# Patient Record
Sex: Male | Born: 1945 | Race: Black or African American | Hispanic: No | Marital: Married | Smoking: Former smoker
Health system: Southern US, Community
[De-identification: ages and names within clinical notes are randomized; demographics above are authoritative.]

## PROBLEM LIST (undated history)

## (undated) DIAGNOSIS — I5022 Chronic systolic (congestive) heart failure: Secondary | ICD-10-CM

## (undated) DIAGNOSIS — M109 Gout, unspecified: Secondary | ICD-10-CM

## (undated) DIAGNOSIS — I482 Chronic atrial fibrillation, unspecified: Secondary | ICD-10-CM

## (undated) DIAGNOSIS — N183 Chronic kidney disease, stage 3 unspecified: Secondary | ICD-10-CM

## (undated) DIAGNOSIS — I10 Essential (primary) hypertension: Secondary | ICD-10-CM

## (undated) DIAGNOSIS — E119 Type 2 diabetes mellitus without complications: Secondary | ICD-10-CM

## (undated) DIAGNOSIS — E785 Hyperlipidemia, unspecified: Secondary | ICD-10-CM

## (undated) DIAGNOSIS — J9621 Acute and chronic respiratory failure with hypoxia: Secondary | ICD-10-CM

## (undated) HISTORY — DX: Essential (primary) hypertension: I10

## (undated) HISTORY — DX: Gout, unspecified: M10.9

## (undated) HISTORY — DX: Hyperlipidemia, unspecified: E78.5

## (undated) HISTORY — DX: Type 2 diabetes mellitus without complications: E11.9

---

## 2015-11-27 DIAGNOSIS — I5020 Unspecified systolic (congestive) heart failure: Secondary | ICD-10-CM | POA: Insufficient documentation

## 2015-11-27 DIAGNOSIS — I10 Essential (primary) hypertension: Secondary | ICD-10-CM | POA: Insufficient documentation

## 2015-11-27 DIAGNOSIS — I2510 Atherosclerotic heart disease of native coronary artery without angina pectoris: Secondary | ICD-10-CM | POA: Diagnosis present

## 2015-11-27 DIAGNOSIS — N183 Chronic kidney disease, stage 3 unspecified: Secondary | ICD-10-CM | POA: Insufficient documentation

## 2015-11-27 DIAGNOSIS — Z95810 Presence of automatic (implantable) cardiac defibrillator: Secondary | ICD-10-CM | POA: Insufficient documentation

## 2015-11-27 DIAGNOSIS — E1122 Type 2 diabetes mellitus with diabetic chronic kidney disease: Secondary | ICD-10-CM | POA: Insufficient documentation

## 2017-12-03 DIAGNOSIS — R609 Edema, unspecified: Secondary | ICD-10-CM | POA: Insufficient documentation

## 2017-12-03 DIAGNOSIS — Z950 Presence of cardiac pacemaker: Secondary | ICD-10-CM | POA: Insufficient documentation

## 2018-07-13 DIAGNOSIS — I4589 Other specified conduction disorders: Secondary | ICD-10-CM | POA: Insufficient documentation

## 2018-07-13 DIAGNOSIS — I5033 Acute on chronic diastolic (congestive) heart failure: Secondary | ICD-10-CM

## 2018-07-13 DIAGNOSIS — I472 Ventricular tachycardia, unspecified: Secondary | ICD-10-CM | POA: Insufficient documentation

## 2018-07-13 DIAGNOSIS — I420 Dilated cardiomyopathy: Secondary | ICD-10-CM | POA: Insufficient documentation

## 2018-07-13 DIAGNOSIS — I4892 Unspecified atrial flutter: Secondary | ICD-10-CM | POA: Diagnosis present

## 2018-07-13 DIAGNOSIS — I255 Ischemic cardiomyopathy: Secondary | ICD-10-CM | POA: Insufficient documentation

## 2018-07-13 DIAGNOSIS — I4891 Unspecified atrial fibrillation: Secondary | ICD-10-CM | POA: Diagnosis present

## 2018-07-13 DIAGNOSIS — T82198A Other mechanical complication of other cardiac electronic device, initial encounter: Secondary | ICD-10-CM | POA: Insufficient documentation

## 2018-09-28 DIAGNOSIS — K047 Periapical abscess without sinus: Secondary | ICD-10-CM | POA: Insufficient documentation

## 2018-09-28 DIAGNOSIS — N183 Chronic kidney disease, stage 3 unspecified: Secondary | ICD-10-CM | POA: Insufficient documentation

## 2020-02-02 LAB — PACEMAKER DEVICE OBSERVATION

## 2020-02-04 DIAGNOSIS — E782 Mixed hyperlipidemia: Secondary | ICD-10-CM | POA: Insufficient documentation

## 2020-02-04 DIAGNOSIS — N493 Fournier gangrene: Secondary | ICD-10-CM | POA: Insufficient documentation

## 2020-02-04 DIAGNOSIS — M726 Necrotizing fasciitis: Secondary | ICD-10-CM | POA: Insufficient documentation

## 2020-02-29 ENCOUNTER — Other Ambulatory Visit (HOSPITAL_COMMUNITY): Payer: Medicare Other

## 2020-02-29 ENCOUNTER — Inpatient Hospital Stay
Admission: RE | Admit: 2020-02-29 | Discharge: 2020-04-20 | Discharge status: Against Medical Advice | Payer: Medicare Other | Source: Other Acute Inpatient Hospital

## 2020-02-29 DIAGNOSIS — J9600 Acute respiratory failure, unspecified whether with hypoxia or hypercapnia: Secondary | ICD-10-CM

## 2020-02-29 DIAGNOSIS — I5022 Chronic systolic (congestive) heart failure: Secondary | ICD-10-CM | POA: Diagnosis present

## 2020-02-29 DIAGNOSIS — N183 Chronic kidney disease, stage 3 unspecified: Secondary | ICD-10-CM | POA: Diagnosis present

## 2020-02-29 DIAGNOSIS — Z452 Encounter for adjustment and management of vascular access device: Secondary | ICD-10-CM

## 2020-02-29 DIAGNOSIS — J90 Pleural effusion, not elsewhere classified: Secondary | ICD-10-CM

## 2020-02-29 DIAGNOSIS — Z431 Encounter for attention to gastrostomy: Secondary | ICD-10-CM

## 2020-02-29 DIAGNOSIS — I482 Chronic atrial fibrillation, unspecified: Secondary | ICD-10-CM | POA: Diagnosis present

## 2020-02-29 DIAGNOSIS — Z9889 Other specified postprocedural states: Secondary | ICD-10-CM

## 2020-02-29 DIAGNOSIS — J189 Pneumonia, unspecified organism: Secondary | ICD-10-CM

## 2020-02-29 DIAGNOSIS — E872 Acidosis, unspecified: Secondary | ICD-10-CM | POA: Insufficient documentation

## 2020-02-29 DIAGNOSIS — E1110 Type 2 diabetes mellitus with ketoacidosis without coma: Secondary | ICD-10-CM | POA: Insufficient documentation

## 2020-02-29 DIAGNOSIS — J9690 Respiratory failure, unspecified, unspecified whether with hypoxia or hypercapnia: Secondary | ICD-10-CM

## 2020-02-29 DIAGNOSIS — J9621 Acute and chronic respiratory failure with hypoxia: Secondary | ICD-10-CM | POA: Diagnosis present

## 2020-02-29 DIAGNOSIS — J939 Pneumothorax, unspecified: Secondary | ICD-10-CM

## 2020-02-29 DIAGNOSIS — T85598A Other mechanical complication of other gastrointestinal prosthetic devices, implants and grafts, initial encounter: Secondary | ICD-10-CM

## 2020-02-29 HISTORY — DX: Chronic atrial fibrillation, unspecified: I48.20

## 2020-02-29 HISTORY — DX: Chronic systolic (congestive) heart failure: I50.22

## 2020-02-29 HISTORY — DX: Chronic kidney disease, stage 3 unspecified: N18.30

## 2020-02-29 HISTORY — DX: Acute and chronic respiratory failure with hypoxia: J96.21

## 2020-02-29 MED ORDER — IOPAMIDOL (ISOVUE-300) INJECTION 61%: 30.0000 mL | Freq: Once | INTRAVENOUS | Status: DC | PRN

## 2020-03-01 LAB — CBC WITH DIFFERENTIAL/PLATELET
Abs Immature Granulocytes: 0.04 10*3/uL (ref 0.00–0.07)
Basophils Absolute: 0 10*3/uL (ref 0.0–0.1)
Basophils Relative: 1 %
Eosinophils Absolute: 0.3 10*3/uL (ref 0.0–0.5)
Eosinophils Relative: 5 %
HCT: 28.1 % — ABNORMAL LOW (ref 39.0–52.0)
Hemoglobin: 8.8 g/dL — ABNORMAL LOW (ref 13.0–17.0)
Immature Granulocytes: 1 %
Lymphocytes Relative: 21 %
Lymphs Abs: 1.2 10*3/uL (ref 0.7–4.0)
MCH: 30.6 pg (ref 26.0–34.0)
MCHC: 31.3 g/dL (ref 30.0–36.0)
MCV: 97.6 fL (ref 80.0–100.0)
Monocytes Absolute: 0.9 10*3/uL (ref 0.1–1.0)
Monocytes Relative: 16 %
Neutro Abs: 3.1 10*3/uL (ref 1.7–7.7)
Neutrophils Relative %: 56 %
Platelets: 403 10*3/uL — ABNORMAL HIGH (ref 150–400)
RBC: 2.88 MIL/uL — ABNORMAL LOW (ref 4.22–5.81)
RDW: 17.5 % — ABNORMAL HIGH (ref 11.5–15.5)
WBC: 5.5 10*3/uL (ref 4.0–10.5)
nRBC: 0 % (ref 0.0–0.2)

## 2020-03-01 LAB — COMPREHENSIVE METABOLIC PANEL
ALT: 28 U/L (ref 0–44)
AST: 30 U/L (ref 15–41)
Albumin: 2.2 g/dL — ABNORMAL LOW (ref 3.5–5.0)
Alkaline Phosphatase: 128 U/L — ABNORMAL HIGH (ref 38–126)
Anion gap: 13 (ref 5–15)
BUN: 28 mg/dL — ABNORMAL HIGH (ref 8–23)
CO2: 21 mmol/L — ABNORMAL LOW (ref 22–32)
Calcium: 9 mg/dL (ref 8.9–10.3)
Chloride: 103 mmol/L (ref 98–111)
Creatinine, Ser: 1.71 mg/dL — ABNORMAL HIGH (ref 0.61–1.24)
GFR, Estimated: 42 mL/min — ABNORMAL LOW (ref >60)
Glucose, Bld: 128 mg/dL — ABNORMAL HIGH (ref 70–99)
Potassium: 3.3 mmol/L — ABNORMAL LOW (ref 3.5–5.1)
Sodium: 137 mmol/L (ref 135–145)
Total Bilirubin: 0.7 mg/dL (ref 0.3–1.2)
Total Protein: 5.8 g/dL — ABNORMAL LOW (ref 6.5–8.1)

## 2020-03-01 LAB — HEMOGLOBIN A1C
Hgb A1c MFr Bld: 9.3 % — ABNORMAL HIGH (ref 4.8–5.6)
Mean Plasma Glucose: 220.21 mg/dL

## 2020-03-04 LAB — CBC
HCT: 27.8 % — ABNORMAL LOW (ref 39.0–52.0)
Hemoglobin: 8.9 g/dL — ABNORMAL LOW (ref 13.0–17.0)
MCH: 31.7 pg (ref 26.0–34.0)
MCHC: 32 g/dL (ref 30.0–36.0)
MCV: 98.9 fL (ref 80.0–100.0)
Platelets: 373 10*3/uL (ref 150–400)
RBC: 2.81 MIL/uL — ABNORMAL LOW (ref 4.22–5.81)
RDW: 19.4 % — ABNORMAL HIGH (ref 11.5–15.5)
WBC: 9.1 10*3/uL (ref 4.0–10.5)
nRBC: 0.9 % — ABNORMAL HIGH (ref 0.0–0.2)

## 2020-03-04 LAB — BASIC METABOLIC PANEL
Anion gap: 9 (ref 5–15)
BUN: 17 mg/dL (ref 8–23)
CO2: 23 mmol/L (ref 22–32)
Calcium: 8.6 mg/dL — ABNORMAL LOW (ref 8.9–10.3)
Chloride: 106 mmol/L (ref 98–111)
Creatinine, Ser: 1.77 mg/dL — ABNORMAL HIGH (ref 0.61–1.24)
GFR, Estimated: 40 mL/min — ABNORMAL LOW (ref >60)
Glucose, Bld: 150 mg/dL — ABNORMAL HIGH (ref 70–99)
Potassium: 4.1 mmol/L (ref 3.5–5.1)
Sodium: 138 mmol/L (ref 135–145)

## 2020-03-07 LAB — CBC
HCT: 28.2 % — ABNORMAL LOW (ref 39.0–52.0)
Hemoglobin: 8.9 g/dL — ABNORMAL LOW (ref 13.0–17.0)
MCH: 31.3 pg (ref 26.0–34.0)
MCHC: 31.6 g/dL (ref 30.0–36.0)
MCV: 99.3 fL (ref 80.0–100.0)
Platelets: 363 10*3/uL (ref 150–400)
RBC: 2.84 MIL/uL — ABNORMAL LOW (ref 4.22–5.81)
RDW: 20.7 % — ABNORMAL HIGH (ref 11.5–15.5)
WBC: 10.8 10*3/uL — ABNORMAL HIGH (ref 4.0–10.5)
nRBC: 0.6 % — ABNORMAL HIGH (ref 0.0–0.2)

## 2020-03-07 LAB — BASIC METABOLIC PANEL
Anion gap: 8 (ref 5–15)
BUN: 17 mg/dL (ref 8–23)
CO2: 24 mmol/L (ref 22–32)
Calcium: 8.3 mg/dL — ABNORMAL LOW (ref 8.9–10.3)
Chloride: 106 mmol/L (ref 98–111)
Creatinine, Ser: 1.68 mg/dL — ABNORMAL HIGH (ref 0.61–1.24)
GFR, Estimated: 43 mL/min — ABNORMAL LOW (ref >60)
Glucose, Bld: 90 mg/dL (ref 70–99)
Potassium: 3.4 mmol/L — ABNORMAL LOW (ref 3.5–5.1)
Sodium: 138 mmol/L (ref 135–145)

## 2020-03-08 LAB — POTASSIUM: Potassium: 3.9 mmol/L (ref 3.5–5.1)

## 2020-03-13 DIAGNOSIS — S31109D Unspecified open wound of abdominal wall, unspecified quadrant without penetration into peritoneal cavity, subsequent encounter: Secondary | ICD-10-CM

## 2020-03-13 LAB — BASIC METABOLIC PANEL
Anion gap: 9 (ref 5–15)
BUN: 11 mg/dL (ref 8–23)
CO2: 24 mmol/L (ref 22–32)
Calcium: 8.8 mg/dL — ABNORMAL LOW (ref 8.9–10.3)
Chloride: 107 mmol/L (ref 98–111)
Creatinine, Ser: 1.76 mg/dL — ABNORMAL HIGH (ref 0.61–1.24)
GFR, Estimated: 41 mL/min — ABNORMAL LOW (ref >60)
Glucose, Bld: 80 mg/dL (ref 70–99)
Potassium: 3.4 mmol/L — ABNORMAL LOW (ref 3.5–5.1)
Sodium: 140 mmol/L (ref 135–145)

## 2020-03-13 LAB — CBC
HCT: 32.7 % — ABNORMAL LOW (ref 39.0–52.0)
Hemoglobin: 10.2 g/dL — ABNORMAL LOW (ref 13.0–17.0)
MCH: 30.7 pg (ref 26.0–34.0)
MCHC: 31.2 g/dL (ref 30.0–36.0)
MCV: 98.5 fL (ref 80.0–100.0)
Platelets: 414 10*3/uL — ABNORMAL HIGH (ref 150–400)
RBC: 3.32 MIL/uL — ABNORMAL LOW (ref 4.22–5.81)
RDW: 19.1 % — ABNORMAL HIGH (ref 11.5–15.5)
WBC: 9.5 10*3/uL (ref 4.0–10.5)
nRBC: 0 % (ref 0.0–0.2)

## 2020-03-13 NOTE — H&P (View-Only) (Signed)
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 He had been transported by EMS to the ER with complaints of penile bleeding, testicular pain, and syncope. EMS noted the patient was hypotensive and confused and had Vtach on the way to the ER. Patient was septic; Lactic Acid 7.8, glucose > 800, anion gap of 30. Patient was started on Zosyn and Vancomycin. CT scan showed fournier gangrene and patient had multiple debridements. Patient required transfusion of packed RBC.  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  No past medical history on file.  No family history on file.  Social History:  has no history on file for tobacco use, alcohol use, and drug use.  Allergies: Not on File  Medications:   Results for orders placed or performed during the hospital encounter of 02/29/20 (from the past 48 hour(s))  CBC     Status: Abnormal   Collection Time: 03/13/20  3:32 AM  Result Value Ref Range   WBC 9.5 4.0 - 10.5 K/uL   RBC 3.32 (L) 4.22 - 5.81 MIL/uL   Hemoglobin 10.2 (L) 13.0 - 17.0 g/dL   HCT 32.7 (L) 39.0 - 52.0 %   MCV 98.5 80.0 - 100.0 fL   MCH 30.7 26.0 - 34.0 pg   MCHC 31.2 30.0 - 36.0 g/dL   RDW 19.1 (H) 11.5 - 15.5 %   Platelets 414 (H) 150 - 400 K/uL   nRBC 0.0 0.0 - 0.2 %    Comment: Performed at [facility], [address] [place], [place] [address]  Basic metabolic panel     Status: Abnormal   Collection Time: 03/13/20  3:32 AM  Result Value Ref Range   Sodium 140 135 - 145 mmol/L   Potassium 3.4 (L) 3.5 - 5.1 mmol/L   Chloride 107 98 - 111 mmol/L   CO2 24 22  - 32 mmol/L   Glucose, Bld 80 70 - 99 mg/dL    Comment: Glucose reference range applies only to samples taken after fasting for at least 8 hours.   BUN 11 8 - 23 mg/dL   Creatinine, Ser 1.76 (H) 0.61 - 1.24 mg/dL   Calcium 8.8 (L) 8.9 - 10.3 mg/dL   GFR, Estimated 41 (L) >60 mL/min    Comment: (NOTE) Calculated using the CKD-EPI Creatinine Equation (2021)    Anion gap 9 5 - 15    Comment: Performed at [facility] [address], [place], [place] [address]    Physical Exam Constitutional:      General: He is not in acute distress. Genitourinary:    Comments: Photos from today's dressing change provided by wound care: Wound extends across groin area including both testes, penis shaft,  perineum and right gluteal fold. Healthy pink tissue covering majority of the wound.  Neurological:     Mental Status: He is alert.     Assessment/Plan: Discussed case with Plastics team. Recommendation to take patient to OR for partial closure and application of dermal matrix or Skin Graft.   Will discuss with Care team and patient. Will need Care team to obtain surgical clearance for patient. Patient will  need to be NPO starting midnight before surgery.   Threasa Heads, PA-C 03/13/2020, 6:45 PM

## 2020-03-13 NOTE — Consult Note (Addendum)
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 He had been transported by EMS to the ER with complaints of penile bleeding, testicular pain, and syncope. EMS noted the patient was hypotensive and confused and had Vtach on the way to the ER. Patient was septic; Lactic Acid 7.8, glucose > 800, anion gap of 30. Patient was started on Zosyn and Vancomycin. CT scan showed fournier gangrene and patient had multiple debridements. Patient required transfusion of packed RBC.  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  No past medical history on file.  No family history on file.  Social History:  has no history on file for tobacco use, alcohol use, and drug use.  Allergies: Not on File  Medications:   Results for orders placed or performed during the hospital encounter of 02/29/20 (from the past 48 hour(s))  CBC     Status: Abnormal   Collection Time: 03/13/20  3:32 AM  Result Value Ref Range   WBC 9.5 4.0 - 10.5 K/uL   RBC 3.32 (L) 4.22 - 5.81 MIL/uL   Hemoglobin 10.2 (L) 13.0 - 17.0 g/dL   HCT 32.7 (L) 39.0 - 52.0 %   MCV 98.5 80.0 - 100.0 fL   MCH 30.7 26.0 - 34.0 pg   MCHC 31.2 30.0 - 36.0 g/dL   RDW 19.1 (H) 11.5 - 15.5 %   Platelets 414 (H) 150 - 400 K/uL   nRBC 0.0 0.0 - 0.2 %    Comment: Performed at [facility], [address], [place], [place] [address]  Basic metabolic panel     Status: Abnormal   Collection Time: 03/13/20  3:32 AM  Result Value Ref Range   Sodium 140 135 - 145 mmol/L   Potassium 3.4 (L) 3.5 - 5.1 mmol/L   Chloride 107 98 - 111 mmol/L   CO2 24 22  - 32 mmol/L   Glucose, Bld 80 70 - 99 mg/dL    Comment: Glucose reference range applies only to samples taken after fasting for at least 8 hours.   BUN 11 8 - 23 mg/dL   Creatinine, Ser 1.76 (H) 0.61 - 1.24 mg/dL   Calcium 8.8 (L) 8.9 - 10.3 mg/dL   GFR, Estimated 41 (L) >60 mL/min    Comment: (NOTE) Calculated using the CKD-EPI Creatinine Equation (2021)    Anion gap 9 5 - 15    Comment: Performed at [facility] [address], [place], [place] [address]    Physical Exam Constitutional:      General: He is not in acute distress. Genitourinary:    Comments: Photos from today's dressing change provided by wound care: Wound extends across groin area including both testes, penis shaft,  perineum and right gluteal fold. Healthy pink tissue covering majority of the wound.  Neurological:     Mental Status: He is alert.     Assessment/Plan: Discussed case with Plastics team. Recommendation to take patient to OR for partial closure and application of dermal matrix or Skin Graft.   Will discuss with Care team and patient. Will need Care team to obtain surgical clearance for patient. Patient will  need to be NPO starting midnight before surgery.   Threasa Heads, PA-C 03/13/2020, 6:45 PM

## 2020-03-15 NOTE — Progress Notes (Signed)
  Subjective: Patient is a 74 year old male who was admitted to the [facility] on 02/29/2020 with septic shock secondary to Fournier's gangrene.  Patient reports today that he is a diabetic, reports that his A1c was around 6.5 and therefore he stopped taking his insulin.  Most recent A1c 9.3.  Patient has significant past medical history of CKD stage III, DM with A1c of 9.3, hypertension, hyperlipidemia, CAD with stent placement and ischemic dilated cardiomyopathy.  Objective: Vital signs in last 24 hours: BP: ()/()  Arterial Line BP: ()/()     Intake/Output from previous day: No intake/output data recorded. Intake/Output this shift: No intake/output data recorded.  General appearance: alert, cooperative, no distress and Elderly male resting in bed Head: Normocephalic, without obvious abnormality, atraumatic Resp: Unlabored Incision/Wound: Did not evaluate patient's wound today  Lab Results:  CBC Latest Ref Rng & Units 03/13/2020 03/07/2020 03/04/2020  WBC 4.0 - 10.5 K/uL 9.5 10.8(H) 9.1  Hemoglobin 13.0 - 17.0 g/dL 10.2(L) 8.9(L) 8.9(L)  Hematocrit 39.0 - 52.0 % 32.7(L) 28.2(L) 27.8(L)  Platelets 150 - 400 K/uL 414(H) 363 373    BMET Recent Labs    03/13/20 0332  NA 140  K 3.4*  CL 107  CO2 24  GLUCOSE 80  BUN 11  CREATININE 1.76*  CALCIUM 8.8*   PT/INR No results for input(s): LABPROT, INR in the last 72 hours. ABG No results for input(s): PHART, HCO3 in the last 72 hours.  Invalid input(s): PCO2, PO2  Studies/Results: No results found.  Anti-infectives: Anti-infectives (From admission, onward)   None      Assessment/Plan: Fournier grangrene/scrotal wound:  Discussed surgical options with patient, including wound debridement, placement of wound VAC, partial primary closure, application of wound matrix, split-thickness skin graft.  Discussed the surgical plan with patient and his wife.  Discussed the risks associated with each procedure  including but not limited to bleeding, pain, infection, risks of anesthesia.  All of the patient's questions were answered to his satisfaction.  Discussed with the primary team Dr. Laren Everts and nursing staff that we would require surgical clearance prior to any surgical intervention.  Discussed with them that we would also determine intraoperatively the best plan of action, however I did discuss with them the possibilities including debridement, primary partial closure, application of wound matrix, split-thickness skin graft.    Patient states that he would like to think it over, he would like to talk with his wife more.  I recommend he call us with any questions or concerns.  I discussed with him that we will check in next week to follow-up.  I discussed with him that in the meantime we would plan a date for his surgical intervention and adjust as needed.  DVT prophylaxis per primary team, FEN Per primary team. Will update patient and primary team with surgical date once it has been set.     LOS: 0 days    Charlies Constable, PA-C 03/15/2020

## 2020-03-18 LAB — CBC
HCT: 32.8 % — ABNORMAL LOW (ref 39.0–52.0)
Hemoglobin: 10.2 g/dL — ABNORMAL LOW (ref 13.0–17.0)
MCH: 30.6 pg (ref 26.0–34.0)
MCHC: 31.1 g/dL (ref 30.0–36.0)
MCV: 98.5 fL (ref 80.0–100.0)
Platelets: 392 10*3/uL (ref 150–400)
RBC: 3.33 MIL/uL — ABNORMAL LOW (ref 4.22–5.81)
RDW: 18.2 % — ABNORMAL HIGH (ref 11.5–15.5)
WBC: 8.7 10*3/uL (ref 4.0–10.5)
nRBC: 0 % (ref 0.0–0.2)

## 2020-03-18 LAB — BASIC METABOLIC PANEL
Anion gap: 8 (ref 5–15)
BUN: 14 mg/dL (ref 8–23)
CO2: 26 mmol/L (ref 22–32)
Calcium: 8.9 mg/dL (ref 8.9–10.3)
Chloride: 105 mmol/L (ref 98–111)
Creatinine, Ser: 1.66 mg/dL — ABNORMAL HIGH (ref 0.61–1.24)
GFR, Estimated: 44 mL/min — ABNORMAL LOW (ref >60)
Glucose, Bld: 60 mg/dL — ABNORMAL LOW (ref 70–99)
Potassium: 3.9 mmol/L (ref 3.5–5.1)
Sodium: 139 mmol/L (ref 135–145)

## 2020-03-18 LAB — URIC ACID: Uric Acid, Serum: 6.5 mg/dL (ref 3.7–8.6)

## 2020-03-20 NOTE — Progress Notes (Signed)
  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  PMH significant for CKD stage III, DM with recent A1c of 9.3, HTN, HLD, CAD with stent placement and ischemic dilated cardiomyopathy..  Objective: Vital signs in last 24 hours: BP: ()/()  Arterial Line BP: ()/()     Intake/Output from previous day: No intake/output data recorded. Intake/Output this shift: No intake/output data recorded.  General appearance: alert, cooperative and no distress Head: Normocephalic, without obvious abnormality, atraumatic Eyes: EOMs intact Resp: nonlabored Incision/Wound: Did not evaluate patient's wound today  Lab Results:  CBC    Component Value Date/Time   WBC 8.7 03/18/2020 0527   RBC 3.33 (L) 03/18/2020 0527   HGB 10.2 (L) 03/18/2020 0527   HCT 32.8 (L) 03/18/2020 0527   PLT 392 03/18/2020 0527   MCV 98.5 03/18/2020 0527   MCH 30.6 03/18/2020 0527   MCHC 31.1 03/18/2020 0527   RDW 18.2 (H) 03/18/2020 0527   LYMPHSABS 1.2 03/01/2020 0442   MONOABS 0.9 03/01/2020 0442   EOSABS 0.3 03/01/2020 0442   BASOSABS 0.0 03/01/2020 0442   BMET Recent Labs    03/18/20 0527  NA 139  K 3.9  CL 105  CO2 26  GLUCOSE 60*  BUN 14  CREATININE 1.66*  CALCIUM 8.9   PT/INR No results for input(s): LABPROT, INR in the last 72 hours. ABG No results for input(s): PHART, HCO3 in the last 72 hours.  Invalid input(s): PCO2, PO2  Studies/Results: No results found.  Anti-infectives: Anti-infectives (From admission, onward)   None      Assessment/Plan: s/p  Patient states that he and his wife are in agreement for moving forward with surgery to address his groin wound. He would like to be placed on the schedule.  We will begin working to schedule him and update his care team accordingly. Patient will need to be NPO at midnight before surgery. Care team will need to obtain appropriate surgical  clearance for patient.   LOS: 0 days    Threasa Heads, PA-C 03/20/2020

## 2020-03-25 ENCOUNTER — Encounter (HOSPITAL_COMMUNITY): Payer: Self-pay | Admitting: Anesthesiology

## 2020-03-25 LAB — PROTIME-INR
INR: 1 (ref 0.8–1.2)
Prothrombin Time: 13.1 seconds (ref 11.4–15.2)

## 2020-03-25 LAB — BASIC METABOLIC PANEL
Anion gap: 9 (ref 5–15)
BUN: 20 mg/dL (ref 8–23)
CO2: 23 mmol/L (ref 22–32)
Calcium: 8.9 mg/dL (ref 8.9–10.3)
Chloride: 110 mmol/L (ref 98–111)
Creatinine, Ser: 1.58 mg/dL — ABNORMAL HIGH (ref 0.61–1.24)
GFR, Estimated: 46 mL/min — ABNORMAL LOW (ref >60)
Glucose, Bld: 73 mg/dL (ref 70–99)
Potassium: 3.2 mmol/L — ABNORMAL LOW (ref 3.5–5.1)
Sodium: 142 mmol/L (ref 135–145)

## 2020-03-25 LAB — CBC
HCT: 34.3 % — ABNORMAL LOW (ref 39.0–52.0)
Hemoglobin: 11.2 g/dL — ABNORMAL LOW (ref 13.0–17.0)
MCH: 30.8 pg (ref 26.0–34.0)
MCHC: 32.7 g/dL (ref 30.0–36.0)
MCV: 94.2 fL (ref 80.0–100.0)
Platelets: 364 10*3/uL (ref 150–400)
RBC: 3.64 MIL/uL — ABNORMAL LOW (ref 4.22–5.81)
RDW: 16.6 % — ABNORMAL HIGH (ref 11.5–15.5)
WBC: 8.4 10*3/uL (ref 4.0–10.5)
nRBC: 0 % (ref 0.0–0.2)

## 2020-03-25 NOTE — Anesthesia Preprocedure Evaluation (Deleted)
Anesthesia Evaluation    Reviewed: Allergy & Precautions, Patient's Chart, lab work & pertinent test results  History of Anesthesia Complications Negative for: history of anesthetic complications  Airway        Dental   Pulmonary neg pulmonary ROS,           Cardiovascular + CAD, + Cardiac Stents and +CHF  + dysrhythmias Atrial Fibrillation   TTE 02/12/20: study quality technically difficult, moderately dilated LV, anteroapical wall appears thin and akinetic, EF 25-36%, diastolic dysfunction.     Neuro/Psych negative neurological ROS  negative psych ROS   GI/Hepatic negative GI ROS, Neg liver ROS,   Endo/Other  diabetes, Type 2  Renal/GU Renal InsufficiencyRenal disease  negative genitourinary   Musculoskeletal negative musculoskeletal ROS (+)   Abdominal   Peds  Hematology negative hematology ROS (+)   Anesthesia Other Findings fourniers gangrene  Reproductive/Obstetrics negative OB ROS                            Anesthesia Physical Anesthesia Plan  ASA: III  Anesthesia Plan: General   Post-op Pain Management:    Induction: Intravenous  PONV Risk Score and Plan: 3 and Treatment may vary due to age or medical condition and Ondansetron  Airway Management Planned: Oral ETT  Additional Equipment: Arterial line  Intra-op Plan:   Post-operative Plan: Extubation in OR  Informed Consent:   Plan Discussed with:   Anesthesia Plan Comments:        Anesthesia Quick Evaluation

## 2020-03-26 ENCOUNTER — Encounter: Admission: RE | Discharge status: Against Medical Advice | Payer: Self-pay | Source: Other Acute Inpatient Hospital

## 2020-03-26 LAB — POTASSIUM: Potassium: 3.5 mmol/L (ref 3.5–5.1)

## 2020-03-26 LAB — SARS CORONAVIRUS 2 (TAT 6-24 HRS): SARS Coronavirus 2: NEGATIVE

## 2020-03-26 SURGERY — WOUND EXPLORATION
Anesthesia: General | Site: Groin | Laterality: Bilateral

## 2020-03-26 NOTE — Progress Notes (Signed)
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  He has a significant groin wound which they have been treating with local wound care.  Patient was scheduled for surgery today, however patient was not n.p.o. and family had some questions about surgery and therefore it was postponed.  Today on evaluation, patient is resting in bed, no complaints, wife on phone.  Nursing staff at bedside.  Objective: Vital signs in last 24 hours: BP: ()/()  Arterial Line BP: ()/()     Intake/Output from previous day: No intake/output data recorded. Intake/Output this shift: No intake/output data recorded.  General appearance: alert, cooperative, no distress and Sitting up in bed, resting, comfortable Head: Normocephalic, without obvious abnormality, atraumatic Respiratory: Unlabored. Incision/Wound: Did not evaluate groin wound today  Lab Results:  CBC Latest Ref Rng & Units 03/25/2020 03/18/2020 03/13/2020  WBC 4.0 - 10.5 K/uL 8.4 8.7 9.5  Hemoglobin 13.0 - 17.0 g/dL 11.2(L) 10.2(L) 10.2(L)  Hematocrit 39.0 - 52.0 % 34.3(L) 32.8(L) 32.7(L)  Platelets 150 - 400 K/uL 364 392 414(H)    BMET Recent Labs    03/25/20 0354 03/26/20 0458  NA 142  --   K 3.2* 3.5  CL 110  --   CO2 23  --   GLUCOSE 73  --   BUN 20  --   CREATININE 1.58*  --   CALCIUM 8.9  --    PT/INR Recent Labs    03/25/20 0354  LABPROT 13.1  INR 1.0   ABG No results for input(s): PHART, HCO3 in the last 72 hours.  Invalid input(s): PCO2, PO2  Studies/Results: No results found.  Anti-infectives: Anti-infectives (From admission, onward)   None      Assessment/Plan:  Patient is scheduled for surgery tomorrow with Dr. Claudia Desanctis: Exploration bilateral groin wounds with partial closure, possible application of PRIMATRIX, possible split-thickness skin graft  N.p.o. after midnight, discussed this  [facility].  Patient is aware to be n.p.o. after midnight.  Primary team to obtain clearance prior to surgical intervention.  Discussed holding Lovenox dose this afternoon and tomorrow [facility]  We discussed the risks associated with the scheduled surgery including but not limited to bleeding, infection, poor healing, cardiovascular and pulmonary complications, prolonged wound healing.  We discussed his personal risk factors for poor wound healing including but not limited to his elevated A1c.  All the patient and family members questions were answered to their content.  They stated they are prepared to move forward with surgery.  I recommend they call us with any questions or concerns.    LOS: 0 days    Charlies Constable, PA-C 03/26/2020

## 2020-03-27 ENCOUNTER — Encounter (HOSPITAL_COMMUNITY): Payer: Self-pay

## 2020-03-27 ENCOUNTER — Encounter (HOSPITAL_COMMUNITY): Payer: Medicare Other | Admitting: Certified Registered"

## 2020-03-27 ENCOUNTER — Encounter: Admission: RE | Discharge status: Against Medical Advice | Payer: Self-pay | Source: Other Acute Inpatient Hospital

## 2020-03-27 DIAGNOSIS — S31501A Unspecified open wound of unspecified external genital organs, male, initial encounter: Secondary | ICD-10-CM

## 2020-03-27 HISTORY — PX: WOUND EXPLORATION: SHX6188

## 2020-03-27 HISTORY — PX: SKIN SPLIT GRAFT: SHX444

## 2020-03-27 LAB — GLUCOSE, CAPILLARY
Glucose-Capillary: 80 mg/dL (ref 70–99)
Glucose-Capillary: 61 mg/dL — ABNORMAL LOW (ref 70–99)
Glucose-Capillary: 105 mg/dL — ABNORMAL HIGH (ref 70–99)
Glucose-Capillary: 78 mg/dL (ref 70–99)

## 2020-03-27 SURGERY — WOUND EXPLORATION
Anesthesia: General | Site: Groin | Laterality: Bilateral

## 2020-03-27 MED ORDER — BUPIVACAINE HCL (PF) 0.25 % IJ SOLN
INTRAMUSCULAR | Status: AC
  Filled 2020-03-27: qty 30

## 2020-03-27 MED ORDER — MINERAL OIL LIGHT 100 % EX OIL
TOPICAL_OIL | CUTANEOUS | Status: DC | PRN
  Administered 2020-03-27: 1 via TOPICAL

## 2020-03-27 MED ORDER — 0.9 % SODIUM CHLORIDE (POUR BTL) OPTIME
TOPICAL | Status: DC | PRN
  Administered 2020-03-27: 1000 mL

## 2020-03-27 MED ORDER — FENTANYL CITRATE (PF) 250 MCG/5ML IJ SOLN
INTRAMUSCULAR | Status: AC
  Filled 2020-03-27: qty 5

## 2020-03-27 MED ORDER — LACTATED RINGERS IV SOLN: INTRAVENOUS | Status: DC

## 2020-03-27 MED ORDER — CEFAZOLIN SODIUM-DEXTROSE 2-4 GM/100ML-% IV SOLN
2.0000 g | INTRAVENOUS | Status: AC
  Administered 2020-03-27: 2 g via INTRAVENOUS
  Filled 2020-03-27: qty 100

## 2020-03-27 MED ORDER — ORAL CARE MOUTH RINSE: 15.0000 mL | Freq: Once | OROMUCOSAL | Status: AC

## 2020-03-27 MED ORDER — PROPOFOL 10 MG/ML IV BOLUS
INTRAVENOUS | Status: AC
  Filled 2020-03-27: qty 20

## 2020-03-27 MED ORDER — FENTANYL CITRATE (PF) 100 MCG/2ML IJ SOLN: 25.0000 ug | INTRAMUSCULAR | Status: DC | PRN

## 2020-03-27 MED ORDER — PROMETHAZINE HCL 25 MG/ML IJ SOLN: 6.2500 mg | INTRAMUSCULAR | Status: DC | PRN

## 2020-03-27 MED ORDER — LIDOCAINE 2% (20 MG/ML) 5 ML SYRINGE
INTRAMUSCULAR | Status: DC | PRN
  Administered 2020-03-27: 40 mg via INTRAVENOUS

## 2020-03-27 MED ORDER — PROPOFOL 10 MG/ML IV BOLUS
INTRAVENOUS | Status: DC | PRN
  Administered 2020-03-27: 120 mg via INTRAVENOUS

## 2020-03-27 MED ORDER — FENTANYL CITRATE (PF) 100 MCG/2ML IJ SOLN
INTRAMUSCULAR | Status: DC | PRN
  Administered 2020-03-27 (×5): 25 ug via INTRAVENOUS

## 2020-03-27 MED ORDER — OXYCODONE HCL 5 MG PO TABS: 5.0000 mg | ORAL_TABLET | Freq: Once | ORAL | Status: DC | PRN

## 2020-03-27 MED ORDER — EPHEDRINE SULFATE 50 MG/ML IJ SOLN
INTRAMUSCULAR | Status: DC | PRN
  Administered 2020-03-27: 5 mg via INTRAVENOUS

## 2020-03-27 MED ORDER — EPINEPHRINE PF 1 MG/ML IJ SOLN
INTRAMUSCULAR | Status: AC
  Filled 2020-03-27: qty 1

## 2020-03-27 MED ORDER — FENTANYL CITRATE (PF) 100 MCG/2ML IJ SOLN
INTRAMUSCULAR | Status: AC
  Filled 2020-03-27: qty 2

## 2020-03-27 MED ORDER — CHLORHEXIDINE GLUCONATE 0.12 % MT SOLN
15.0000 mL | Freq: Once | OROMUCOSAL | Status: AC
  Administered 2020-03-27: 15 mL via OROMUCOSAL
  Filled 2020-03-27: qty 15

## 2020-03-27 MED ORDER — ONDANSETRON HCL 4 MG/2ML IJ SOLN: 4.0000 mg | Freq: Once | INTRAMUSCULAR | Status: DC | PRN

## 2020-03-27 MED ORDER — PHENYLEPHRINE HCL-NACL 10-0.9 MG/250ML-% IV SOLN
INTRAVENOUS | Status: DC | PRN
  Administered 2020-03-27: 30 ug/min via INTRAVENOUS

## 2020-03-27 MED ORDER — OXYCODONE HCL 5 MG/5ML PO SOLN: 5.0000 mg | Freq: Once | ORAL | Status: DC | PRN

## 2020-03-27 MED ORDER — MINERAL OIL LIGHT OIL
TOPICAL_OIL | Freq: Once | Status: DC
  Filled 2020-03-27: qty 10

## 2020-03-27 MED ORDER — PHENYLEPHRINE HCL (PRESSORS) 10 MG/ML IV SOLN
INTRAVENOUS | Status: DC | PRN
  Administered 2020-03-27 (×7): 80 ug via INTRAVENOUS

## 2020-03-27 MED ORDER — ONDANSETRON HCL 4 MG/2ML IJ SOLN
INTRAMUSCULAR | Status: DC | PRN
  Administered 2020-03-27: 4 mg via INTRAVENOUS

## 2020-03-27 MED ORDER — SODIUM CHLORIDE 0.9 % IV SOLN
INTRAVENOUS | Status: DC | PRN
  Administered 2020-03-27: 1031 mL

## 2020-03-27 MED ORDER — DEXTROSE 50 % IV SOLN
INTRAVENOUS | Status: DC | PRN
  Administered 2020-03-27: 12.5 g via INTRAVENOUS

## 2020-03-27 MED ORDER — FENTANYL CITRATE (PF) 100 MCG/2ML IJ SOLN
25.0000 ug | INTRAMUSCULAR | Status: DC | PRN
  Administered 2020-03-27 (×2): 25 ug via INTRAVENOUS

## 2020-03-27 SURGICAL SUPPLY — 61 items
BAG DECANTER FOR FLEXI CONT (MISCELLANEOUS) ×2 IMPLANT
BLADE CLIPPER SURG (BLADE) IMPLANT
BLADE DERMATOME SS (BLADE) ×2 IMPLANT
BNDG ELASTIC 4X5.8 VLCR STR LF (GAUZE/BANDAGES/DRESSINGS) IMPLANT
BNDG ELASTIC 6X5.8 VLCR STR LF (GAUZE/BANDAGES/DRESSINGS) IMPLANT
BNDG GAUZE ELAST 4 BULKY (GAUZE/BANDAGES/DRESSINGS) IMPLANT
BRUSH SCRUB EZ PLAIN DRY (MISCELLANEOUS) ×18 IMPLANT
CANISTER SUCT 3000ML PPV (MISCELLANEOUS) IMPLANT
CANISTER WOUND CARE 500ML ATS (WOUND CARE) IMPLANT
COVER SURGICAL LIGHT HANDLE (MISCELLANEOUS) ×2 IMPLANT
DERMACARRIERS GRAFT 1 TO 1.5 (DISPOSABLE) ×4
DRAPE EXTREMITY T 121X128X90 (DISPOSABLE) IMPLANT
DRAPE HALF SHEET 40X57 (DRAPES) ×2 IMPLANT
DRAPE INCISE IOBAN 66X45 STRL (DRAPES) ×2 IMPLANT
DRAPE ORTHO SPLIT 77X108 STRL (DRAPES) ×4
DRAPE SURG ORHT 6 SPLT 77X108 (DRAPES) ×2 IMPLANT
DRSG CALCIUM ALGINATE 4X4 (GAUZE/BANDAGES/DRESSINGS) ×14 IMPLANT
DRSG MEPITEL 4X7.2 (GAUZE/BANDAGES/DRESSINGS) IMPLANT
DRSG OPSITE 6X11 MED (GAUZE/BANDAGES/DRESSINGS) IMPLANT
DRSG PAD ABDOMINAL 8X10 ST (GAUZE/BANDAGES/DRESSINGS) ×8 IMPLANT
DRSG VAC ATS LRG SENSATRAC (GAUZE/BANDAGES/DRESSINGS) IMPLANT
DRSG VAC ATS MED SENSATRAC (GAUZE/BANDAGES/DRESSINGS) IMPLANT
DRSG VAC ATS SM SENSATRAC (GAUZE/BANDAGES/DRESSINGS) IMPLANT
ELECT REM PT RETURN 9FT ADLT (ELECTROSURGICAL) ×2
ELECTRODE REM PT RTRN 9FT ADLT (ELECTROSURGICAL) ×1 IMPLANT
FILTER STRAW FLUID ASPIR (MISCELLANEOUS) ×2 IMPLANT
GAUZE SPONGE 4X4 12PLY STRL (GAUZE/BANDAGES/DRESSINGS) ×8 IMPLANT
GAUZE XEROFORM 5X9 LF (GAUZE/BANDAGES/DRESSINGS) ×14 IMPLANT
GLOVE BIOGEL M STRL SZ7.5 (GLOVE) ×4 IMPLANT
GLOVE INDICATOR 8.0 STRL GRN (GLOVE) ×4 IMPLANT
GOWN STRL REUS W/ TWL LRG LVL3 (GOWN DISPOSABLE) ×2 IMPLANT
GOWN STRL REUS W/TWL LRG LVL3 (GOWN DISPOSABLE) ×4
GRAFT DERMACARRIERS 1 TO 1.5 (DISPOSABLE) ×2 IMPLANT
HANDPIECE INTERPULSE COAX TIP (DISPOSABLE)
IV NS 1000ML (IV SOLUTION) ×2
IV NS 1000ML BAXH (IV SOLUTION) ×1 IMPLANT
KIT BASIN OR (CUSTOM PROCEDURE TRAY) ×2 IMPLANT
KIT TURNOVER KIT B (KITS) ×2 IMPLANT
MANIFOLD NEPTUNE II (INSTRUMENTS) ×2 IMPLANT
NEEDLE 18GX1X1/2 (RX/OR ONLY) (NEEDLE) ×2 IMPLANT
NEEDLE FILTER BLUNT 18X 1/2SAF (NEEDLE) ×1
NEEDLE FILTER BLUNT 18X1 1/2 (NEEDLE) ×1 IMPLANT
NEEDLE SPNL 18GX3.5 QUINCKE PK (NEEDLE) ×2 IMPLANT
NS IRRIG 1000ML POUR BTL (IV SOLUTION) ×2 IMPLANT
PACK GENERAL/GYN (CUSTOM PROCEDURE TRAY) ×2 IMPLANT
PAD ARMBOARD 7.5X6 YLW CONV (MISCELLANEOUS) ×4 IMPLANT
SET HNDPC FAN SPRY TIP SCT (DISPOSABLE) IMPLANT
STAPLER VISISTAT 35W (STAPLE) IMPLANT
SUT CHROMIC 4 0 PS 2 18 (SUTURE) IMPLANT
SUT ETHILON 3 0 PS 1 (SUTURE) ×18 IMPLANT
SUT PDS AB 3-0 SH 27 (SUTURE) IMPLANT
SUT VIC AB 3-0 PS1 18 (SUTURE) ×4
SUT VIC AB 3-0 PS1 18XBRD (SUTURE) ×2 IMPLANT
SUT VIC AB 3-0 PS2 18 (SUTURE) ×38 IMPLANT
SYR 30ML LL (SYRINGE) ×2 IMPLANT
SYR 50ML LL SCALE MARK (SYRINGE) ×6 IMPLANT
SYR CONTROL 10ML LL (SYRINGE) ×2 IMPLANT
SYR TB 1ML LUER SLIP (SYRINGE) ×2 IMPLANT
TOWEL GREEN STERILE (TOWEL DISPOSABLE) ×2 IMPLANT
TOWEL GREEN STERILE FF (TOWEL DISPOSABLE) ×2 IMPLANT
UNDERPAD 30X36 HEAVY ABSORB (UNDERPADS AND DIAPERS) ×2 IMPLANT

## 2020-03-27 NOTE — Anesthesia Preprocedure Evaluation (Addendum)
Anesthesia Evaluation  Patient identified by MRN, date of birth, ID band Patient awake    Reviewed: Allergy & Precautions, NPO status , Patient's Chart, lab work & pertinent test results  Airway Mallampati: II  TM Distance: >3 FB     Dental  (+) Teeth Intact, Dental Advisory Given,    Pulmonary    breath sounds clear to auscultation       Cardiovascular  Rhythm:Irregular Rate:Normal     Neuro/Psych    GI/Hepatic   Endo/Other    Renal/GU      Musculoskeletal   Abdominal   Peds  Hematology   Anesthesia Other Findings   Reproductive/Obstetrics                             Anesthesia Physical Anesthesia Plan  ASA: III  Anesthesia Plan: General   Post-op Pain Management:    Induction: Intravenous  PONV Risk Score and Plan: Ondansetron  Airway Management Planned: Oral ETT  Additional Equipment:   Intra-op Plan:   Post-operative Plan: Extubation in OR  Informed Consent: I have reviewed the patients History and Physical, chart, labs and discussed the procedure including the risks, benefits and alternatives for the proposed anesthesia with the patient or authorized representative who has indicated his/her understanding and acceptance.     Dental advisory given  Plan Discussed with: CRNA and Anesthesiologist  Anesthesia Plan Comments:         Anesthesia Quick Evaluation

## 2020-03-27 NOTE — Anesthesia Postprocedure Evaluation (Signed)
Anesthesia Post Note  Patient: Charles Mcdonald  Procedure(s) Performed: Exploration bilateral groin wounds with partial closure (Bilateral Groin) SKIN GRAFT SPLIT THICKNESS (Bilateral Groin)     Patient location during evaluation: PACU Anesthesia Type: General Level of consciousness: awake and alert Pain management: pain level controlled Vital Signs Assessment: post-procedure vital signs reviewed and stable Respiratory status: spontaneous breathing, nonlabored ventilation, respiratory function stable and patient connected to nasal cannula oxygen Cardiovascular status: blood pressure returned to baseline and stable Postop Assessment: no apparent nausea or vomiting Anesthetic complications: no   No complications documented.  Last Vitals:  Vitals:   03/27/20 1725  BP: (!) 131/96  Pulse: 100  Resp: 12  Temp: 36.4 C  SpO2: 100%    Last Pain: There were no vitals filed for this visit.               Barnet Glasgow

## 2020-03-27 NOTE — Transfer of Care (Signed)
Immediate Anesthesia Transfer of Care Note  Patient: Charles Mcdonald  Procedure(s) Performed: Exploration bilateral groin wounds with partial closure (Bilateral Groin) SKIN GRAFT SPLIT THICKNESS (Bilateral Groin)  Patient Location: PACU  Anesthesia Type:General  Level of Consciousness: awake, alert  and oriented  Airway & Oxygen Therapy: Patient Spontanous Breathing  Post-op Assessment: Report given to RN and Post -op Vital signs reviewed and stable  Post vital signs: Reviewed and stable  Last Vitals:  Vitals Value Taken Time  BP 131/96 03/27/20 1724  Temp 97.2   Pulse 102 03/27/20 1729  Resp 14 03/27/20 1729  SpO2 92 % 03/27/20 1729  Vitals shown include unvalidated device data.  Glu 76 - PACU RN will give patient juice  Last Pain: There were no vitals filed for this visit.       Complications: No complications documented.

## 2020-03-27 NOTE — Op Note (Signed)
Operative Note   DATE OF OPERATION: 03/27/2020  SURGICAL DEPARTMENT: Plastic Surgery  PREOPERATIVE DIAGNOSES: Bilateral groin, genital and perineal wounds  POSTOPERATIVE DIAGNOSES:  same  PROCEDURE: 1.  Surgical preparation for grafting of bilateral groin, genital and perineal wounds totaling 30 x 27 cm 2.  Split-thickness skin graft to bilateral groin, genital and perineal wounds totaling 30 x 27 cm  SURGEON: Talmadge Coventry, MD  ASSISTANT: Verdie Shire, PA The advanced practice practitioner (APP) assisted throughout the case.  The APP was essential in retraction and counter traction when needed to make the case progress smoothly.  This retraction and assistance made it possible to see the tissue plans for the procedure.  The assistance was needed for blood control, tissue re-approximation and assisted with closure of the incision site.  ANESTHESIA:  General.   COMPLICATIONS: None.   INDICATIONS FOR PROCEDURE:  The patient, Charles Mcdonald is a 75 y.o. male born on 1945-07-31, is here for treatment of wounds from Fournier's gangrene debridement MRN: [id number]  CONSENT:  Informed consent was obtained directly from the patient. Risks, benefits and alternatives were fully discussed. Specific risks including but not limited to bleeding, infection, hematoma, seroma, scarring, pain, contracture, asymmetry, wound healing problems, and need for further surgery were all discussed. The patient did have an ample opportunity to have questions answered to satisfaction.   DESCRIPTION OF PROCEDURE:  The patient was taken to the operating room. SCDs were placed and antibiotics were given.  General anesthesia was administered.  The patient's operative site was prepped and draped in a sterile fashion. A time out was performed and all information was confirmed to be correct.  Started by evaluating the wounds.  There were bilateral groin wounds with healthy granulation tissue.  Both testicles were  totally degloved as was the majority of the shaft of the penis.  The wounds extended back along the groin crease into the perineum.  The total about 30 x 27 cm in size and consisted of pretty much entirely healthy granulation tissue at the base.  I debrided the area with the bevel of the 10 blade to remove all colonized granulation tissue and irrigated copiously with saline irrigation.  Any nonviable tissue was debrided sharply with pickups and scissors.  At this point I felt the wound was ready for skin grafting.  Tumescent solution was infiltrated in both thighs and given time to work.  Split-thickness skin graft was harvested with a dermatome at 14 thousandths of an inch.  It was meshed 1.5-1.  It was secured to the wounds with 3-0 Vicryl sutures.  Xeroform, scrub brush sponges, and 3-0 nylon's were used to fashion a bolster dressing.  The only open area is now posteriorly on the right side and is about 6 cm in largest dimension.  Both eyes were wrapped and the groin was covered with gauze and a mesh underwear.  The patient tolerated the procedure well.  There were no complications. The patient was allowed to wake from anesthesia, extubated and taken to the recovery room in satisfactory condition.

## 2020-03-27 NOTE — Anesthesia Procedure Notes (Signed)
Procedure Name: LMA Insertion Date/Time: 03/27/2020 2:35 PM Performed by: Babs Bertin, CRNA Pre-anesthesia Checklist: Patient identified, Emergency Drugs available, Suction available and Patient being monitored Patient Re-evaluated:Patient Re-evaluated prior to induction Oxygen Delivery Method: Circle System Utilized Preoxygenation: Pre-oxygenation with 100% oxygen Induction Type: IV induction Ventilation: Mask ventilation without difficulty LMA: LMA inserted LMA Size: 4.0 Number of attempts: 1 Airway Equipment and Method: Bite block Placement Confirmation: positive ETCO2 Tube secured with: Tape Dental Injury: Teeth and Oropharynx as per pre-operative assessment

## 2020-03-27 NOTE — Progress Notes (Addendum)
Patient is a 75 year old male status post split-thickness skin graft to bilateral groin wounds, genital and perineal wounds with Dr. Claudia Desanctis today 03/27/2020.  Patient has bolsters in place over groin and perineum.  He also has a wet-to-dry dressing on the posterior aspect of his perineum.  - Recommend optimizing nutritional status via protein shakes.  May benefit from multivitamin. - Recommend leaving bolsters in place for 1 week, plastic surgery team will remove in 1 week to evaluate graft take. - Recommend nursing staff to assist with wet-to-dry dressing changes to posterior perineum - Daily. Cover with 4x4 gauze, ABD and secure with medipore tape.  - Recommend remaining bedbound for 3 days to decrease risk of graft shearing - can begin ambulating/transferring to bedside chair/commode as able on 03/31/2020. - Recommend restarting Lovenox tomorrow morning 03/28/2020, dosing per primary team, 03/28/2020.  Recommend calling with any questions or concerns, we will continue to follow patient.

## 2020-03-27 NOTE — Brief Op Note (Signed)
03/27/2020  5:07 PM  PATIENT:  Charles Mcdonald  75 y.o. male  PRE-OPERATIVE DIAGNOSIS:  fourniers gangrene  POST-OPERATIVE DIAGNOSIS:  fourniers gangrene  PROCEDURE:  Procedure(s): Exploration bilateral groin wounds with partial closure (Bilateral) SKIN GRAFT SPLIT THICKNESS (Bilateral)  SURGEON:  Surgeon(s) and Role:    * Garland Hincapie, Steffanie Dunn, MD - Primary  PHYSICIAN ASSISTANT: Software engineer, PA  ASSISTANTS: none   ANESTHESIA:   general  EBL:  25   BLOOD ADMINISTERED:none  DRAINS: none   LOCAL MEDICATIONS USED:  MARCAINE     SPECIMEN:  No Specimen  DISPOSITION OF SPECIMEN:  N/A  COUNTS:  YES  TOURNIQUET:  * No tourniquets in log *  DICTATION: .Dragon Dictation  PLAN OF CARE: Admit to inpatient   PATIENT DISPOSITION:  PACU - hemodynamically stable.   Delay start of Pharmacological VTE agent (>24hrs) due to surgical blood loss or risk of bleeding: not applicable

## 2020-03-27 NOTE — Interval H&P Note (Signed)
History and Physical Interval Note:  03/27/2020 1:45 PM  Charles Mcdonald  has presented today for surgery, with the diagnosis of fourniers gangrene.  The various methods of treatment have been discussed with the patient and family. After consideration of risks, benefits and other options for treatment, the patient has consented to  Procedure(s) with comments: Exploration bilateral groin wounds with partial closure (Bilateral) - 2 hours total APPLICATION OF PRIMATRIX (Bilateral) SKIN GRAFT SPLIT THICKNESS (Bilateral) as a surgical intervention.  The patient's history has been reviewed, patient examined, no change in status, stable for surgery.  I have reviewed the patient's chart and labs.  Questions were answered to the patient's satisfaction.     Cindra Presume

## 2020-03-28 ENCOUNTER — Encounter (HOSPITAL_COMMUNITY): Payer: Self-pay | Admitting: Plastic Surgery

## 2020-03-28 NOTE — Progress Notes (Signed)
1 Day Post-Op  Subjective: Patient is a 75 year old male status post split-thickness skin graft to bilateral groin wounds, genital and perineal wounds with Dr. Claudia Desanctis on 03/27/2020.  He is postop day 1 today.  He reports that he is feeling good today, has no complaints.  No fevers, chills, nausea, vomiting.  He reports pain is well controlled.  He is very happy this morning  Objective: Vital signs in last 24 hours: Temp:  [97.6 F (36.4 C)] 97.6 F (36.4 C) (01/04 1725) Pulse Rate:  [88-100] 88 (01/04 1755) Resp:  [12-14] 14 (01/04 1755) BP: (124-138)/(87-96) 124/87 (01/04 1755) SpO2:  [100 %] 100 % (01/04 1755)    Intake/Output from previous day: 01/04 0701 - 01/05 0700 In: 1800 [I.V.:1700; IV Piggyback:100] Out: 725 [Urine:700; Blood:25] Intake/Output this shift: No intake/output data recorded.  General appearance: alert, cooperative, no distress and Sitting up, resting in bed Head: Normocephalic, without obvious abnormality, atraumatic Incision/Wound: Dressing in place over groin, did not remove.  No drainage noted.  Lab Results:  CBC Latest Ref Rng & Units 03/25/2020 03/18/2020 03/13/2020  WBC 4.0 - 10.5 K/uL 8.4 8.7 9.5  Hemoglobin 13.0 - 17.0 g/dL 11.2(L) 10.2(L) 10.2(L)  Hematocrit 39.0 - 52.0 % 34.3(L) 32.8(L) 32.7(L)  Platelets 150 - 400 K/uL 364 392 414(H)    BMET Recent Labs    03/26/20 0458  K 3.5   PT/INR No results for input(s): LABPROT, INR in the last 72 hours. ABG No results for input(s): PHART, HCO3 in the last 72 hours.  Invalid input(s): PCO2, PO2  Studies/Results: No results found.  Anti-infectives: Anti-infectives (From admission, onward)   Start     Dose/Rate Route Frequency Ordered Stop   03/28/20 0600  ceFAZolin (ANCEF) IVPB 2g/100 mL premix        2 g 200 mL/hr over 30 Minutes Intravenous On call to O.R. 03/27/20 1305 03/27/20 1510      Assessment/Plan: s/p Procedure(s): Exploration bilateral groin wounds with partial closure SKIN  GRAFT SPLIT THICKNESS  Recommend optimize nutritional status for protein shakes, Glucerna due to patient's diabetes. Recommend leaving bolsters in place for 1 week, we will remove these next week. Recommend nursing staff to assist with wet-to-dry dressing changes to posterior perineum daily.   Recommend remaining bedbound for 3 days to decrease risk of graft shearing, can begin ambulating transferring to bedside chair or commode as able on 03/31/2020, would recommend holding off on any physical therapy until after bolsters are removed and skin grafts are evaluated by our team.  Recommend restarting Lovenox at discretion of primary team, no restrictions from plastic surgery stance.  I did discuss all of these recommendations with the nursing staff today, all of their questions were answered.  I recommend they call us with any questions or concerns.   LOS: 0 days    Charlies Constable, PA-C 03/28/2020

## 2020-03-29 LAB — CBC
HCT: 34 % — ABNORMAL LOW (ref 39.0–52.0)
Hemoglobin: 11.1 g/dL — ABNORMAL LOW (ref 13.0–17.0)
MCH: 30.9 pg (ref 26.0–34.0)
MCHC: 32.6 g/dL (ref 30.0–36.0)
MCV: 94.7 fL (ref 80.0–100.0)
Platelets: 317 10*3/uL (ref 150–400)
RBC: 3.59 MIL/uL — ABNORMAL LOW (ref 4.22–5.81)
RDW: 16.4 % — ABNORMAL HIGH (ref 11.5–15.5)
WBC: 5.6 10*3/uL (ref 4.0–10.5)
nRBC: 0 % (ref 0.0–0.2)

## 2020-03-29 LAB — BASIC METABOLIC PANEL
Anion gap: 9 (ref 5–15)
BUN: 16 mg/dL (ref 8–23)
CO2: 24 mmol/L (ref 22–32)
Calcium: 8.8 mg/dL — ABNORMAL LOW (ref 8.9–10.3)
Chloride: 107 mmol/L (ref 98–111)
Creatinine, Ser: 1.51 mg/dL — ABNORMAL HIGH (ref 0.61–1.24)
GFR, Estimated: 49 mL/min — ABNORMAL LOW (ref >60)
Glucose, Bld: 93 mg/dL (ref 70–99)
Potassium: 3.2 mmol/L — ABNORMAL LOW (ref 3.5–5.1)
Sodium: 140 mmol/L (ref 135–145)

## 2020-03-29 LAB — MAGNESIUM: Magnesium: 1.5 mg/dL — ABNORMAL LOW (ref 1.7–2.4)

## 2020-04-01 LAB — CBC
HCT: 33.3 % — ABNORMAL LOW (ref 39.0–52.0)
Hemoglobin: 11 g/dL — ABNORMAL LOW (ref 13.0–17.0)
MCH: 31.1 pg (ref 26.0–34.0)
MCHC: 33 g/dL (ref 30.0–36.0)
MCV: 94.1 fL (ref 80.0–100.0)
Platelets: 313 10*3/uL (ref 150–400)
RBC: 3.54 MIL/uL — ABNORMAL LOW (ref 4.22–5.81)
RDW: 16 % — ABNORMAL HIGH (ref 11.5–15.5)
WBC: 9.6 10*3/uL (ref 4.0–10.5)
nRBC: 0 % (ref 0.0–0.2)

## 2020-04-01 LAB — BASIC METABOLIC PANEL
Anion gap: 9 (ref 5–15)
BUN: 10 mg/dL (ref 8–23)
CO2: 23 mmol/L (ref 22–32)
Calcium: 8.8 mg/dL — ABNORMAL LOW (ref 8.9–10.3)
Chloride: 107 mmol/L (ref 98–111)
Creatinine, Ser: 1.4 mg/dL — ABNORMAL HIGH (ref 0.61–1.24)
GFR, Estimated: 53 mL/min — ABNORMAL LOW (ref >60)
Glucose, Bld: 89 mg/dL (ref 70–99)
Potassium: 3.2 mmol/L — ABNORMAL LOW (ref 3.5–5.1)
Sodium: 139 mmol/L (ref 135–145)

## 2020-04-01 LAB — MAGNESIUM: Magnesium: 1.7 mg/dL (ref 1.7–2.4)

## 2020-04-02 NOTE — Progress Notes (Signed)
6 Days Post-Op  Subjective: Patient is a 75 year old male status post split-thickness skin graft to bilateral groin wounds, genital and perineal wounds with Dr. Claudia Desanctis on 03/27/2020.  He is 6 days postop.  He reports that he is doing well, he is hopeful everything has healed well after surgery.  He has no complaints today.  He does report that he does not recall if nursing staff has changed his wet-to-dry dressing on his posterior perineum since surgery on 03/27/2020.  He denies any fevers, chills, nausea, vomiting.  He reports that he has been eating a lot of protein, reports he is still waiting on protein shakes.  Objective:    Intake/Output from previous day: No intake/output data recorded. Intake/Output this shift: No intake/output data recorded.  General appearance: alert, cooperative, no distress and restign in bed Head: Normocephalic, without obvious abnormality, atraumatic Resp: unlabored Extremities: extremities normal, atraumatic, no cyanosis or edema Pulses: 2+ and symmetric Incision/Wound: ABD pads in place over groin and scrotal/perineal wounds, no drainage noted.  No foul odors noted.  No peripheral erythema noted.  Bilateral thigh split-thickness skin graft recipient sites with Melgisorb and Ioban dressing in place.  Lab Results:  CBC Latest Ref Rng & Units 04/01/2020 03/29/2020 03/25/2020  WBC 4.0 - 10.5 K/uL 9.6 5.6 8.4  Hemoglobin 13.0 - 17.0 g/dL 11.0(L) 11.1(L) 11.2(L)  Hematocrit 39.0 - 52.0 % 33.3(L) 34.0(L) 34.3(L)  Platelets 150 - 400 K/uL 313 317 364    BMET Recent Labs    04/01/20 0348  NA 139  K 3.2*  CL 107  CO2 23  GLUCOSE 89  BUN 10  CREATININE 1.40*  CALCIUM 8.8*   PT/INR No results for input(s): LABPROT, INR in the last 72 hours. ABG No results for input(s): PHART, HCO3 in the last 72 hours.  Invalid input(s): PCO2, PO2  Studies/Results: No results found.  Anti-infectives: Anti-infectives (From admission, onward)   Start     Dose/Rate Route  Frequency Ordered Stop   03/28/20 0600  ceFAZolin (ANCEF) IVPB 2g/100 mL premix        2 g 200 mL/hr over 30 Minutes Intravenous On call to O.R. 03/27/20 1305 03/27/20 1510      Assessment/Plan: s/p Procedure(s): Exploration bilateral groin wounds with partial closure SKIN GRAFT SPLIT THICKNESS  Patient is doing well today, he has no complaints.  He is hopeful that he will be able to begin moving again soon once we remove the bolster dressings tomorrow.  He has not had any infectious symptoms.  He reports that the wet-to-dry dressing changes of his perineum have not been changed since his surgery, I did discuss this with the nursing staff and provider team today to begin dressing changes daily to this area.  Patient reports he has not started on protein shakes yet, reports that he was notified that they are working on this for him.    LOS: 0 days    Charlies Constable, PA-C 04/02/2020

## 2020-04-03 LAB — POTASSIUM: Potassium: 3.7 mmol/L (ref 3.5–5.1)

## 2020-04-03 NOTE — Progress Notes (Signed)
7 Days Post-Op  Subjective: Patient is a 75 year old male status post split-thickness skin graft to bilateral groin, genital and perineal wounds with Dr. Claudia Desanctis on 03/27/2020.  He reports that he is doing well today.  He has no complaints.  He reports that he is ready to go home.  Dr. Claudia Desanctis and I evaluated the patient today and removed his bolster.  He denies any fevers, chills, nausea, vomiting.  Objective: Vital signs in last 24 hours:      Intake/Output from previous day: No intake/output data recorded. Intake/Output this shift: No intake/output data recorded.  General appearance: alert, cooperative, no distress and Resting in bed. Head: Normocephalic, without obvious abnormality, atraumatic Resp: unlabored Extremities: extremities normal, atraumatic, no cyanosis or edema Incision/Wound: Split-thickness skin graft on groin, genital, perineal wounds adherent.  No necrosis noted.  No skin graft sloughing noted.  No erythema noted.  No cellulitic changes noted.  Bilateral thigh donor sites noted with Melgisorb dressing in place, patient is beginning to epithelialize.  There is no surrounding erythema on either thigh wound.  Lab Results:  CBC Latest Ref Rng & Units 04/01/2020 03/29/2020 03/25/2020  WBC 4.0 - 10.5 K/uL 9.6 5.6 8.4  Hemoglobin 13.0 - 17.0 g/dL 11.0(L) 11.1(L) 11.2(L)  Hematocrit 39.0 - 52.0 % 33.3(L) 34.0(L) 34.3(L)  Platelets 150 - 400 K/uL 313 317 364    BMET Recent Labs    04/01/20 0348 04/03/20 0424  NA 139  --   K 3.2* 3.7  CL 107  --   CO2 23  --   GLUCOSE 89  --   BUN 10  --   CREATININE 1.40*  --   CALCIUM 8.8*  --    PT/INR No results for input(s): LABPROT, INR in the last 72 hours. ABG No results for input(s): PHART, HCO3 in the last 72 hours.  Invalid input(s): PCO2, PO2  Studies/Results: No results found.  Anti-infectives: Anti-infectives (From admission, onward)   Start     Dose/Rate Route Frequency Ordered Stop   03/28/20 0600  ceFAZolin  (ANCEF) IVPB 2g/100 mL premix        2 g 200 mL/hr over 30 Minutes Intravenous On call to O.R. 03/27/20 1305 03/27/20 1510      Assessment/Plan: s/p Procedure(s): Exploration bilateral groin wounds with partial closure SKIN GRAFT SPLIT THICKNESS  Patient is stable for discharge from plastic surgery stance, [facility] for dressing changes at home.  Patient may also benefit from PT/OT at home for recovery after deconditioning due to prolonged hospitalization, defer to primary team.  In regards to wound care to split-thickness skin graft recipient and donor sites: Recommend every other day dressing changes with Xeroform to bilateral groin wounds, scrotal wound and genital split-thickness skin graft recipient sites.  Recommend every other day dressing changes with Xeroform to split-thickness skin graft thigh donor sites.  Will require dressing changes by home health RN three times weekly.  Dr. Claudia Desanctis and I change the patient's dressings today, graft appears adherent and stable.  No sign of infection.  Patient still has perineal wounds posteriorly, should continue with wet-to-dry dressings as recommended per primary team prior to surgical intervention.   Recommend patient work on nutrition at home to help optimize his healing.  May benefit from Glucerna protein shakes and multivitamin.  Recommend following up with plastic surgery team in 2 weeks. DVT prophylaxis per primary team.    LOS: 0 days  Carola Rhine Sophiya Morello, PA-C 04/03/2020

## 2020-04-04 LAB — CBC
HCT: 36.3 % — ABNORMAL LOW (ref 39.0–52.0)
Hemoglobin: 11.6 g/dL — ABNORMAL LOW (ref 13.0–17.0)
MCH: 30.1 pg (ref 26.0–34.0)
MCHC: 32 g/dL (ref 30.0–36.0)
MCV: 94 fL (ref 80.0–100.0)
Platelets: 301 10*3/uL (ref 150–400)
RBC: 3.86 MIL/uL — ABNORMAL LOW (ref 4.22–5.81)
RDW: 15.9 % — ABNORMAL HIGH (ref 11.5–15.5)
WBC: 7.3 10*3/uL (ref 4.0–10.5)
nRBC: 0 % (ref 0.0–0.2)

## 2020-04-04 LAB — URINALYSIS, ROUTINE W REFLEX MICROSCOPIC
Bilirubin Urine: NEGATIVE
Glucose, UA: NEGATIVE mg/dL
Ketones, ur: NEGATIVE mg/dL
Nitrite: NEGATIVE
Protein, ur: >=300 mg/dL — AB
RBC / HPF: >50 RBC/hpf — ABNORMAL HIGH (ref 0–5)
Specific Gravity, Urine: 1.013 (ref 1.005–1.030)
WBC, UA: >50 WBC/hpf — ABNORMAL HIGH (ref 0–5)
pH: 5 (ref 5.0–8.0)

## 2020-04-04 LAB — SARS CORONAVIRUS 2 (TAT 6-24 HRS): SARS Coronavirus 2: POSITIVE — AB

## 2020-04-05 LAB — BLOOD CULTURE ID PANEL (REFLEXED) - BCID2

## 2020-04-06 LAB — CULTURE, BLOOD (ROUTINE X 2): Special Requests: ADEQUATE

## 2020-04-06 LAB — BASIC METABOLIC PANEL
Anion gap: 11 (ref 5–15)
BUN: 12 mg/dL (ref 8–23)
CO2: 23 mmol/L (ref 22–32)
Calcium: 8.5 mg/dL — ABNORMAL LOW (ref 8.9–10.3)
Chloride: 106 mmol/L (ref 98–111)
Creatinine, Ser: 1.57 mg/dL — ABNORMAL HIGH (ref 0.61–1.24)
GFR, Estimated: 47 mL/min — ABNORMAL LOW (ref >60)
Glucose, Bld: 85 mg/dL (ref 70–99)
Potassium: 3.2 mmol/L — ABNORMAL LOW (ref 3.5–5.1)
Sodium: 140 mmol/L (ref 135–145)

## 2020-04-06 LAB — URINE CULTURE: Culture: >=100000 — AB

## 2020-04-07 LAB — BASIC METABOLIC PANEL
Anion gap: 10 (ref 5–15)
BUN: 21 mg/dL (ref 8–23)
CO2: 23 mmol/L (ref 22–32)
Calcium: 8.9 mg/dL (ref 8.9–10.3)
Chloride: 107 mmol/L (ref 98–111)
Creatinine, Ser: 1.61 mg/dL — ABNORMAL HIGH (ref 0.61–1.24)
GFR, Estimated: 45 mL/min — ABNORMAL LOW (ref >60)
Glucose, Bld: 124 mg/dL — ABNORMAL HIGH (ref 70–99)
Potassium: 3.9 mmol/L (ref 3.5–5.1)
Sodium: 140 mmol/L (ref 135–145)

## 2020-04-07 LAB — CBC
HCT: 36.8 % — ABNORMAL LOW (ref 39.0–52.0)
Hemoglobin: 11.6 g/dL — ABNORMAL LOW (ref 13.0–17.0)
MCH: 29.1 pg (ref 26.0–34.0)
MCHC: 31.5 g/dL (ref 30.0–36.0)
MCV: 92.5 fL (ref 80.0–100.0)
Platelets: 319 10*3/uL (ref 150–400)
RBC: 3.98 MIL/uL — ABNORMAL LOW (ref 4.22–5.81)
RDW: 15.7 % — ABNORMAL HIGH (ref 11.5–15.5)
WBC: 3.9 10*3/uL — ABNORMAL LOW (ref 4.0–10.5)
nRBC: 0 % (ref 0.0–0.2)

## 2020-04-08 LAB — CBC
HCT: 33.9 % — ABNORMAL LOW (ref 39.0–52.0)
Hemoglobin: 11 g/dL — ABNORMAL LOW (ref 13.0–17.0)
MCH: 29.8 pg (ref 26.0–34.0)
MCHC: 32.4 g/dL (ref 30.0–36.0)
MCV: 91.9 fL (ref 80.0–100.0)
Platelets: 348 10*3/uL (ref 150–400)
RBC: 3.69 MIL/uL — ABNORMAL LOW (ref 4.22–5.81)
RDW: 15.9 % — ABNORMAL HIGH (ref 11.5–15.5)
WBC: 5.3 10*3/uL (ref 4.0–10.5)
nRBC: 0 % (ref 0.0–0.2)

## 2020-04-09 LAB — VANCOMYCIN, TROUGH: Vancomycin Tr: 17 ug/mL (ref 15–20)

## 2020-04-09 LAB — CULTURE, BLOOD (ROUTINE X 2)
Culture: NO GROWTH
Special Requests: ADEQUATE

## 2020-04-11 ENCOUNTER — Other Ambulatory Visit (HOSPITAL_COMMUNITY): Payer: Medicare Other

## 2020-04-11 LAB — BASIC METABOLIC PANEL
Anion gap: 17 — ABNORMAL HIGH (ref 5–15)
BUN: 26 mg/dL — ABNORMAL HIGH (ref 8–23)
CO2: 19 mmol/L — ABNORMAL LOW (ref 22–32)
Calcium: 9 mg/dL (ref 8.9–10.3)
Chloride: 103 mmol/L (ref 98–111)
Creatinine, Ser: 1.56 mg/dL — ABNORMAL HIGH (ref 0.61–1.24)
GFR, Estimated: 47 mL/min — ABNORMAL LOW (ref >60)
Glucose, Bld: 105 mg/dL — ABNORMAL HIGH (ref 70–99)
Potassium: 3.8 mmol/L (ref 3.5–5.1)
Sodium: 139 mmol/L (ref 135–145)

## 2020-04-11 LAB — BLOOD GAS, ARTERIAL
Acid-base deficit: 6.4 mmol/L — ABNORMAL HIGH (ref 0.0–2.0)
Acid-base deficit: 3.1 mmol/L — ABNORMAL HIGH (ref 0.0–2.0)
Bicarbonate: 18.2 mmol/L — ABNORMAL LOW (ref 20.0–28.0)
Bicarbonate: 21.5 mmol/L (ref 20.0–28.0)
FIO2: 100
FIO2: 50
O2 Saturation: 85.1 %
O2 Saturation: 90.5 %
Patient temperature: 37
Patient temperature: 37
pCO2 arterial: 34 mmHg (ref 32.0–48.0)
pCO2 arterial: 39.3 mmHg (ref 32.0–48.0)
pH, Arterial: 7.349 — ABNORMAL LOW (ref 7.350–7.450)
pH, Arterial: 7.357 (ref 7.350–7.450)
pO2, Arterial: 57.4 mmHg — ABNORMAL LOW (ref 83.0–108.0)
pO2, Arterial: 66.2 mmHg — ABNORMAL LOW (ref 83.0–108.0)

## 2020-04-11 LAB — CBC
HCT: 42.6 % (ref 39.0–52.0)
Hemoglobin: 13.7 g/dL (ref 13.0–17.0)
MCH: 29.6 pg (ref 26.0–34.0)
MCHC: 32.2 g/dL (ref 30.0–36.0)
MCV: 92 fL (ref 80.0–100.0)
Platelets: 367 10*3/uL (ref 150–400)
RBC: 4.63 MIL/uL (ref 4.22–5.81)
RDW: 16.9 % — ABNORMAL HIGH (ref 11.5–15.5)
WBC: 13.2 10*3/uL — ABNORMAL HIGH (ref 4.0–10.5)
nRBC: 0.2 % (ref 0.0–0.2)

## 2020-04-11 LAB — TROPONIN I (HIGH SENSITIVITY)
Troponin I (High Sensitivity): 514 ng/L (ref <18)
Troponin I (High Sensitivity): 1366 ng/L (ref <18)
Troponin I (High Sensitivity): 1529 ng/L (ref <18)

## 2020-04-11 LAB — D-DIMER, QUANTITATIVE: D-Dimer, Quant: 7.39 ug/mL-FEU — ABNORMAL HIGH (ref 0.00–0.50)

## 2020-04-11 LAB — BRAIN NATRIURETIC PEPTIDE: B Natriuretic Peptide: 3412.9 pg/mL — ABNORMAL HIGH (ref 0.0–100.0)

## 2020-04-11 LAB — MAGNESIUM: Magnesium: 1.8 mg/dL (ref 1.7–2.4)

## 2020-04-11 NOTE — Consult Note (Signed)
Referring Physician: S. Owens Shark, MD  Charles Mcdonald is an 75 y.o. male.                       Chief Complaint: Chronic systolic left heart failure/S/P acute on chronic respiratory failure  HPI: 75 years old black male with PMH of hypertension, hyperlipidemia, CKD 2, CAD with stent placement, atrial fibrillation, ischemic and dilated cardiomyopathy, s/p ICD placement, type 2 DM, Fournier gangrene with penile, scrotal and pelvic area debridement and skin graft in 01/2020 had acute respiratory distress earlier today. He was intubated and sedated. He also had recent COVID infection. EKG-Sinus tachycardia with IVCD and possible inferior and lateral wall MI CXR: bilateral dense infiltrates or edema  Past medical history: As per HPI.  Past Surgical History:  Procedure Laterality Date  . SKIN SPLIT GRAFT Bilateral 03/27/2020   Procedure: SKIN GRAFT SPLIT THICKNESS;  Surgeon: Cindra Presume, MD;  Location: [facility];  Service: Plastics;  Laterality: Bilateral;  . WOUND EXPLORATION Bilateral 03/27/2020   Procedure: Exploration bilateral groin wounds with partial closure;  Surgeon: Cindra Presume, MD;  Location: [facility];  Service: Plastics;  Laterality: Bilateral;    History reviewed. No pertinent family history. Social History:  has no history on file for tobacco use, alcohol use, and drug use.  Allergies: Not on File  No medications prior to admission.  See list on chart to include atorvastatin, Lovenox, Lantus, Midodrine and protonix. Now on Propofol drip.  Results for orders placed or performed during the hospital encounter of 02/29/20 (from the past 48 hour(s))  Blood gas, arterial     Status: Abnormal   Collection Time: 04/11/20  9:43 AM  Result Value Ref Range   FIO2 100.00    pH, Arterial 7.349 (L) 7.350 - 7.450   pCO2 arterial 34.0 32.0 - 48.0 mmHg   pO2, Arterial 57.4 (L) 83.0 - 108.0 mmHg   Bicarbonate 18.2 (L) 20.0 - 28.0 mmol/L   Acid-base deficit 6.4 (H) 0.0 - 2.0 mmol/L   O2  Saturation 85.1 %   Patient temperature 37.0    Collection site RIGHT RADIAL    Drawn by Delane Ginger PAYNE    Sample type ARTERIAL DRAW    Allens test (pass/fail) PASS PASS    Comment: Performed at [facility], [place] [address] [place], [place] [address]  Blood gas, arterial     Status: Abnormal   Collection Time: 04/11/20  2:05 PM  Result Value Ref Range   FIO2 50.00    pH, Arterial 7.357 7.350 - 7.450   pCO2 arterial 39.3 32.0 - 48.0 mmHg   pO2, Arterial 66.2 (L) 83.0 - 108.0 mmHg   Bicarbonate 21.5 20.0 - 28.0 mmol/L   Acid-base deficit 3.1 (H) 0.0 - 2.0 mmol/L   O2 Saturation 90.5 %   Patient temperature 37.0    Collection site RIGHT RADIAL    Drawn by COLLECTED BY RT     Comment:  M. MANUEL   Sample type ARTERIAL DRAW    Allens test (pass/fail) PASS PASS    Comment: Performed at [facility], [place] [address] [place], [place] [id number]  Basic metabolic panel     Status: Abnormal   Collection Time: 04/11/20  2:26 PM  Result Value Ref Range   Sodium 139 135 - 145 mmol/L   Potassium 3.8 3.5 - 5.1 mmol/L   Chloride 103 98 - 111 mmol/L   CO2 19 (L) 22 - 32  mmol/L   Glucose, Bld 105 (H) 70 - 99 mg/dL    Comment: Glucose reference range applies only to samples taken after fasting for at least 8 hours.   BUN 26 (H) 8 - 23 mg/dL   Creatinine, Ser 1.56 (H) 0.61 - 1.24 mg/dL   Calcium 9.0 8.9 - 10.3 mg/dL   GFR, Estimated 47 (L) >60 mL/min    Comment: (NOTE) Calculated using the CKD-EPI Creatinine Equation (2021)    Anion gap 17 (H) 5 - 15    Comment: Performed at [facility] [address] [place], [place] [address]  Magnesium     Status: None   Collection Time: 04/11/20  2:26 PM  Result Value Ref Range   Magnesium 1.8 1.7 - 2.4 mg/dL    Comment: Performed at [facility], [place] [address], [place], [place] [address]  Troponin I (High Sensitivity)     Status: Abnormal   Collection Time: 04/11/20  2:26 PM  Result Value Ref Range   Troponin I (High  Sensitivity) 514 (HH) <18 ng/L    Comment: CRITICAL RESULT CALLED TO, READ BACK BY AND VERIFIED WITH: S.CAMPBELL RN 1723 04/11/20 MCCORMICK K (NOTE) Elevated high sensitivity troponin I (hsTnI) values and significant  changes across serial measurements may suggest ACS but many other  chronic and acute conditions are known to elevate hsTnI results.  Refer to the Links section for chest pain algorithms and additional  guidance. Performed at [facility], [place] [address] [place], [place] [address]   CBC     Status: Abnormal   Collection Time: 04/11/20  4:14 PM  Result Value Ref Range   WBC 13.2 (H) 4.0 - 10.5 K/uL   RBC 4.63 4.22 - 5.81 MIL/uL   Hemoglobin 13.7 13.0 - 17.0 g/dL   HCT 42.6 39.0 - 52.0 %   MCV 92.0 80.0 - 100.0 fL   MCH 29.6 26.0 - 34.0 pg   MCHC 32.2 30.0 - 36.0 g/dL   RDW 16.9 (H) 11.5 - 15.5 %   Platelets 367 150 - 400 K/uL   nRBC 0.2 0.0 - 0.2 %    Comment: Performed at [facility] [address], [place], [place] [address]  D-dimer, quantitative (not at [facility])     Status: Abnormal   Collection Time: 04/11/20  7:03 PM  Result Value Ref Range   D-Dimer, Quant 7.39 (H) 0.00 - 0.50 ug/mL-FEU    Comment: (NOTE) At the manufacturer cut-off value of 0.5 g/mL FEU, this assay has a negative predictive value of 95-100%.This assay is intended for use in conjunction with a clinical pretest probability (PTP) assessment model to exclude pulmonary embolism (PE) and deep venous thrombosis (DVT) in outpatients suspected of PE or DVT. Results should be correlated with clinical presentation. Performed at [facility], [place] [address], [place], [place] [address]   Brain natriuretic peptide     Status: Abnormal   Collection Time: 04/11/20  7:03 PM  Result Value Ref Range   B Natriuretic Peptide 3,412.9 (H) 0.0 - 100.0 pg/mL    Comment: Performed at [facility] [name] [place], [place] [address]  Troponin I (High Sensitivity)     Status:  Abnormal   Collection Time: 04/11/20  7:03 PM  Result Value Ref Range   Troponin I (High Sensitivity) 1,366 (HH) <18 ng/L    Comment: CRITICAL VALUE NOTED.  VALUE IS CONSISTENT WITH PREVIOUSLY REPORTED AND CALLED VALUE. (NOTE)  Elevated high sensitivity troponin I (hsTnI) values and significant  changes across serial measurements may suggest ACS but many other  chronic and acute conditions are known to elevate hsTnI results.  Refer to the Links section for chest pain algorithms and additional  guidance. Performed at [facility], [place] [address], [place], [place] [address]    DG Chest Port 1 View  Result Date: 04/11/2020 CLINICAL DATA:  Respiratory failure. EXAM: PORTABLE CHEST 1 VIEW COMPARISON:  No prior. FINDINGS: Endotracheal tube noted with tip 3 cm above the carina. Pneumomediastinum cannot be excluded. Tiny right apical pneumothorax cannot be excluded. Cardiac pacer with lead tip over the right atrium right ventricle. Cardiomegaly. Tortuous thoracic aorta. Diffuse bilateral dense pulmonary infiltrates/edema. Small left pleural effusion cannot be excluded. Biapical pleural thickening noted most consistent with scarring. Degenerative changes thoracic spine. IMPRESSION: 1. Endotracheal tube noted with tip 3 cm above the carina. 2. Pneumomediastinum cannot be excluded. Tiny right apical pneumothorax cannot be excluded. Cardiac pacer with lead tips over the right atrium and right ventricle. 3. Diffuse bilateral dense pulmonary infiltrates/edema. 4. Cardiac pacer with lead tips in the right atrium right ventricle. Cardiomegaly. Critical Value/emergent results were called by telephone at the time of interpretation on 04/11/2020 at 10:36 am to provider [facility] , who verbally acknowledged these results. Electronically Signed   By: Marcello Moores  Register   On: 04/11/2020 10:38   DG Abd Portable 1V  Result Date: 04/11/2020 CLINICAL DATA:  NG tube placement EXAM: PORTABLE ABDOMEN - 1 VIEW  COMPARISON:  February 29, 2020 FINDINGS: The enteric tube projects over the gastric body. There is a gastrostomy tube projecting over the stomach. The cecum appears to be distended. There are hazy airspace opacities in the lung bases bilaterally. IMPRESSION: 1. Enteric tube projects over the gastric body. Electronically Signed   By: Constance Holster M.D.   On: 04/11/2020 22:13    Review Of Systems As per HPI.   Blood pressure 124/87, pulse 98, temperature 97.6 F (36.4 C), resp. rate 19, SpO2 100 %. There is no height or weight on file to calculate BMI. General appearance: sedated and intubated. Head: Normocephalic, atraumatic. Eyes: Brown eyes, pink conjunctiva, corneas clear.  Neck: No adenopathy, no carotid bruit, no JVD, supple, symmetrical. Resp: Rhonchi to auscultation bilaterally. Cardio: Regular rate and rhythm, S1, S2 normal, II/VI systolic murmur, no click, rub or gallop GI: Soft, non-tender; bowel sounds normal; no organomegaly. Extremities: No edema, cyanosis or clubbing. Skin: Warm and dry.  Neurologic: Alert and oriented X 3, normal strength. Normal coordination and gait.  Assessment/Plan Acute on chronic respiratory failure with hypoxia Acute inferior and lateral wall MI COVID-19 pneumonia Acute on chronic systolic left heart failure Type 2 DM S/P Fournier gangrene with surgery CAD S/P coronary stent S/P ICD CKD, 2   Continue supportive care with atorvastatin, metoprolol and Lovenox. He is high risk for cardiac interventions.  Time spent: Review of old records, Lab, x-rays, EKG, other cardiac tests, examination, discussion with patient/Nurse/Physician over 70 minutes.  Birdie Riddle, MD  04/11/2020, 11:38 PM

## 2020-04-11 NOTE — Consult Note (Incomplete)
Referring Physician: S. Owens Shark, MD  Charles Mcdonald is an 75 y.o. male.                       Chief Complaint: Chronic systolic left heart failure/S/P acute on chronic respiratory failure  HPI: 75 years old black male with PMH of hypertension, hyperlipidemia, CKD 2, CAD with stent placement, atrial fibrillation, ischemic and dilated cardiomyopathy, s/p ICD placement, type 2 DM, Fournier gangrene with penile, scrotal and pelvic area debridement and skin graft in 01/2020 had acute respiratory distress earlier today. He was intubated and sedated. He also had recent COVID infection. EKG-Sinus tachycardia with IVCD and possible inferior and lateral wall MI CXR: bilateral dense infiltrates or edema  Past medical history: As per HPI.  Past Surgical History:  Procedure Laterality Date  . SKIN SPLIT GRAFT Bilateral 03/27/2020   Procedure: SKIN GRAFT SPLIT THICKNESS;  Surgeon: Cindra Presume, MD;  Location: [place];  Service: Plastics;  Laterality: Bilateral;  . WOUND EXPLORATION Bilateral 03/27/2020   Procedure: Exploration bilateral groin wounds with partial closure;  Surgeon: Cindra Presume, MD;  Location: [place];  Service: Plastics;  Laterality: Bilateral;    History reviewed. No pertinent family history. Social History:  has no history on file for tobacco use, alcohol use, and drug use.  Allergies: Not on File  No medications prior to admission.  See list on chart to include atorvastatin, Lovenox, Lantus, Midodrine and protonix. Now on Propofol drip.  Results for orders placed or performed during the hospital encounter of 02/29/20 (from the past 48 hour(s))  Blood gas, arterial     Status: Abnormal   Collection Time: 04/11/20  9:43 AM  Result Value Ref Range   FIO2 100.00    pH, Arterial 7.349 (L) 7.350 - 7.450   pCO2 arterial 34.0 32.0 - 48.0 mmHg   pO2, Arterial 57.4 (L) 83.0 - 108.0 mmHg   Bicarbonate 18.2 (L) 20.0 - 28.0 mmol/L   Acid-base deficit 6.4 (H) 0.0 - 2.0 mmol/L   O2  Saturation 85.1 %   Patient temperature 37.0    Collection site RIGHT RADIAL    Drawn by Delane Ginger PAYNE    Sample type ARTERIAL DRAW    Allens test (pass/fail) PASS PASS    Comment: Performed at [facility], [place] [address] [place], [place] [address]  Blood gas, arterial     Status: Abnormal   Collection Time: 04/11/20  2:05 PM  Result Value Ref Range   FIO2 50.00    pH, Arterial 7.357 7.350 - 7.450   pCO2 arterial 39.3 32.0 - 48.0 mmHg   pO2, Arterial 66.2 (L) 83.0 - 108.0 mmHg   Bicarbonate 21.5 20.0 - 28.0 mmol/L   Acid-base deficit 3.1 (H) 0.0 - 2.0 mmol/L   O2 Saturation 90.5 %   Patient temperature 37.0    Collection site RIGHT RADIAL    Drawn by COLLECTED BY RT     Comment:  M. MANUEL   Sample type ARTERIAL DRAW    Allens test (pass/fail) PASS PASS    Comment: Performed at [facility], [place] [address] [place], [place] [id number]  Basic metabolic panel     Status: Abnormal   Collection Time: 04/11/20  2:26 PM  Result Value Ref Range   Sodium 139 135 - 145 mmol/L   Potassium 3.8 3.5 - 5.1 mmol/L   Chloride 103 98 - 111 mmol/L   CO2 19 (L) 22 - 32  mmol/L   Glucose, Bld 105 (H) 70 - 99 mg/dL    Comment: Glucose reference range applies only to samples taken after fasting for at least 8 hours.   BUN 26 (H) 8 - 23 mg/dL   Creatinine, Ser 1.56 (H) 0.61 - 1.24 mg/dL   Calcium 9.0 8.9 - 10.3 mg/dL   GFR, Estimated 47 (L) >60 mL/min    Comment: (NOTE) Calculated using the CKD-EPI Creatinine Equation (2021)    Anion gap 17 (H) 5 - 15    Comment: Performed at [facility] [address] [place], [place] [address]  Magnesium     Status: None   Collection Time: 04/11/20  2:26 PM  Result Value Ref Range   Magnesium 1.8 1.7 - 2.4 mg/dL    Comment: Performed at [facility], [place] [address], [place], [place] [address]  Troponin I (High Sensitivity)     Status: Abnormal   Collection Time: 04/11/20  2:26 PM  Result Value Ref Range   Troponin I (High  Sensitivity) 514 (HH) <18 ng/L    Comment: CRITICAL RESULT CALLED TO, READ BACK BY AND VERIFIED WITH: S.CAMPBELL RN 1723 04/11/20 MCCORMICK K (NOTE) Elevated high sensitivity troponin I (hsTnI) values and significant  changes across serial measurements may suggest ACS but many other  chronic and acute conditions are known to elevate hsTnI results.  Refer to the Links section for chest pain algorithms and additional  guidance. Performed at [facility], [place] [address] [place], [place] [address]   CBC     Status: Abnormal   Collection Time: 04/11/20  4:14 PM  Result Value Ref Range   WBC 13.2 (H) 4.0 - 10.5 K/uL   RBC 4.63 4.22 - 5.81 MIL/uL   Hemoglobin 13.7 13.0 - 17.0 g/dL   HCT 42.6 39.0 - 52.0 %   MCV 92.0 80.0 - 100.0 fL   MCH 29.6 26.0 - 34.0 pg   MCHC 32.2 30.0 - 36.0 g/dL   RDW 16.9 (H) 11.5 - 15.5 %   Platelets 367 150 - 400 K/uL   nRBC 0.2 0.0 - 0.2 %    Comment: Performed at [facility] [address] [place], [place] [address]  D-dimer, quantitative (not at [facility])     Status: Abnormal   Collection Time: 04/11/20  7:03 PM  Result Value Ref Range   D-Dimer, Quant 7.39 (H) 0.00 - 0.50 ug/mL-FEU    Comment: (NOTE) At the manufacturer cut-off value of 0.5 g/mL FEU, this assay has a negative predictive value of 95-100%.This assay is intended for use in conjunction with a clinical pretest probability (PTP) assessment model to exclude pulmonary embolism (PE) and deep venous thrombosis (DVT) in outpatients suspected of PE or DVT. Results should be correlated with clinical presentation. Performed at [facility], [place] [address], [place], [place] [address]   Brain natriuretic peptide     Status: Abnormal   Collection Time: 04/11/20  7:03 PM  Result Value Ref Range   B Natriuretic Peptide 3,412.9 (H) 0.0 - 100.0 pg/mL    Comment: Performed at [facility] [address] [place],  [address]  Troponin I (High Sensitivity)     Status:  Abnormal   Collection Time: 04/11/20  7:03 PM  Result Value Ref Range   Troponin I (High Sensitivity) 1,366 (HH) <18 ng/L    Comment: CRITICAL VALUE NOTED.  VALUE IS CONSISTENT WITH PREVIOUSLY REPORTED AND CALLED VALUE. (NOTE)  Elevated high sensitivity troponin I (hsTnI) values and significant  changes across serial measurements may suggest ACS but many other  chronic and acute conditions are known to elevate hsTnI results.  Refer to the Links section for chest pain algorithms and additional  guidance. Performed at [facility], [place] [address], [place], [place] [address]    DG Chest Port 1 View  Result Date: 04/11/2020 CLINICAL DATA:  Respiratory failure. EXAM: PORTABLE CHEST 1 VIEW COMPARISON:  No prior. FINDINGS: Endotracheal tube noted with tip 3 cm above the carina. Pneumomediastinum cannot be excluded. Tiny right apical pneumothorax cannot be excluded. Cardiac pacer with lead tip over the right atrium right ventricle. Cardiomegaly. Tortuous thoracic aorta. Diffuse bilateral dense pulmonary infiltrates/edema. Small left pleural effusion cannot be excluded. Biapical pleural thickening noted most consistent with scarring. Degenerative changes thoracic spine. IMPRESSION: 1. Endotracheal tube noted with tip 3 cm above the carina. 2. Pneumomediastinum cannot be excluded. Tiny right apical pneumothorax cannot be excluded. Cardiac pacer with lead tips over the right atrium and right ventricle. 3. Diffuse bilateral dense pulmonary infiltrates/edema. 4. Cardiac pacer with lead tips in the right atrium right ventricle. Cardiomegaly. Critical Value/emergent results were called by telephone at the time of interpretation on 04/11/2020 at 10:36 am to provider [facility] , who verbally acknowledged these results. Electronically Signed   By: Marcello Moores  Register   On: 04/11/2020 10:38   DG Abd Portable 1V  Result Date: 04/11/2020 CLINICAL DATA:  NG tube placement EXAM: PORTABLE ABDOMEN - 1 VIEW  COMPARISON:  February 29, 2020 FINDINGS: The enteric tube projects over the gastric body. There is a gastrostomy tube projecting over the stomach. The cecum appears to be distended. There are hazy airspace opacities in the lung bases bilaterally. IMPRESSION: 1. Enteric tube projects over the gastric body. Electronically Signed   By: Constance Holster M.D.   On: 04/11/2020 22:13    Review Of Systems As per HPI.   Blood pressure 124/87, pulse 98, temperature 97.6 F (36.4 C), resp. rate 19, SpO2 100 %. There is no height or weight on file to calculate BMI. General appearance: sedated and intubated. Head: Normocephalic, atraumatic. Eyes: Brown eyes, pink conjunctiva, corneas clear.  Neck: No adenopathy, no carotid bruit, no JVD, supple, symmetrical. Resp: Rhonchi to auscultation bilaterally. Cardio: Regular rate and rhythm, S1, S2 normal, II/VI systolic murmur, no click, rub or gallop GI: Soft, non-tender; bowel sounds normal; no organomegaly. Extremities: No edema, cyanosis or clubbing. Skin: Warm and dry.  Neurologic: Alert and oriented X 3, normal strength. Normal coordination and gait.  Assessment/Plan Acute on chronic respiratory failure with hypoxia Acute inferior and lateral wall MI COVID-19 pneumonia Acute on chronic systolic left heart failure Type 2 DM S/P Fournier gangrene with surgery CAD S/P coronary stent S/P ICD CKD, 2   Continue supportive care.  Time spent: Review of old records, Lab, x-rays, EKG, other cardiac tests, examination, discussion with patient over 70 minutes.  Birdie Riddle, MD  04/11/2020, 11:38 PM

## 2020-04-12 ENCOUNTER — Other Ambulatory Visit (HOSPITAL_COMMUNITY): Payer: Medicare Other

## 2020-04-12 DIAGNOSIS — I5022 Chronic systolic (congestive) heart failure: Secondary | ICD-10-CM | POA: Diagnosis not present

## 2020-04-12 DIAGNOSIS — J9621 Acute and chronic respiratory failure with hypoxia: Secondary | ICD-10-CM | POA: Diagnosis not present

## 2020-04-12 DIAGNOSIS — N1832 Chronic kidney disease, stage 3b: Secondary | ICD-10-CM | POA: Diagnosis not present

## 2020-04-12 DIAGNOSIS — I482 Chronic atrial fibrillation, unspecified: Secondary | ICD-10-CM

## 2020-04-12 LAB — CBC
HCT: 37 % — ABNORMAL LOW (ref 39.0–52.0)
Hemoglobin: 12.4 g/dL — ABNORMAL LOW (ref 13.0–17.0)
MCH: 30.5 pg (ref 26.0–34.0)
MCHC: 33.5 g/dL (ref 30.0–36.0)
MCV: 90.9 fL (ref 80.0–100.0)
Platelets: 304 10*3/uL (ref 150–400)
RBC: 4.07 MIL/uL — ABNORMAL LOW (ref 4.22–5.81)
RDW: 16.9 % — ABNORMAL HIGH (ref 11.5–15.5)
WBC: 4.5 10*3/uL (ref 4.0–10.5)
nRBC: 0 % (ref 0.0–0.2)

## 2020-04-12 LAB — BASIC METABOLIC PANEL
Anion gap: 16 — ABNORMAL HIGH (ref 5–15)
BUN: 23 mg/dL (ref 8–23)
CO2: 21 mmol/L — ABNORMAL LOW (ref 22–32)
Calcium: 8.5 mg/dL — ABNORMAL LOW (ref 8.9–10.3)
Chloride: 106 mmol/L (ref 98–111)
Creatinine, Ser: 1.48 mg/dL — ABNORMAL HIGH (ref 0.61–1.24)
GFR, Estimated: 50 mL/min — ABNORMAL LOW (ref >60)
Glucose, Bld: 85 mg/dL (ref 70–99)
Potassium: 4.1 mmol/L (ref 3.5–5.1)
Sodium: 143 mmol/L (ref 135–145)

## 2020-04-12 MED ORDER — IOHEXOL 350 MG/ML SOLN
100.0000 mL | Freq: Once | INTRAVENOUS | Status: AC | PRN
  Administered 2020-04-12: 54 mL via INTRAVENOUS

## 2020-04-12 NOTE — Consult Note (Signed)
Infectious Disease Consultation   Charles Mcdonald  [id number]  DOB: [birth year]  DOA: 02/29/2020  Requesting physician: Dr. Owens Shark  Reason for consultation: Antibiotic recommendations   History of Present Illness: Charles Mcdonald is an 75 y.o. male with medical history significant for systolic congestive heart failure with EF 20-25%, hypertension, hyperlipidemia, coronary disease with stent placement, atrial fibrillation/flutter, ischemic dilated cardiomyopathy, ventricular tachycardia status post ICD placement, CKD stage III, diabetes mellitus type 2, gout who was admitted to the acute facility on 02/04/2020 with complaints of penile bleeding, testicular pain and syncope.  Patient apparently had an I&D of a penile abscess done on 01/29/2020.  Patient was found to be confused, hypotensive, abdominal pain.  On arrival to the ED patient was noted to be in atrial fibrillation and required Levophed for hypotension.  He had leukocytosis with elevated lactic acid.  He was treated with empiric antibiotic IV vancomycin, Zosyn, clindamycin.  He was given amiodarone for his atrial fibrillation.  He also had DKA and was given insulin infusion.  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  Surgery was consulted he was taken to the OR for surgical debridement.  He was found to have necrosis of the entire scrotal wall and skin of penile shaft.  There was also necrotic tissue in the prepubic fat pad requiring general surgery consult.  He was found to have necrotizing soft tissue infection extending from the genitals all the way up to his pubic area.  On 02/10/2020 he had another debridement.  On 02/13/2020 he had worsening abdominal distention and KUB showed ileus.  He was started on a bowel regimen with some improvement.  Unfortunately he had new wounds surrounding the surgical site.  He had to be taken to the OR again on 02/19/2020 for further debridement and ostomy on  02/20/2020.  He was discharged and [facility].  He received treatment with antibiotics. Plastic surgery was consulted and the patient underwent exploration of bilateral groin wounds with partial closure, skin graft split thickness on 03/27/2020.  Patient was doing reasonably okay.  However, on 04/04/2020 he tested positive for COVID.  He had worsening respiratory failure.  His troponin was also found to be elevated.  He had to be intubated.  He is currently intubated and sedated.  Chest x-ray showing findings concerning for pneumonia.    Review of Systems:  Unable to obtain review of systems at this time   Past Medical History: Multiple debridement surgeries for necrotizing fasciitis/Fournier's gangrene, diverting loop colostomy, congestive heart failure, cardiomyopathy, coronary artery disease  Past Surgical History: Past Surgical History:  Procedure Laterality Date  . SKIN SPLIT GRAFT Bilateral 03/27/2020   Procedure: SKIN GRAFT SPLIT THICKNESS;  Surgeon: Cindra Presume, MD;  Location: [facility];  Service: Plastics;  Laterality: Bilateral;  . WOUND EXPLORATION Bilateral 03/27/2020   Procedure: Exploration bilateral groin wounds with partial closure;  Surgeon: Cindra Presume, MD;  Location: [place];  Service: Plastics;  Laterality: Bilateral;     Allergies: No known drug allergies  Social History: He apparently was a former smoker until 2002, no mention of alcohol or illicit drug abuse  Family History: Unable to obtain at this time.  Physical Exam: Vitals: Temperature 98.6, pulse 92, respiratory rate 20, blood pressure 134/94  Constitutional: Ill-appearing male, orally intubated, sedated Head: Atraumatic, normocephalic Eyes: PERLA  ENMT: external ears and nose appear normal, orally intubated  Neck: No masses CVS: S1-S2, murmur Respiratory: Coarse breath sounds, rhonchi Abdomen: Soft, positive bowel sounds Musculoskeletal: No edema Neuro: Patient  intubated, sedated, unable to do a neuro exam at this time.   Psych: Unable to assess at this time Skin: no rashes   Data reviewed:  I have personally reviewed following labs and imaging studies Labs:  CBC: Recent Labs  Lab 04/07/20 0429 04/08/20 0442 04/11/20 1614 04/12/20 0630  WBC 3.9* 5.3 13.2* 4.5  HGB 11.6* 11.0* 13.7 12.4*  HCT 36.8* 33.9* 42.6 37.0*  MCV 92.5 91.9 92.0 90.9  PLT 319 348 367 [id number]    Basic Metabolic Panel: Recent Labs  Lab 04/06/20 0830 04/07/20 0429 04/11/20 1426 04/12/20 0630  NA 140 140 139 143  K 3.2* 3.9 3.8 4.1  CL 106 107 103 106  CO2 23 23 19* 21*  GLUCOSE 85 124* 105* 85  BUN 12 21 26* 23  CREATININE 1.57* 1.61* 1.56* 1.48*  CALCIUM 8.5* 8.9 9.0 8.5*  MG  --   --  1.8  --    GFR CrCl cannot be calculated (Unknown ideal weight.). Liver Function Tests: No results for input(s): AST, ALT, ALKPHOS, BILITOT, PROT, ALBUMIN in the last 168 hours. No results for input(s): LIPASE, AMYLASE in the last 168 hours. No results for input(s): AMMONIA in the last 168 hours. Coagulation profile No results for input(s): INR, PROTIME in the last 168 hours.  Cardiac Enzymes: No results for input(s): CKTOTAL, CKMB, CKMBINDEX, TROPONINI in the last 168 hours. BNP: Invalid input(s): POCBNP CBG: No results for input(s): GLUCAP in the last 168 hours. D-Dimer Recent Labs    04/11/20 1903  DDIMER 7.39*   Hgb A1c No results for input(s): HGBA1C in the last 72 hours. Lipid Profile No results for input(s): CHOL, HDL, LDLCALC, TRIG, CHOLHDL, LDLDIRECT in the last 72 hours. Thyroid function studies No results for input(s): TSH, T4TOTAL, T3FREE, THYROIDAB in the last 72 hours.  Invalid input(s): FREET3 Anemia work up No results for input(s): VITAMINB12, FOLATE, FERRITIN, TIBC, IRON, RETICCTPCT in the last 72 hours. Urinalysis    Component Value Date/Time   COLORURINE YELLOW 04/04/2020 1827   APPEARANCEUR CLOUDY (A) 04/04/2020 1827   LABSPEC 1.013  04/04/2020 1827   PHURINE 5.0 04/04/2020 1827   GLUCOSEU NEGATIVE 04/04/2020 1827   HGBUR MODERATE (A) 04/04/2020 1827   BILIRUBINUR NEGATIVE 04/04/2020 1827   KETONESUR NEGATIVE 04/04/2020 1827   PROTEINUR >=300 (A) 04/04/2020 1827   NITRITE NEGATIVE 04/04/2020 1827   LEUKOCYTESUR LARGE (A) 04/04/2020 1827     Sepsis Labs Invalid input(s): PROCALCITONIN,  WBC,  LACTICIDVEN Microbiology Recent Results (from the past 240 hour(s))  SARS CORONAVIRUS 2 (TAT 6-24 HRS) Nasopharyngeal Nasopharyngeal Swab     Status: Abnormal   Collection Time: 04/04/20  6:27 PM   Specimen: Nasopharyngeal Swab  Result Value Ref Range Status   SARS Coronavirus 2 POSITIVE (A) NEGATIVE Final    Comment: (NOTE) SARS-CoV-2 target nucleic acids are DETECTED.  The SARS-CoV-2 RNA is generally detectable in upper and lower respiratory specimens during the acute phase of infection. Positive results are indicative of the presence of SARS-CoV-2 RNA. Clinical correlation with patient history and other diagnostic information is  necessary to determine patient infection status. Positive results do not rule out bacterial infection or co-infection with other viruses.  The expected result is Negative.  Fact Sheet for Patients: SugarRoll.be  Fact Sheet for Healthcare Providers: https://www.woods-mathews.com/  This test is not yet approved or cleared by the [place]  FDA and  has been authorized for detection and/or diagnosis of SARS-CoV-2 by FDA under an Emergency Use Authorization (EUA). This EUA will remain  in effect (meaning this test can be used) for the duration of the COVID-19 declaration under Section 564(b)(1) of the Act, 21 U. S.C. section 360bbb-3(b)(1), unless the authorization is terminated or revoked sooner.   Performed at [facility], [place] [address], [place], [place] [address]   Culture, blood (Routine X 2) w Reflex to ID Panel     Status:  Abnormal   Collection Time: 04/04/20  6:50 PM   Specimen: BLOOD  Result Value Ref Range Status   Specimen Description BLOOD RIGHT ANTECUBITAL  Final   Special Requests   Final    BOTTLES DRAWN AEROBIC AND ANAEROBIC Blood Culture adequate volume   Culture  Setup Time   Final    GRAM POSITIVE COCCI IN CLUSTERS AEROBIC BOTTLE ONLY CRITICAL RESULT CALLED TO, READ BACK BY AND VERIFIED WITH: A CHAVIS RN 2119 04/05/20 A BROWNING    Culture (A)  Final    STAPHYLOCOCCUS EPIDERMIDIS STAPHYLOCOCCUS HOMINIS THE SIGNIFICANCE OF ISOLATING THIS ORGANISM FROM A SINGLE SET OF BLOOD CULTURES WHEN MULTIPLE SETS ARE DRAWN IS UNCERTAIN. PLEASE NOTIFY THE MICROBIOLOGY DEPARTMENT WITHIN ONE WEEK IF SPECIATION AND SENSITIVITIES ARE REQUIRED. Performed at [facility], [place] [address] [place], [place] [address]    Report Status 04/06/2020 FINAL  Final  Blood Culture ID Panel (Reflexed)     Status: Abnormal   Collection Time: 04/04/20  6:50 PM  Result Value Ref Range Status   Enterococcus faecalis NOT DETECTED NOT DETECTED Final   Enterococcus Faecium NOT DETECTED NOT DETECTED Final   Listeria monocytogenes NOT DETECTED NOT DETECTED Final   Staphylococcus species DETECTED (A) NOT DETECTED Final    Comment: CRITICAL RESULT CALLED TO, READ BACK BY AND VERIFIED WITH: A CHAVIS RN 2119 04/05/20 A BROWNING    Staphylococcus aureus (BCID) NOT DETECTED NOT DETECTED Final   Staphylococcus epidermidis DETECTED (A) NOT DETECTED Final    Comment: Methicillin (oxacillin) resistant coagulase negative staphylococcus. Possible blood culture contaminant (unless isolated from more than one blood culture draw or clinical case suggests pathogenicity). No antibiotic treatment is indicated for blood  culture contaminants. CRITICAL RESULT CALLED TO, READ BACK BY AND VERIFIED WITH: A CHAVIS RN 2119 04/05/20 A BROWNING    Staphylococcus lugdunensis NOT DETECTED NOT DETECTED Final   Streptococcus species NOT DETECTED NOT  DETECTED Final   Streptococcus agalactiae NOT DETECTED NOT DETECTED Final   Streptococcus pneumoniae NOT DETECTED NOT DETECTED Final   Streptococcus pyogenes NOT DETECTED NOT DETECTED Final   A.calcoaceticus-baumannii NOT DETECTED NOT DETECTED Final   Bacteroides fragilis NOT DETECTED NOT DETECTED Final   Enterobacterales NOT DETECTED NOT DETECTED Final   Enterobacter cloacae complex NOT DETECTED NOT DETECTED Final   Escherichia coli NOT DETECTED NOT DETECTED Final   Klebsiella aerogenes NOT DETECTED NOT DETECTED Final   Klebsiella oxytoca NOT DETECTED NOT DETECTED Final   Klebsiella pneumoniae NOT DETECTED NOT DETECTED Final   Proteus species NOT DETECTED NOT DETECTED Final   Salmonella species NOT DETECTED NOT DETECTED Final   Serratia marcescens NOT DETECTED NOT DETECTED Final   Haemophilus influenzae NOT DETECTED NOT DETECTED Final   Neisseria meningitidis NOT DETECTED NOT DETECTED Final   Pseudomonas aeruginosa NOT DETECTED NOT DETECTED Final   Stenotrophomonas maltophilia NOT DETECTED NOT DETECTED Final   Candida albicans NOT DETECTED NOT DETECTED Final   Candida auris NOT DETECTED  NOT DETECTED Final   Candida glabrata NOT DETECTED NOT DETECTED Final   Candida krusei NOT DETECTED NOT DETECTED Final   Candida parapsilosis NOT DETECTED NOT DETECTED Final   Candida tropicalis NOT DETECTED NOT DETECTED Final   Cryptococcus neoformans/gattii NOT DETECTED NOT DETECTED Final   Methicillin resistance mecA/C DETECTED (A) NOT DETECTED Final    Comment: CRITICAL RESULT CALLED TO, READ BACK BY AND VERIFIED WITH: A CHAVIS RN 2119 04/05/20 A BROWNING Performed at [facility] [facility], [place] [address], [place], [place] [address]   Culture, blood (Routine X 2) w Reflex to ID Panel     Status: None   Collection Time: 04/04/20  6:57 PM   Specimen: BLOOD  Result Value Ref Range Status   Specimen Description BLOOD LEFT ANTECUBITAL  Final   Special Requests   Final    BOTTLES DRAWN AEROBIC AND  ANAEROBIC Blood Culture adequate volume   Culture   Final    NO GROWTH 5 DAYS Performed at [facility], [place] [address], [place], [place] [address]    Report Status 04/09/2020 FINAL  Final  Culture, Urine     Status: Abnormal   Collection Time: 04/04/20  9:56 PM   Specimen: Urine, Random  Result Value Ref Range Status   Specimen Description URINE, RANDOM  Final   Special Requests   Final    NONE Performed at [facility], [place] [address] [place], [place] [address]    Culture (A)  Final    >=100,000 COLONIES/mL MULTIPLE SPECIES PRESENT, SUGGEST RECOLLECTION   Report Status 04/06/2020 FINAL  Final  Culture, blood (routine x 2)     Status: None (Preliminary result)   Collection Time: 04/09/20  3:43 AM   Specimen: BLOOD LEFT HAND  Result Value Ref Range Status   Specimen Description BLOOD LEFT HAND  Final   Special Requests   Final    BOTTLES DRAWN AEROBIC AND ANAEROBIC Blood Culture adequate volume   Culture   Final    NO GROWTH 3 DAYS Performed at [facility], [place] [address] [place], [place] [address]    Report Status PENDING  Incomplete  Culture, blood (routine x 2)     Status: None (Preliminary result)   Collection Time: 04/09/20  3:43 AM   Specimen: BLOOD  Result Value Ref Range Status   Specimen Description BLOOD THUMB LEFT  Final   Special Requests   Final    BOTTLES DRAWN AEROBIC AND ANAEROBIC Blood Culture adequate volume   Culture   Final    NO GROWTH 3 DAYS Performed at [facility], [place] [address] [place], [place] [address]    Report Status PENDING  Incomplete     Inpatient Medications:    Please see MAR   Radiological Exams on Admission: DG CHEST PORT 1 VIEW  Result Date: 04/12/2020 CLINICAL DATA:  Central line placement.  Respiratory failure. EXAM: PORTABLE CHEST 1 VIEW COMPARISON:  04/12/2020 FINDINGS: Endotracheal tube tip 2.8 cm above the carina. AICD noted. Right-sided PICC line tip: SVC. Bilateral airspace opacities with  mildly improved left basilar and right perihilar aeration compared to prior. Thoracic spondylosis. Stable nonspecific accentuated density at the right lung apex with some curvilinear densities along its pulmonary margin, scarring along aerated lung versus tiny stable right pneumothorax. IMPRESSION: 1. Bilateral airspace opacities with mildly improved left basilar and right perihilar aeration compared to prior. 2. Right PICC line tip: SVC. 3. Continued opacity at the right  lung apex with some bandlike densities adjacent to this. Prior exams of question the possibility of a tiny pneumothorax in this vicinity, this is still highly questionable but is not increased compared to previous. Electronically Signed   By: Van Clines M.D.   On: 04/12/2020 14:07   DG CHEST PORT 1 VIEW  Result Date: 04/12/2020 CLINICAL DATA:  Acute respiratory failure. EXAM: PORTABLE CHEST 1 VIEW COMPARISON:  04/11/20 FINDINGS: The tracheostomy tube tip is above the carina. Left chest wall ICD is noted with leads in the right atrial appendage and right ventricle. Similar appearance of lucency along the right lateral margin of the mediastinum extending over the right lung apex. Diffuse bilateral pulmonary opacities are again noted and appear unchanged from previous exam. No new findings. IMPRESSION: 1. Persistent bilateral pulmonary opacities compatible with multifocal pneumonia. 2. Unchanged lucency within the right hemithorax which may reflect small pneumothorax. Electronically Signed   By: Kerby Moors M.D.   On: 04/12/2020 12:21   DG Chest Port 1 View  Result Date: 04/11/2020 CLINICAL DATA:  Respiratory failure. EXAM: PORTABLE CHEST 1 VIEW COMPARISON:  No prior. FINDINGS: Endotracheal tube noted with tip 3 cm above the carina. Pneumomediastinum cannot be excluded. Tiny right apical pneumothorax cannot be excluded. Cardiac pacer with lead tip over the right atrium right ventricle. Cardiomegaly. Tortuous thoracic aorta. Diffuse  bilateral dense pulmonary infiltrates/edema. Small left pleural effusion cannot be excluded. Biapical pleural thickening noted most consistent with scarring. Degenerative changes thoracic spine. IMPRESSION: 1. Endotracheal tube noted with tip 3 cm above the carina. 2. Pneumomediastinum cannot be excluded. Tiny right apical pneumothorax cannot be excluded. Cardiac pacer with lead tips over the right atrium and right ventricle. 3. Diffuse bilateral dense pulmonary infiltrates/edema. 4. Cardiac pacer with lead tips in the right atrium right ventricle. Cardiomegaly. Critical Value/emergent results were called by telephone at the time of interpretation on 04/11/2020 at 10:36 am to provider [facility] , who verbally acknowledged these results. Electronically Signed   By: Marcello Moores  Register   On: 04/11/2020 10:38   DG Abd Portable 1V  Result Date: 04/11/2020 CLINICAL DATA:  NG tube placement EXAM: PORTABLE ABDOMEN - 1 VIEW COMPARISON:  February 29, 2020 FINDINGS: The enteric tube projects over the gastric body. There is a gastrostomy tube projecting over the stomach. The cecum appears to be distended. There are hazy airspace opacities in the lung bases bilaterally. IMPRESSION: 1. Enteric tube projects over the gastric body. Electronically Signed   By: Constance Holster M.D.   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  He recently tested positive for COVID-19 infection.  Chest x-ray showing findings concerning for pneumonia.  Started on empiric vancomycin.  Suggest to add cefepime for gram-negative coverage.  Suggest to send  respiratory cultures.  Monitor for now.  If he is not improving we could do a trial of remdesivir.  Prior to that suggest to check LFTs to make sure his liver enzymes are within normal range before initiating remdesivir. -Patient also needs to be ruled out for pulmonary embolus.  CTA per the primary team.  COVID-19 infection: Patient unfortunately tested positive for COVID-19 here.  Now with worsening chest x-ray findings concerning for pneumonia.  Started on empiric  antibiotics as mentioned above.  Given his acute respiratory failure consider trial of remdesivir.  Steroids per the primary team.  Patient was also given hydroxyurea protocol per facility.  Continue to monitor closely.  If his respiratory status is not improving consider obtaining CT.  -Please monitor BUN/creatinine very closely while on the remdesivir.  It is not recommended for GFR less than 30.  Based on labs today patient's GFR estimated around 50.  Pneumonia: Likely secondary to COVID-19 infection.  He could also possibly have secondary bacterial pneumonia.  Started on empiric antibiotic treatment with IV vancomycin, cefepime.  Suggest to check for respiratory cultures.  We will plan to treat for tentative duration of 1 week pending improvement.  He also has CKD.  Suggest to monitor BUN/creatinine closely while on antibiotics.  He also had leukopenia.  Suggest to monitor CBC as well.  Gram-positive bacteremia: Blood culture showing staph epidermis.  Likely contaminant.  On treatment with IV vancomycin.  Repeat blood cultures pending at this time.  Fournier's gangrene: Patient had Fournier's gangrene and sepsis at the acute facility which has resolved.  He underwent multiple debridement procedures at the outside facility and is status post replacement his graft with bilateral groin wounds, genital and perineal wounds.  Continue local wound care.  Acute inferior/lateral wall MI: Patient found to have elevated troponin and MI.  Cardiology  consulted.  They recommended medical management with statins, metoprolol and Lovenox.  Further management per primary team and cardiology.  Diabetes mellitus type 2: Continue to monitor Accu-Cheks, medications and management for diabetes per the primary team.  Acute on chronic congestive heart failure: Patient apparently had dilated cardiomyopathy with EF 20-25%.  He was given IV Lasix.  Continue further medications and management per primary team and cardiology.  Due to his complex medical problems he is very high risk for worsening and decompensation.  Thank you for this consultation.  Plan of care discussed with the primary team and pharmacy.  Yaakov Guthrie M.D. 04/12/2020, 4:10 PM

## 2020-04-12 NOTE — Consult Note (Signed)
Pulmonary Critical Care Medicine [place]  PULMONARY SERVICE  Date of Service: 04/12/2020  PULMONARY CRITICAL CARE CONSULT   Charles Mcdonald  [id number]  DOB: [birth year]   DOA: 02/29/2020  Referring Physician: Merton Border, MD  HPI: Charles Mcdonald is a 75 y.o. male seen for follow up of Acute on Chronic Respiratory Failure.  Patient has multiple medical problems including chronic congestive heart failure with an ejection fraction of 20 to 25% hypertension hyperlipidemia coronary artery disease atrial fibrillation chronic kidney disease stage III diabetes type 2 gout came into the hospital initially back in November because of penile bleeding.  At that time the patient was found to have an abscess of the penis.  This was drained.  Patient came back into the emergency room with atrial fibrillation and was also hypotensive.  Patient started on antibiotics Zosyn clindamycin as well as vancomycin.  Atrial fibrillation was rate controlled with amiodarone.  Patient subsequently had decompensation with respiratory failure and now is on the ventilator  Review of Systems:  ROS performed and is unremarkable other than noted above.  Past medical history: Atrial fibrillation Chronic heart failure with reduced ejection fraction Diabetes type 2 CKD stage III Hypertension Coronary artery disease  Past Surgical History:  Procedure Laterality Date  . SKIN SPLIT GRAFT Bilateral 03/27/2020   Procedure: SKIN GRAFT SPLIT THICKNESS;  Surgeon: Cindra Presume, MD;  Location: [facility];  Service: Plastics;  Laterality: Bilateral;  . WOUND EXPLORATION Bilateral 03/27/2020   Procedure: Exploration bilateral groin wounds with partial closure;  Surgeon: Cindra Presume, MD;  Location: [facility];  Service: Plastics;  Laterality: Bilateral;    Social History:   Unknown tobacco alcohol or drug abuse  Family History: Non-Contributory to the present illness  Allergies  reviewed  Medications: Reviewed on Rounds  Physical Exam:  Vitals: Temperature is 96.8 pulse 94 respiratory rate 21 blood pressure is 108/78 saturations 100%  Ventilator Settings on the ventilator assist control FiO2 40% tidal volume 500 PEEP of 5  . General: Comfortable at this time . Eyes: Grossly normal lids, irises & conjunctiva . ENT: grossly tongue is normal . Neck: no obvious mass . Cardiovascular: S1-S2 normal no gallop or rub . Respiratory: No rhonchi and no rales . Abdomen: Soft and nontender . Skin: no rash seen on limited exam . Musculoskeletal: not rigid . Psychiatric:unable to assess . Neurologic: no seizure no involuntary movements         Labs on Admission:  Basic Metabolic Panel: Recent Labs  Lab 04/06/20 0830 04/07/20 0429 04/11/20 1426 04/12/20 0630  NA 140 140 139 143  K 3.2* 3.9 3.8 4.1  CL 106 107 103 106  CO2 23 23 19* 21*  GLUCOSE 85 124* 105* 85  BUN 12 21 26* 23  CREATININE 1.57* 1.61* 1.56* 1.48*  CALCIUM 8.5* 8.9 9.0 8.5*  MG  --   --  1.8  --     Recent Labs  Lab 04/11/20 0943 04/11/20 1405  PHART 7.349* 7.357  PCO2ART 34.0 39.3  PO2ART 57.4* 66.2*  HCO3 18.2* 21.5  O2SAT 85.1 90.5    Liver Function Tests: No results for input(s): AST, ALT, ALKPHOS, BILITOT, PROT, ALBUMIN in the last 168 hours. No results for input(s): LIPASE, AMYLASE in the last 168 hours. No results for input(s): AMMONIA in the last 168 hours.  CBC: Recent Labs  Lab 04/07/20 0429 04/08/20 0442 04/11/20 1614 04/12/20 0630  WBC 3.9* 5.3 13.2* 4.5  HGB 11.6* 11.0*  13.7 12.4*  HCT 36.8* 33.9* 42.6 37.0*  MCV 92.5 91.9 92.0 90.9  PLT 319 348 367 304    Cardiac Enzymes: No results for input(s): CKTOTAL, CKMB, CKMBINDEX, TROPONINI in the last 168 hours.  BNP (last 3 results) Recent Labs    04/11/20 1903  BNP 3,412.9*    ProBNP (last 3 results) No results for input(s): PROBNP in the last 8760 hours.   Radiological Exams on Admission: CT  ANGIO CHEST PE W OR WO CONTRAST  Result Date: 04/12/2020 CLINICAL DATA:  PE suspected.  High probability. EXAM: CT ANGIOGRAPHY CHEST WITH CONTRAST TECHNIQUE: Multidetector CT imaging of the chest was performed using the standard protocol during bolus administration of intravenous contrast. Multiplanar CT image reconstructions and MIPs were obtained to evaluate the vascular anatomy. CONTRAST:  65m OMNIPAQUE IOHEXOL 350 MG/ML SOLN COMPARISON:  None. FINDINGS: Cardiovascular: No filling defects within the pulmonary arteries to suggest acute pulmonary embolism. No acute findings of the heart. LEFT-sided pacemaker noted Mediastinum/Nodes: No axillary or supraclavicular adenopathy. No mediastinal or hilar adenopathy. No pericardial fluid. Esophagus normal. Lungs/Pleura: There are large bilateral layering pleural effusions. There is passive atelectasis in the LEFT and RIGHT lower lobe. Perihilar consolidation within RIGHT lower lobe with peripheral nodularity and ground-glass densities (image 66 through 54 series 7). There is perihilar consolidation in the posterosuperior aspect of the RIGHT upper lobe (image 37/7). Chronic bronchiectasis in the medial aspect of the RIGHT upper lobe. Upper Abdomen: Limited view of the liver, kidneys, pancreas are unremarkable. Normal adrenal glands. No aggressive osseous lesion. Musculoskeletal: No aggressive osseous lesion. Review of the MIP images confirms the above findings. IMPRESSION: 1. No evidence acute pulmonary embolism. 2. Airspace consolidation in the perihilar posterior RIGHT upper lobe and RIGHT lower lobe suggest multifocal pneumonia versus aspiration pneumonitis. 3. Large bilateral pleural effusions with passive atelectasis. Electronically Signed   By: SSuzy BouchardM.D.   On: 04/12/2020 17:15   DG CHEST PORT 1 VIEW  Result Date: 04/12/2020 CLINICAL DATA:  Central line placement.  Respiratory failure. EXAM: PORTABLE CHEST 1 VIEW COMPARISON:  04/12/2020 FINDINGS:  Endotracheal tube tip 2.8 cm above the carina. AICD noted. Right-sided PICC line tip: SVC. Bilateral airspace opacities with mildly improved left basilar and right perihilar aeration compared to prior. Thoracic spondylosis. Stable nonspecific accentuated density at the right lung apex with some curvilinear densities along its pulmonary margin, scarring along aerated lung versus tiny stable right pneumothorax. IMPRESSION: 1. Bilateral airspace opacities with mildly improved left basilar and right perihilar aeration compared to prior. 2. Right PICC line tip: SVC. 3. Continued opacity at the right lung apex with some bandlike densities adjacent to this. Prior exams of question the possibility of a tiny pneumothorax in this vicinity, this is still highly questionable but is not increased compared to previous. Electronically Signed   By: WVan ClinesM.D.   On: 04/12/2020 14:07   DG CHEST PORT 1 VIEW  Result Date: 04/12/2020 CLINICAL DATA:  Acute respiratory failure. EXAM: PORTABLE CHEST 1 VIEW COMPARISON:  04/11/20 FINDINGS: The tracheostomy tube tip is above the carina. Left chest wall ICD is noted with leads in the right atrial appendage and right ventricle. Similar appearance of lucency along the right lateral margin of the mediastinum extending over the right lung apex. Diffuse bilateral pulmonary opacities are again noted and appear unchanged from previous exam. No new findings. IMPRESSION: 1. Persistent bilateral pulmonary opacities compatible with multifocal pneumonia. 2. Unchanged lucency within the right hemithorax which may reflect small pneumothorax.  Electronically Signed   By: Kerby Moors M.D.   On: 04/12/2020 12:21   DG Chest Port 1 View  Result Date: 04/11/2020 CLINICAL DATA:  Respiratory failure. EXAM: PORTABLE CHEST 1 VIEW COMPARISON:  No prior. FINDINGS: Endotracheal tube noted with tip 3 cm above the carina. Pneumomediastinum cannot be excluded. Tiny right apical pneumothorax cannot be  excluded. Cardiac pacer with lead tip over the right atrium right ventricle. Cardiomegaly. Tortuous thoracic aorta. Diffuse bilateral dense pulmonary infiltrates/edema. Small left pleural effusion cannot be excluded. Biapical pleural thickening noted most consistent with scarring. Degenerative changes thoracic spine. IMPRESSION: 1. Endotracheal tube noted with tip 3 cm above the carina. 2. Pneumomediastinum cannot be excluded. Tiny right apical pneumothorax cannot be excluded. Cardiac pacer with lead tips over the right atrium and right ventricle. 3. Diffuse bilateral dense pulmonary infiltrates/edema. 4. Cardiac pacer with lead tips in the right atrium right ventricle. Cardiomegaly. Critical Value/emergent results were called by telephone at the time of interpretation on 04/11/2020 at 10:36 am to provider [facility] , who verbally acknowledged these results. Electronically Signed   By: Marcello Moores  Register   On: 04/11/2020 10:38   DG Abd Portable 1V  Result Date: 04/11/2020 CLINICAL DATA:  NG tube placement EXAM: PORTABLE ABDOMEN - 1 VIEW COMPARISON:  February 29, 2020 FINDINGS: The enteric tube projects over the gastric body. There is a gastrostomy tube projecting over the stomach. The cecum appears to be distended. There are hazy airspace opacities in the lung bases bilaterally. IMPRESSION: 1. Enteric tube projects over the gastric body. Electronically Signed   By: Constance Holster M.D.   On: 04/11/2020 22:13    Assessment/Plan Active Problems:   Acute on chronic respiratory failure with hypoxia (HCC)   Chronic HFrEF (heart failure with reduced ejection fraction) (HCC)   Chronic kidney disease, stage III (moderate) (HCC)   Atrial fibrillation, chronic ([place])   1. Acute on chronic respiratory failure hypoxia at this time patient is orally intubated on the ventilator at decompensation respiratory therapy will assess the RSB I mechanics chest film showed diffuse bilateral infiltrates possibility of  pneumomediastinum and pneumothorax cannot be ruled out. 2. Chronic heart failure reduced ejection fraction we will continue to monitor closely. 3. Atrial fibrillation flutter cardiology is seeing the patient for rate control. 4. Chronic kidney disease stage IIIb we will continue to follow labs closely patient's overall prognosis remains guarded  I have personally seen and evaluated the patient, evaluated laboratory and imaging results, formulated the assessment and plan and placed orders. The Patient requires high complexity decision making with multiple systems involvement.  Case was discussed on Rounds with the Respiratory Therapy Director and the Respiratory staff Time Spent 5mnutes  Jazmyne Beauchesne A Seema Blum, MD [facility] PPulmonary Critical Care Medicine Sleep Medicine

## 2020-04-12 NOTE — Progress Notes (Signed)
  Echocardiogram 2D Echocardiogram has been performed.  Charles Mcdonald 04/12/2020, 6:01 PM

## 2020-04-13 DIAGNOSIS — N183 Chronic kidney disease, stage 3 unspecified: Secondary | ICD-10-CM | POA: Diagnosis present

## 2020-04-13 DIAGNOSIS — I5022 Chronic systolic (congestive) heart failure: Secondary | ICD-10-CM | POA: Diagnosis present

## 2020-04-13 DIAGNOSIS — J9621 Acute and chronic respiratory failure with hypoxia: Secondary | ICD-10-CM | POA: Diagnosis not present

## 2020-04-13 DIAGNOSIS — I482 Chronic atrial fibrillation, unspecified: Secondary | ICD-10-CM | POA: Diagnosis not present

## 2020-04-13 DIAGNOSIS — N1832 Chronic kidney disease, stage 3b: Secondary | ICD-10-CM | POA: Diagnosis not present

## 2020-04-13 NOTE — Progress Notes (Signed)
Pulmonary Critical Care Medicine [place]   PULMONARY CRITICAL CARE SERVICE  PROGRESS NOTE  Date of Service: 04/13/2020  Charles Mcdonald  [id number]  DOB: [birth year]   DOA: 02/29/2020  Referring Physician: Merton Border, MD  HPI: Charles Mcdonald is a 75 y.o. male seen for follow up of Acute on Chronic Respiratory Failure.  Patient is on full support on the ventilator has been on assist control mode currently on 40% FiO2 had a CT scan done today  Medications: Reviewed on Rounds  Physical Exam:  Vitals: Temperature is 97.8 pulse 98 respiratory rate 28 blood pressure is 110/70 saturations 100  Ventilator Settings on assist control FiO2 40% tidal volume 500 PEEP of 5  . General: Comfortable at this time . Eyes: Grossly normal lids, irises & conjunctiva . ENT: grossly tongue is normal . Neck: no obvious mass . Cardiovascular: S1 S2 normal no gallop . Respiratory: No rhonchi very coarse breath sounds are noted . Abdomen: soft . Skin: no rash seen on limited exam . Musculoskeletal: not rigid . Psychiatric:unable to assess . Neurologic: no seizure no involuntary movements         Lab Data:   Basic Metabolic Panel: Recent Labs  Lab 04/07/20 0429 04/11/20 1426 04/12/20 0630  NA 140 139 143  K 3.9 3.8 4.1  CL 107 103 106  CO2 23 19* 21*  GLUCOSE 124* 105* 85  BUN 21 26* 23  CREATININE 1.61* 1.56* 1.48*  CALCIUM 8.9 9.0 8.5*  MG  --  1.8  --     ABG: Recent Labs  Lab 04/11/20 0943 04/11/20 1405  PHART 7.349* 7.357  PCO2ART 34.0 39.3  PO2ART 57.4* 66.2*  HCO3 18.2* 21.5  O2SAT 85.1 90.5    Liver Function Tests: No results for input(s): AST, ALT, ALKPHOS, BILITOT, PROT, ALBUMIN in the last 168 hours. No results for input(s): LIPASE, AMYLASE in the last 168 hours. No results for input(s): AMMONIA in the last 168 hours.  CBC: Recent Labs  Lab 04/07/20 0429 04/08/20 0442 04/11/20 1614 04/12/20 0630  WBC 3.9* 5.3 13.2* 4.5  HGB  11.6* 11.0* 13.7 12.4*  HCT 36.8* 33.9* 42.6 37.0*  MCV 92.5 91.9 92.0 90.9  PLT 319 348 367 304    Cardiac Enzymes: No results for input(s): CKTOTAL, CKMB, CKMBINDEX, TROPONINI in the last 168 hours.  BNP (last 3 results) Recent Labs    04/11/20 1903  BNP 3,412.9*    ProBNP (last 3 results) No results for input(s): PROBNP in the last 8760 hours.  Radiological Exams: CT ANGIO CHEST PE W OR WO CONTRAST  Result Date: 04/12/2020 CLINICAL DATA:  PE suspected.  High probability. EXAM: CT ANGIOGRAPHY CHEST WITH CONTRAST TECHNIQUE: Multidetector CT imaging of the chest was performed using the standard protocol during bolus administration of intravenous contrast. Multiplanar CT image reconstructions and MIPs were obtained to evaluate the vascular anatomy. CONTRAST:  40m OMNIPAQUE IOHEXOL 350 MG/ML SOLN COMPARISON:  None. FINDINGS: Cardiovascular: No filling defects within the pulmonary arteries to suggest acute pulmonary embolism. No acute findings of the heart. LEFT-sided pacemaker noted Mediastinum/Nodes: No axillary or supraclavicular adenopathy. No mediastinal or hilar adenopathy. No pericardial fluid. Esophagus normal. Lungs/Pleura: There are large bilateral layering pleural effusions. There is passive atelectasis in the LEFT and RIGHT lower lobe. Perihilar consolidation within RIGHT lower lobe with peripheral nodularity and ground-glass densities (image 66 through 54 series 7). There is perihilar consolidation in the posterosuperior aspect of the RIGHT upper lobe (image 37/7). Chronic  bronchiectasis in the medial aspect of the RIGHT upper lobe. Upper Abdomen: Limited view of the liver, kidneys, pancreas are unremarkable. Normal adrenal glands. No aggressive osseous lesion. Musculoskeletal: No aggressive osseous lesion. Review of the MIP images confirms the above findings. IMPRESSION: 1. No evidence acute pulmonary embolism. 2. Airspace consolidation in the perihilar posterior RIGHT upper lobe  and RIGHT lower lobe suggest multifocal pneumonia versus aspiration pneumonitis. 3. Large bilateral pleural effusions with passive atelectasis. Electronically Signed   By: Suzy Bouchard M.D.   On: 04/12/2020 17:15   DG CHEST PORT 1 VIEW  Result Date: 04/12/2020 CLINICAL DATA:  Central line placement.  Respiratory failure. EXAM: PORTABLE CHEST 1 VIEW COMPARISON:  04/12/2020 FINDINGS: Endotracheal tube tip 2.8 cm above the carina. AICD noted. Right-sided PICC line tip: SVC. Bilateral airspace opacities with mildly improved left basilar and right perihilar aeration compared to prior. Thoracic spondylosis. Stable nonspecific accentuated density at the right lung apex with some curvilinear densities along its pulmonary margin, scarring along aerated lung versus tiny stable right pneumothorax. IMPRESSION: 1. Bilateral airspace opacities with mildly improved left basilar and right perihilar aeration compared to prior. 2. Right PICC line tip: SVC. 3. Continued opacity at the right lung apex with some bandlike densities adjacent to this. Prior exams of question the possibility of a tiny pneumothorax in this vicinity, this is still highly questionable but is not increased compared to previous. Electronically Signed   By: Van Clines M.D.   On: 04/12/2020 14:07   DG CHEST PORT 1 VIEW  Result Date: 04/12/2020 CLINICAL DATA:  Acute respiratory failure. EXAM: PORTABLE CHEST 1 VIEW COMPARISON:  04/11/20 FINDINGS: The tracheostomy tube tip is above the carina. Left chest wall ICD is noted with leads in the right atrial appendage and right ventricle. Similar appearance of lucency along the right lateral margin of the mediastinum extending over the right lung apex. Diffuse bilateral pulmonary opacities are again noted and appear unchanged from previous exam. No new findings. IMPRESSION: 1. Persistent bilateral pulmonary opacities compatible with multifocal pneumonia. 2. Unchanged lucency within the right hemithorax  which may reflect small pneumothorax. Electronically Signed   By: Kerby Moors M.D.   On: 04/12/2020 12:21   DG Abd Portable 1V  Result Date: 04/11/2020 CLINICAL DATA:  NG tube placement EXAM: PORTABLE ABDOMEN - 1 VIEW COMPARISON:  February 29, 2020 FINDINGS: The enteric tube projects over the gastric body. There is a gastrostomy tube projecting over the stomach. The cecum appears to be distended. There are hazy airspace opacities in the lung bases bilaterally. IMPRESSION: 1. Enteric tube projects over the gastric body. Electronically Signed   By: Constance Holster M.D.   On: 04/11/2020 22:13    Assessment/Plan Active Problems:   Acute on chronic respiratory failure with hypoxia (HCC)   Chronic HFrEF (heart failure with reduced ejection fraction) (HCC)   Chronic kidney disease, stage III (moderate) (HCC)   Atrial fibrillation, chronic ([place])   1. Acute on chronic respiratory failure hypoxia we will continue with full support on the vent currently on assist control CT scan was done which revealed large bilateral pleural effusions no PE 2. Chronic heart failure reduced ejection fraction we will continue to monitor fluid status 3. Chronic kidney disease stage III continue supportive care 4. Chronic atrial fibrillation rate is controlled at this time   I have personally seen and evaluated the patient, evaluated laboratory and imaging results, formulated the assessment and plan and placed orders. The Patient requires high complexity decision making  with multiple systems involvement.  Rounds were done with the Respiratory Therapy Director and Staff therapists and discussed with nursing staff also.  Allyne Gee, MD [facility] Pulmonary Critical Care Medicine Sleep Medicine

## 2020-04-14 DIAGNOSIS — N1832 Chronic kidney disease, stage 3b: Secondary | ICD-10-CM | POA: Diagnosis not present

## 2020-04-14 DIAGNOSIS — I482 Chronic atrial fibrillation, unspecified: Secondary | ICD-10-CM | POA: Diagnosis not present

## 2020-04-14 DIAGNOSIS — J9621 Acute and chronic respiratory failure with hypoxia: Secondary | ICD-10-CM | POA: Diagnosis not present

## 2020-04-14 DIAGNOSIS — U071 COVID-19: Secondary | ICD-10-CM

## 2020-04-14 DIAGNOSIS — I5022 Chronic systolic (congestive) heart failure: Secondary | ICD-10-CM | POA: Diagnosis not present

## 2020-04-14 LAB — HEPATIC FUNCTION PANEL
ALT: 41 U/L (ref 0–44)
AST: 41 U/L (ref 15–41)
Albumin: 1.9 g/dL — ABNORMAL LOW (ref 3.5–5.0)
Alkaline Phosphatase: 68 U/L (ref 38–126)
Bilirubin, Direct: <0.1 mg/dL (ref 0.0–0.2)
Total Bilirubin: 0.6 mg/dL (ref 0.3–1.2)
Total Protein: 4.9 g/dL — ABNORMAL LOW (ref 6.5–8.1)

## 2020-04-14 LAB — CULTURE, BLOOD (ROUTINE X 2)
Culture: NO GROWTH
Culture: NO GROWTH
Special Requests: ADEQUATE
Special Requests: ADEQUATE

## 2020-04-14 LAB — BASIC METABOLIC PANEL
Anion gap: 10 (ref 5–15)
BUN: 27 mg/dL — ABNORMAL HIGH (ref 8–23)
CO2: 23 mmol/L (ref 22–32)
Calcium: 8.4 mg/dL — ABNORMAL LOW (ref 8.9–10.3)
Chloride: 107 mmol/L (ref 98–111)
Creatinine, Ser: 1.42 mg/dL — ABNORMAL HIGH (ref 0.61–1.24)
GFR, Estimated: 53 mL/min — ABNORMAL LOW (ref >60)
Glucose, Bld: 212 mg/dL — ABNORMAL HIGH (ref 70–99)
Potassium: 3.4 mmol/L — ABNORMAL LOW (ref 3.5–5.1)
Sodium: 140 mmol/L (ref 135–145)

## 2020-04-14 LAB — CULTURE, RESPIRATORY W GRAM STAIN: Culture: NORMAL

## 2020-04-14 LAB — CBC
HCT: 34.2 % — ABNORMAL LOW (ref 39.0–52.0)
Hemoglobin: 10.9 g/dL — ABNORMAL LOW (ref 13.0–17.0)
MCH: 29.1 pg (ref 26.0–34.0)
MCHC: 31.9 g/dL (ref 30.0–36.0)
MCV: 91.4 fL (ref 80.0–100.0)
Platelets: 275 10*3/uL (ref 150–400)
RBC: 3.74 MIL/uL — ABNORMAL LOW (ref 4.22–5.81)
RDW: 16.8 % — ABNORMAL HIGH (ref 11.5–15.5)
WBC: 3.7 10*3/uL — ABNORMAL LOW (ref 4.0–10.5)
nRBC: 0 % (ref 0.0–0.2)

## 2020-04-14 LAB — VANCOMYCIN, TROUGH: Vancomycin Tr: 19 ug/mL (ref 15–20)

## 2020-04-14 LAB — MAGNESIUM: Magnesium: 1.9 mg/dL (ref 1.7–2.4)

## 2020-04-14 LAB — PROCALCITONIN: Procalcitonin: 1.12 ng/mL

## 2020-04-14 NOTE — Progress Notes (Signed)
Pulmonary Critical Care Medicine [name]   PULMONARY CRITICAL CARE SERVICE  PROGRESS NOTE  Date of Service: 04/14/2020  [name]  [id number]  DOB: 1945/07/20   DOA: 02/29/2020  Referring Physician: Merton Border, MD  HPI: Charles Mcdonald is a 75 y.o. male seen for follow up of Acute on Chronic Respiratory Failure.  Patient remains on full support spoke with the primary care team regarding treatment management.  For the [place] patient is on steroids being seen by infectious disease I would recommend evaluation for remdesivir  Medications: Reviewed on Rounds  Physical Exam:  Vitals: Temperature is 97.3 pulse 73 respiratory 18 blood pressure is 104/64 saturations 100%  Ventilator Settings on assist control FiO2 40% PEEP 5 tidal volume 400  . General: Comfortable at this time . Eyes: Grossly normal lids, irises & conjunctiva . ENT: grossly tongue is normal . Neck: no obvious mass . Cardiovascular: S1 S2 normal no gallop . Respiratory: Scattered coarse breath sounds . Abdomen: soft . Skin: no rash seen on limited exam . Musculoskeletal: not rigid . Psychiatric:unable to assess . Neurologic: no seizure no involuntary movements         Lab Data:   Basic Metabolic Panel: Recent Labs  Lab 04/11/20 1426 04/12/20 0630  NA 139 143  K 3.8 4.1  CL 103 106  CO2 19* 21*  GLUCOSE 105* 85  BUN 26* 23  CREATININE 1.56* 1.48*  CALCIUM 9.0 8.5*  MG 1.8  --     ABG: Recent Labs  Lab 04/11/20 0943 04/11/20 1405  PHART 7.349* 7.357  PCO2ART 34.0 39.3  PO2ART 57.4* 66.2*  HCO3 18.2* 21.5  O2SAT 85.1 90.5    Liver Function Tests: No results for input(s): AST, ALT, ALKPHOS, BILITOT, PROT, ALBUMIN in the last 168 hours. No results for input(s): LIPASE, AMYLASE in the last 168 hours. No results for input(s): AMMONIA in the last 168 hours.  CBC: Recent Labs  Lab 04/08/20 0442 04/11/20 1614 04/12/20 0630  WBC 5.3 13.2* 4.5  HGB 11.0* 13.7  12.4*  HCT 33.9* 42.6 37.0*  MCV 91.9 92.0 90.9  PLT 348 367 304    Cardiac Enzymes: No results for input(s): CKTOTAL, CKMB, CKMBINDEX, TROPONINI in the last 168 hours.  BNP (last 3 results) Recent Labs    04/11/20 1903  BNP 3,412.9*    ProBNP (last 3 results) No results for input(s): PROBNP in the last 8760 hours.  Radiological Exams: CT ANGIO CHEST PE W OR WO CONTRAST  Result Date: 04/12/2020 CLINICAL DATA:  PE suspected.  High probability. EXAM: CT ANGIOGRAPHY CHEST WITH CONTRAST TECHNIQUE: Multidetector CT imaging of the chest was performed using the standard protocol during bolus administration of intravenous contrast. Multiplanar CT image reconstructions and MIPs were obtained to evaluate the vascular anatomy. CONTRAST:  76m OMNIPAQUE IOHEXOL 350 MG/ML SOLN COMPARISON:  None. FINDINGS: Cardiovascular: No filling defects within the pulmonary arteries to suggest acute pulmonary embolism. No acute findings of the heart. LEFT-sided pacemaker noted Mediastinum/Nodes: No axillary or supraclavicular adenopathy. No mediastinal or hilar adenopathy. No pericardial fluid. Esophagus normal. Lungs/Pleura: There are large bilateral layering pleural effusions. There is passive atelectasis in the LEFT and RIGHT lower lobe. Perihilar consolidation within RIGHT lower lobe with peripheral nodularity and ground-glass densities (image 66 through 54 series 7). There is perihilar consolidation in the posterosuperior aspect of the RIGHT upper lobe (image 37/7). Chronic bronchiectasis in the medial aspect of the RIGHT upper lobe. Upper Abdomen: Limited view of the liver,  kidneys, pancreas are unremarkable. Normal adrenal glands. No aggressive osseous lesion. Musculoskeletal: No aggressive osseous lesion. Review of the MIP images confirms the above findings. IMPRESSION: 1. No evidence acute pulmonary embolism. 2. Airspace consolidation in the perihilar posterior RIGHT upper lobe and RIGHT lower lobe suggest  multifocal pneumonia versus aspiration pneumonitis. 3. Large bilateral pleural effusions with passive atelectasis. Electronically Signed   By: Suzy Bouchard M.D.   On: 04/12/2020 17:15   DG CHEST PORT 1 VIEW  Result Date: 04/12/2020 CLINICAL DATA:  Central line placement.  Respiratory failure. EXAM: PORTABLE CHEST 1 VIEW COMPARISON:  04/12/2020 FINDINGS: Endotracheal tube tip 2.8 cm above the carina. AICD noted. Right-sided PICC line tip: SVC. Bilateral airspace opacities with mildly improved left basilar and right perihilar aeration compared to prior. Thoracic spondylosis. Stable nonspecific accentuated density at the right lung apex with some curvilinear densities along its pulmonary margin, scarring along aerated lung versus tiny stable right pneumothorax. IMPRESSION: 1. Bilateral airspace opacities with mildly improved left basilar and right perihilar aeration compared to prior. 2. Right PICC line tip: SVC. 3. Continued opacity at the right lung apex with some bandlike densities adjacent to this. Prior exams of question the possibility of a tiny pneumothorax in this vicinity, this is still highly questionable but is not increased compared to previous. Electronically Signed   By: Van Clines M.D.   On: 04/12/2020 14:07   DG CHEST PORT 1 VIEW  Result Date: 04/12/2020 CLINICAL DATA:  Acute respiratory failure. EXAM: PORTABLE CHEST 1 VIEW COMPARISON:  04/11/20 FINDINGS: The tracheostomy tube tip is above the carina. Left chest wall ICD is noted with leads in the right atrial appendage and right ventricle. Similar appearance of lucency along the right lateral margin of the mediastinum extending over the right lung apex. Diffuse bilateral pulmonary opacities are again noted and appear unchanged from previous exam. No new findings. IMPRESSION: 1. Persistent bilateral pulmonary opacities compatible with multifocal pneumonia. 2. Unchanged lucency within the right hemithorax which may reflect small  pneumothorax. Electronically Signed   By: Kerby Moors M.D.   On: 04/12/2020 12:21    Assessment/Plan Active Problems:   Acute on chronic respiratory failure with hypoxia (HCC)   Chronic HFrEF (heart failure with reduced ejection fraction) (HCC)   Chronic kidney disease, stage III (moderate) (HCC)   Atrial fibrillation, chronic ([place])   1. Acute on chronic respiratory failure with hypoxia patient continues on the ventilator and full support right now is on sedation also with propofol.  Spoke with respiratory therapy team and nursing staff to try to wean the propofol down and see if we are able to do any weaning. 2. 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 3. Chronic kidney disease stage III following the labs. 4. Chronic atrial fibrillation rate is controlled 5. COVID-19 virus infection patient with acute infection has been started on steroids infectious disease has also been asked to see the patient waiting on getting remdesivir patient as already mentioned is already on steroids and empiric antibiotics. 6. Pleural effusion may benefit from thoracentesis once medically is cleared   I have personally seen and evaluated the patient, evaluated laboratory and imaging results, formulated the assessment and plan and placed orders. The Patient requires high complexity decision making with multiple systems involvement.  Rounds were done with the Respiratory Therapy Director and Staff therapists and discussed with nursing staff also.  Allyne Gee, MD [facility] Pulmonary Critical  Care Medicine Sleep Medicine

## 2020-04-15 ENCOUNTER — Other Ambulatory Visit (HOSPITAL_COMMUNITY): Payer: Medicare Other

## 2020-04-15 DIAGNOSIS — I482 Chronic atrial fibrillation, unspecified: Secondary | ICD-10-CM | POA: Diagnosis not present

## 2020-04-15 DIAGNOSIS — J9621 Acute and chronic respiratory failure with hypoxia: Secondary | ICD-10-CM | POA: Diagnosis not present

## 2020-04-15 DIAGNOSIS — N1832 Chronic kidney disease, stage 3b: Secondary | ICD-10-CM | POA: Diagnosis not present

## 2020-04-15 DIAGNOSIS — I5022 Chronic systolic (congestive) heart failure: Secondary | ICD-10-CM | POA: Diagnosis not present

## 2020-04-15 LAB — BLOOD GAS, ARTERIAL
Acid-Base Excess: 1.6 mmol/L (ref 0.0–2.0)
Bicarbonate: 25.7 mmol/L (ref 20.0–28.0)
FIO2: 30
O2 Saturation: 97.4 %
Patient temperature: 37
pCO2 arterial: 40.2 mmHg (ref 32.0–48.0)
pH, Arterial: 7.421 (ref 7.350–7.450)
pO2, Arterial: 95.1 mmHg (ref 83.0–108.0)

## 2020-04-15 LAB — PROCALCITONIN: Procalcitonin: 0.73 ng/mL

## 2020-04-15 LAB — POTASSIUM: Potassium: 4.1 mmol/L (ref 3.5–5.1)

## 2020-04-15 NOTE — Progress Notes (Addendum)
Pulmonary Critical Care Medicine [place]   PULMONARY CRITICAL CARE SERVICE  PROGRESS NOTE  Date of Service: 04/15/2020  Charles Mcdonald  [id number]  DOB: 07/28/1945   DOA: 02/29/2020  Referring Physician: Merton Border, MD  HPI: Charles Mcdonald is a 75 y.o. male seen for follow up of Acute on Chronic Respiratory Failure.  He is doing well today and was on pressure support 30% on a pressure of 5/5  Medications: Reviewed on Rounds  Physical Exam:  Vitals: Temperature is 97.9 pulse 90 respiratory rate 17 blood pressure is 122/83 saturations 100%  Ventilator Settings on pressure support FiO2 30% pressure 5/5  . General: Comfortable at this time . Eyes: Grossly normal lids, irises & conjunctiva . ENT: grossly tongue is normal . Neck: no obvious mass . Cardiovascular: S1 S2 normal no gallop . Respiratory: No rhonchi coarse breath sound . Abdomen: soft . Skin: no rash seen on limited exam . Musculoskeletal: not rigid . Psychiatric:unable to assess . Neurologic: no seizure no involuntary movements         Lab Data:   Basic Metabolic Panel: Recent Labs  Lab 04/11/20 1426 04/12/20 0630 04/14/20 0956  NA 139 143 140  K 3.8 4.1 3.4*  CL 103 106 107  CO2 19* 21* 23  GLUCOSE 105* 85 212*  BUN 26* 23 27*  CREATININE 1.56* 1.48* 1.42*  CALCIUM 9.0 8.5* 8.4*  MG 1.8  --  1.9    ABG: Recent Labs  Lab 04/11/20 0943 04/11/20 1405  PHART 7.349* 7.357  PCO2ART 34.0 39.3  PO2ART 57.4* 66.2*  HCO3 18.2* 21.5  O2SAT 85.1 90.5    Liver Function Tests: Recent Labs  Lab 04/14/20 0956  AST 41  ALT 41  ALKPHOS 68  BILITOT 0.6  PROT 4.9*  ALBUMIN 1.9*   No results for input(s): LIPASE, AMYLASE in the last 168 hours. No results for input(s): AMMONIA in the last 168 hours.  CBC: Recent Labs  Lab 04/11/20 1614 04/12/20 0630 04/14/20 0956  WBC 13.2* 4.5 3.7*  HGB 13.7 12.4* 10.9*  HCT 42.6 37.0* 34.2*  MCV 92.0 90.9 91.4  PLT 367 304  275    Cardiac Enzymes: No results for input(s): CKTOTAL, CKMB, CKMBINDEX, TROPONINI in the last 168 hours.  BNP (last 3 results) Recent Labs    04/11/20 1903  BNP 3,412.9*    ProBNP (last 3 results) No results for input(s): PROBNP in the last 8760 hours.  Radiological Exams: DG CHEST PORT 1 VIEW  Result Date: 04/15/2020 CLINICAL DATA:  COVID-19. EXAM: PORTABLE CHEST 1 VIEW COMPARISON:  Chest radiograph 04/12/2020. FINDINGS: ET tube mid trachea. Right upper extremity PICC line tip projects over the superior vena cava. Multi lead AICD device overlies the left hemithorax. Stable cardiac and mediastinal contours. Improved consolidation within the right mid lung. Similar-appearing patchy consolidative opacities within the left mid and lower lung. Layering left and small right pleural effusion. No pneumothorax. IMPRESSION: Improved consolidation within the right mid lung. Similar-appearing patchy opacities within the left mid and lower lung. Bilateral layering pleural effusions. Electronically Signed   By: Lovey Newcomer M.D.   On: 04/15/2020 08:04    Assessment/Plan Active Problems:   Acute on chronic respiratory failure with hypoxia (HCC)   Chronic HFrEF (heart failure with reduced ejection fraction) (HCC)   Chronic kidney disease, stage III (moderate) (HCC)   Atrial fibrillation, chronic ([place])   1. Acute on chronic respiratory failure with hypoxia we will continue with the weaning  on pressure support titrate oxygen as tolerated.  I have discussed the case with the respiratory therapist and we are going to go ahead and work towards extubation ABG will be done if the ABG looks good we will extubate and probably placed on BiPAP postextubation 2. Failure reduced ejection fraction we will continue with supportive care 3. Chronic kidney disease stage III follow-up on labs 4. Chronic atrial fibrillation rate is controlled   I have personally seen and evaluated the patient, evaluated  laboratory and imaging results, formulated the assessment and plan and placed orders. The Patient requires high complexity decision making with multiple systems involvement.  Rounds were done with the Respiratory Therapy Director and Staff therapists and discussed with nursing staff also.  Allyne Gee, MD [facility] Pulmonary Critical Care Medicine Sleep Medicine

## 2020-04-16 DIAGNOSIS — J9621 Acute and chronic respiratory failure with hypoxia: Secondary | ICD-10-CM | POA: Diagnosis not present

## 2020-04-16 DIAGNOSIS — I5022 Chronic systolic (congestive) heart failure: Secondary | ICD-10-CM | POA: Diagnosis not present

## 2020-04-16 DIAGNOSIS — I482 Chronic atrial fibrillation, unspecified: Secondary | ICD-10-CM | POA: Diagnosis not present

## 2020-04-16 DIAGNOSIS — N1832 Chronic kidney disease, stage 3b: Secondary | ICD-10-CM | POA: Diagnosis not present

## 2020-04-16 LAB — PROCALCITONIN: Procalcitonin: 0.42 ng/mL

## 2020-04-16 NOTE — Progress Notes (Signed)
Pulmonary Critical Care Medicine [place]   PULMONARY CRITICAL CARE SERVICE  PROGRESS NOTE  Date of Service: 04/16/2020  Charles Mcdonald  [id number]  DOB: 1945-11-30   DOA: 02/29/2020  Referring Physician: Merton Border, MD  HPI: Charles Mcdonald is a 75 y.o. male seen for follow up of Acute on Chronic Respiratory Failure.  Doing well after extubation yesterday looks comfortable  Medications: Reviewed on Rounds  Physical Exam:  Vitals: Temperature is 97.6 pulse 81 respiratory rate 14 blood pressure is 123/77 saturations 94%  Ventilator Settings extubated off the vent  . General: Comfortable at this time . Eyes: Grossly normal lids, irises & conjunctiva . ENT: grossly tongue is normal . Neck: no obvious mass . Cardiovascular: S1 S2 normal no gallop . Respiratory: No rhonchi very coarse breath sound . Abdomen: soft . Skin: no rash seen on limited exam . Musculoskeletal: not rigid . Psychiatric:unable to assess . Neurologic: no seizure no involuntary movements         Lab Data:   Basic Metabolic Panel: Recent Labs  Lab 04/11/20 1426 04/12/20 0630 04/14/20 0956 04/15/20 0723  NA 139 143 140  --   K 3.8 4.1 3.4* 4.1  CL 103 106 107  --   CO2 19* 21* 23  --   GLUCOSE 105* 85 212*  --   BUN 26* 23 27*  --   CREATININE 1.56* 1.48* 1.42*  --   CALCIUM 9.0 8.5* 8.4*  --   MG 1.8  --  1.9  --     ABG: Recent Labs  Lab 04/11/20 0943 04/11/20 1405 04/15/20 0938  PHART 7.349* 7.357 7.421  PCO2ART 34.0 39.3 40.2  PO2ART 57.4* 66.2* 95.1  HCO3 18.2* 21.5 25.7  O2SAT 85.1 90.5 97.4    Liver Function Tests: Recent Labs  Lab 04/14/20 0956  AST 41  ALT 41  ALKPHOS 68  BILITOT 0.6  PROT 4.9*  ALBUMIN 1.9*   No results for input(s): LIPASE, AMYLASE in the last 168 hours. No results for input(s): AMMONIA in the last 168 hours.  CBC: Recent Labs  Lab 04/11/20 1614 04/12/20 0630 04/14/20 0956  WBC 13.2* 4.5 3.7*  HGB 13.7 12.4*  10.9*  HCT 42.6 37.0* 34.2*  MCV 92.0 90.9 91.4  PLT 367 304 275    Cardiac Enzymes: No results for input(s): CKTOTAL, CKMB, CKMBINDEX, TROPONINI in the last 168 hours.  BNP (last 3 results) Recent Labs    04/11/20 1903  BNP 3,412.9*    ProBNP (last 3 results) No results for input(s): PROBNP in the last 8760 hours.  Radiological Exams: DG CHEST PORT 1 VIEW  Result Date: 04/15/2020 CLINICAL DATA:  COVID-19. EXAM: PORTABLE CHEST 1 VIEW COMPARISON:  Chest radiograph 04/12/2020. FINDINGS: ET tube mid trachea. Right upper extremity PICC line tip projects over the superior vena cava. Multi lead AICD device overlies the left hemithorax. Stable cardiac and mediastinal contours. Improved consolidation within the right mid lung. Similar-appearing patchy consolidative opacities within the left mid and lower lung. Layering left and small right pleural effusion. No pneumothorax. IMPRESSION: Improved consolidation within the right mid lung. Similar-appearing patchy opacities within the left mid and lower lung. Bilateral layering pleural effusions. Electronically Signed   By: Lovey Newcomer M.D.   On: 04/15/2020 08:04    Assessment/Plan Active Problems:   Acute on chronic respiratory failure with hypoxia (HCC)   Chronic HFrEF (heart failure with reduced ejection fraction) (HCC)   Chronic kidney disease, stage III (moderate) (  [place])   Atrial fibrillation, chronic ([place])   1. Acute on chronic respiratory failure hypoxia we will continue with supportive care oxygen as necessary 2. Chronic heart failure reduced ejection fraction no change 3. Chronic kidney disease stage III improving labs we will continue to monitor 4. Chronic atrial fibrillation rate controlled   I have personally seen and evaluated the patient, evaluated laboratory and imaging results, formulated the assessment and plan and placed orders. The Patient requires high complexity decision making with multiple systems involvement.   Rounds were done with the Respiratory Therapy Director and Staff therapists and discussed with nursing staff also.  Allyne Gee, MD [facility] Pulmonary Critical Care Medicine Sleep Medicine

## 2020-04-16 NOTE — Progress Notes (Signed)
20 Days Post-Op  Subjective: Patient is 20 days postop from excision of bilateral groin wounds, perineal and genital wounds with split-thickness skin grafts with Dr. Claudia Desanctis.  Patient reports he is doing well.  Patient was diagnosed with Covid on 04/04/2020.  Patient was intubated, recently extubated yesterday.  He reports no fevers, chills, nausea, vomiting.  Objective: Vital signs in last 24 hours:      Intake/Output from previous day: No intake/output data recorded. Intake/Output this shift: No intake/output data recorded.  General appearance: alert, cooperative and No distress, sitting up in bed Resp: Unlabored Incision/Wound: Groin, genital and perineal split-thickness skin grafts overall appear well-healed with 2 areas of exposed granulation tissue.  One area is along the right suprapubic area and an additional area within the perineum.  There is no surrounding erythema.  There is no foul odor is noted.  Bilateral thigh split-thickness skin graft donor sites healing well.  New epithelium noted.  No foul odors or surrounding erythema noted.  Lab Results:  CBC Latest Ref Rng & Units 04/14/2020 04/12/2020 04/11/2020  WBC 4.0 - 10.5 K/uL 3.7(L) 4.5 13.2(H)  Hemoglobin 13.0 - 17.0 g/dL 10.9(L) 12.4(L) 13.7  Hematocrit 39.0 - 52.0 % 34.2(L) 37.0(L) 42.6  Platelets 150 - 400 K/uL 275 304 367    BMET Recent Labs    04/14/20 0956 04/15/20 0723  NA 140  --   K 3.4* 4.1  CL 107  --   CO2 23  --   GLUCOSE 212*  --   BUN 27*  --   CREATININE 1.42*  --   CALCIUM 8.4*  --    PT/INR No results for input(s): LABPROT, INR in the last 72 hours. ABG Recent Labs    04/15/20 0938  PHART 7.421  HCO3 25.7    Studies/Results: DG CHEST PORT 1 VIEW  Result Date: 04/15/2020 CLINICAL DATA:  COVID-19. EXAM: PORTABLE CHEST 1 VIEW COMPARISON:  Chest radiograph 04/12/2020. FINDINGS: ET tube mid trachea. Right upper extremity PICC line tip projects over the superior vena cava. Multi lead AICD  device overlies the left hemithorax. Stable cardiac and mediastinal contours. Improved consolidation within the right mid lung. Similar-appearing patchy consolidative opacities within the left mid and lower lung. Layering left and small right pleural effusion. No pneumothorax. IMPRESSION: Improved consolidation within the right mid lung. Similar-appearing patchy opacities within the left mid and lower lung. Bilateral layering pleural effusions. Electronically Signed   By: Lovey Newcomer M.D.   On: 04/15/2020 08:04    Anti-infectives: Anti-infectives (From admission, onward)   Start     Dose/Rate Route Frequency Ordered Stop   03/28/20 0600  ceFAZolin (ANCEF) IVPB 2g/100 mL premix        2 g 200 mL/hr over 30 Minutes Intravenous On call to O.R. 03/27/20 1305 03/27/20 1510      Assessment/Plan: s/p Procedure(s): Exploration bilateral groin wounds with partial closure SKIN GRAFT SPLIT THICKNESS  Patient is doing well in regards to his bilateral groin wounds.  Recommend continuing with Xeroform, 4 x 4 gauze, ABD, secure with Medipore tape [facility]  [facility] with [facility] RN for dressing changes at home every other day.   I change the patient's dressing today to reveal stable split-thickness skin graft recipient and donor sites.  Recommend continuing to work on nutritional status to optimize healing.  Discussed with patient to call our office once he is discharged and schedule a follow-up in 1 to 2  weeks after discharge.  Recommend calling with any questions or concerns.  Pictures were taken of the patient's groin wound and placed in the patient's chart with his consent.      LOS: 0 days    Charlies Constable, PA-C 04/16/2020 \

## 2020-04-17 ENCOUNTER — Other Ambulatory Visit (HOSPITAL_COMMUNITY): Payer: Medicare Other

## 2020-04-17 HISTORY — PX: IR THORACENTESIS ASP PLEURAL SPACE W/IMG GUIDE: IMG5380

## 2020-04-17 LAB — CBC
HCT: 33.2 % — ABNORMAL LOW (ref 39.0–52.0)
Hemoglobin: 10.4 g/dL — ABNORMAL LOW (ref 13.0–17.0)
MCH: 28.8 pg (ref 26.0–34.0)
MCHC: 31.3 g/dL (ref 30.0–36.0)
MCV: 92 fL (ref 80.0–100.0)
Platelets: 248 10*3/uL (ref 150–400)
RBC: 3.61 MIL/uL — ABNORMAL LOW (ref 4.22–5.81)
RDW: 16.9 % — ABNORMAL HIGH (ref 11.5–15.5)
WBC: 6 10*3/uL (ref 4.0–10.5)
nRBC: 0 % (ref 0.0–0.2)

## 2020-04-17 LAB — BASIC METABOLIC PANEL
Anion gap: 11 (ref 5–15)
BUN: 37 mg/dL — ABNORMAL HIGH (ref 8–23)
CO2: 26 mmol/L (ref 22–32)
Calcium: 9.1 mg/dL (ref 8.9–10.3)
Chloride: 106 mmol/L (ref 98–111)
Creatinine, Ser: 1.39 mg/dL — ABNORMAL HIGH (ref 0.61–1.24)
GFR, Estimated: 54 mL/min — ABNORMAL LOW (ref >60)
Glucose, Bld: 172 mg/dL — ABNORMAL HIGH (ref 70–99)
Potassium: 4 mmol/L (ref 3.5–5.1)
Sodium: 143 mmol/L (ref 135–145)

## 2020-04-17 LAB — VANCOMYCIN, TROUGH: Vancomycin Tr: 19 ug/mL (ref 15–20)

## 2020-04-17 LAB — MAGNESIUM: Magnesium: 2.1 mg/dL (ref 1.7–2.4)

## 2020-04-17 MED ORDER — LIDOCAINE HCL 1 % IJ SOLN
INTRAMUSCULAR | Status: DC | PRN
  Administered 2020-04-17: 10 mL

## 2020-04-17 MED ORDER — LIDOCAINE HCL 1 % IJ SOLN
INTRAMUSCULAR | Status: AC
  Filled 2020-04-17: qty 20

## 2020-04-17 NOTE — Procedures (Signed)
Ultrasound-guided  therapeutic left thoracentesis performed yielding 1.3 liters of yellow fluid. No immediate complications. Follow-up chest x-ray pending. EBL< 1 cc.

## 2020-04-19 LAB — BASIC METABOLIC PANEL
Anion gap: 12 (ref 5–15)
BUN: 45 mg/dL — ABNORMAL HIGH (ref 8–23)
CO2: 33 mmol/L — ABNORMAL HIGH (ref 22–32)
Calcium: 8.9 mg/dL (ref 8.9–10.3)
Chloride: 97 mmol/L — ABNORMAL LOW (ref 98–111)
Creatinine, Ser: 1.55 mg/dL — ABNORMAL HIGH (ref 0.61–1.24)
GFR, Estimated: 47 mL/min — ABNORMAL LOW (ref >60)
Glucose, Bld: 76 mg/dL (ref 70–99)
Potassium: 3.3 mmol/L — ABNORMAL LOW (ref 3.5–5.1)
Sodium: 142 mmol/L (ref 135–145)

## 2020-04-23 LAB — ECHOCARDIOGRAM LIMITED

## 2020-04-25 ENCOUNTER — Encounter: Payer: Self-pay | Admitting: Plastic Surgery

## 2020-04-25 ENCOUNTER — Other Ambulatory Visit: Payer: Self-pay

## 2020-04-25 ENCOUNTER — Ambulatory Visit (INDEPENDENT_AMBULATORY_CARE_PROVIDER_SITE_OTHER): Payer: Medicare Other | Admitting: Plastic Surgery

## 2020-04-25 VITALS — BP 120/72 | HR 88 | Ht 72.0 in | Wt 180.0 lb

## 2020-04-25 DIAGNOSIS — S31109D Unspecified open wound of abdominal wall, unspecified quadrant without penetration into peritoneal cavity, subsequent encounter: Secondary | ICD-10-CM

## 2020-04-25 NOTE — Progress Notes (Signed)
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  I did split-thickness skin graft to these areas about a month ago.  On examination they look almost completely healed.  There is 1 area between his testicle and the skin fold that has some sloughing epithelium but otherwise everything else is healed up.  I gave him instructions on wound care to this area which can be simplified.  We will plan to see him again in about a month.

## 2020-05-23 ENCOUNTER — Ambulatory Visit (INDEPENDENT_AMBULATORY_CARE_PROVIDER_SITE_OTHER): Payer: Medicare Other | Admitting: Surgical

## 2020-05-23 ENCOUNTER — Other Ambulatory Visit: Payer: Self-pay

## 2020-05-23 ENCOUNTER — Encounter: Payer: Self-pay | Admitting: Surgical

## 2020-05-23 VITALS — BP 146/81 | HR 84

## 2020-05-23 DIAGNOSIS — S31109D Unspecified open wound of abdominal wall, unspecified quadrant without penetration into peritoneal cavity, subsequent encounter: Secondary | ICD-10-CM

## 2020-05-23 NOTE — Progress Notes (Signed)
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  Patient underwent the split-thickness skin grafts to these areas on 03/27/2020 with Dr. Claudia Desanctis.  He is approximately 2 months postop.  Patient reports he is overall doing well, he reports he has seen his primary care doctor who has set him up with urology to be evaluated due to the indwelling catheter still in place.  He reports the appointment is next week.  He reports he has not had any infectious symptoms.  He is not having any fevers, chills, nausea, vomiting.  He reports that he has noticed some swelling of his left lateral groin at the end of the skin graft.  On exam bilateral testes, bilateral groin wounds are well-healed with the skin graft in place and well-healed.  There is no erythema.  No foul odor is noted.  He does have some bleeding from his indwelling catheter.  There is no foul odors or cellulitic changes noted.  He reports normal function of the catheter.  Penis graft has well-healed as well.  He does have some swelling along the left lateral groin.  It is a localized collection of fluid that is approximately 3 x 2 cm.  There is no overlying skin change.  No excessive warmth.  No erythema. I aspirated this area using sterile technique and butterfly needle.  I was able to aspirate approximately 10 cc of serosanguineous fluid from this area.  There was no purulence noted.  I recommend he keep a Band-Aid on this place for the next 24 to 36 hours and then he can leave it uncovered.  Patient's bilateral groin wounds, testes and penile wounds are well-healed.  Skin grafts are in place and there is no wounds noted.  I discussed with the patient that at this point there is no need for additional follow-up with our office unless he has any questions, concerns or if his symptoms change.  There is no sign of infection within the groin.  Patient is in  agreement with the plan to follow-up on an as-needed basis, he reports he is very appreciative of our assistance and his recovery.

## 2020-06-16 ENCOUNTER — Other Ambulatory Visit: Payer: Self-pay

## 2020-06-16 ENCOUNTER — Encounter (HOSPITAL_COMMUNITY): Payer: Self-pay | Admitting: Emergency Medicine

## 2020-06-16 ENCOUNTER — Inpatient Hospital Stay (HOSPITAL_COMMUNITY): Payer: Medicare Other

## 2020-06-16 ENCOUNTER — Emergency Department (HOSPITAL_COMMUNITY): Payer: Medicare Other

## 2020-06-16 ENCOUNTER — Inpatient Hospital Stay (HOSPITAL_COMMUNITY)
Admission: EM | Admit: 2020-06-16 | Discharge: 2020-06-19 | DRG: 309 | Disposition: A | Discharge status: Home / Self Care | Payer: Medicare Other | Attending: Cardiovascular Disease | Admitting: Cardiovascular Disease

## 2020-06-16 DIAGNOSIS — Z833 Family history of diabetes mellitus: Secondary | ICD-10-CM

## 2020-06-16 DIAGNOSIS — Z20822 Contact with and (suspected) exposure to covid-19: Secondary | ICD-10-CM | POA: Diagnosis present

## 2020-06-16 DIAGNOSIS — I472 Ventricular tachycardia, unspecified: Secondary | ICD-10-CM

## 2020-06-16 DIAGNOSIS — E118 Type 2 diabetes mellitus with unspecified complications: Secondary | ICD-10-CM

## 2020-06-16 DIAGNOSIS — Z8249 Family history of ischemic heart disease and other diseases of the circulatory system: Secondary | ICD-10-CM

## 2020-06-16 DIAGNOSIS — Z931 Gastrostomy status: Secondary | ICD-10-CM

## 2020-06-16 DIAGNOSIS — Z87891 Personal history of nicotine dependence: Secondary | ICD-10-CM

## 2020-06-16 DIAGNOSIS — I5043 Acute on chronic combined systolic (congestive) and diastolic (congestive) heart failure: Secondary | ICD-10-CM | POA: Diagnosis not present

## 2020-06-16 DIAGNOSIS — I495 Sick sinus syndrome: Secondary | ICD-10-CM | POA: Diagnosis present

## 2020-06-16 DIAGNOSIS — E876 Hypokalemia: Secondary | ICD-10-CM | POA: Diagnosis present

## 2020-06-16 DIAGNOSIS — Z933 Colostomy status: Secondary | ICD-10-CM

## 2020-06-16 DIAGNOSIS — Z79899 Other long term (current) drug therapy: Secondary | ICD-10-CM | POA: Diagnosis not present

## 2020-06-16 DIAGNOSIS — I480 Paroxysmal atrial fibrillation: Secondary | ICD-10-CM

## 2020-06-16 DIAGNOSIS — I5022 Chronic systolic (congestive) heart failure: Secondary | ICD-10-CM | POA: Diagnosis present

## 2020-06-16 DIAGNOSIS — Z841 Family history of disorders of kidney and ureter: Secondary | ICD-10-CM | POA: Diagnosis not present

## 2020-06-16 DIAGNOSIS — I2510 Atherosclerotic heart disease of native coronary artery without angina pectoris: Secondary | ICD-10-CM | POA: Diagnosis present

## 2020-06-16 DIAGNOSIS — J9621 Acute and chronic respiratory failure with hypoxia: Secondary | ICD-10-CM | POA: Diagnosis present

## 2020-06-16 DIAGNOSIS — I255 Ischemic cardiomyopathy: Secondary | ICD-10-CM | POA: Diagnosis present

## 2020-06-16 DIAGNOSIS — I5021 Acute systolic (congestive) heart failure: Secondary | ICD-10-CM

## 2020-06-16 DIAGNOSIS — Z4502 Encounter for adjustment and management of automatic implantable cardiac defibrillator: Secondary | ICD-10-CM

## 2020-06-16 DIAGNOSIS — E785 Hyperlipidemia, unspecified: Secondary | ICD-10-CM | POA: Diagnosis present

## 2020-06-16 DIAGNOSIS — Z794 Long term (current) use of insulin: Secondary | ICD-10-CM | POA: Diagnosis not present

## 2020-06-16 DIAGNOSIS — Z7901 Long term (current) use of anticoagulants: Secondary | ICD-10-CM | POA: Diagnosis not present

## 2020-06-16 DIAGNOSIS — I5042 Chronic combined systolic (congestive) and diastolic (congestive) heart failure: Secondary | ICD-10-CM | POA: Diagnosis present

## 2020-06-16 DIAGNOSIS — E1122 Type 2 diabetes mellitus with diabetic chronic kidney disease: Secondary | ICD-10-CM | POA: Diagnosis present

## 2020-06-16 DIAGNOSIS — I252 Old myocardial infarction: Secondary | ICD-10-CM | POA: Diagnosis not present

## 2020-06-16 DIAGNOSIS — I130 Hypertensive heart and chronic kidney disease with heart failure and stage 1 through stage 4 chronic kidney disease, or unspecified chronic kidney disease: Secondary | ICD-10-CM | POA: Diagnosis present

## 2020-06-16 DIAGNOSIS — I420 Dilated cardiomyopathy: Secondary | ICD-10-CM | POA: Diagnosis not present

## 2020-06-16 DIAGNOSIS — I4891 Unspecified atrial fibrillation: Secondary | ICD-10-CM | POA: Diagnosis present

## 2020-06-16 DIAGNOSIS — N183 Chronic kidney disease, stage 3 unspecified: Secondary | ICD-10-CM | POA: Diagnosis present

## 2020-06-16 DIAGNOSIS — Z95810 Presence of automatic (implantable) cardiac defibrillator: Secondary | ICD-10-CM | POA: Diagnosis not present

## 2020-06-16 DIAGNOSIS — I10 Essential (primary) hypertension: Secondary | ICD-10-CM | POA: Diagnosis not present

## 2020-06-16 DIAGNOSIS — I4819 Other persistent atrial fibrillation: Secondary | ICD-10-CM | POA: Diagnosis present

## 2020-06-16 DIAGNOSIS — N1832 Chronic kidney disease, stage 3b: Secondary | ICD-10-CM | POA: Diagnosis present

## 2020-06-16 DIAGNOSIS — E7800 Pure hypercholesterolemia, unspecified: Secondary | ICD-10-CM | POA: Diagnosis not present

## 2020-06-16 DIAGNOSIS — I2583 Coronary atherosclerosis due to lipid rich plaque: Secondary | ICD-10-CM | POA: Diagnosis not present

## 2020-06-16 LAB — HEPATIC FUNCTION PANEL
ALT: 14 U/L (ref 0–44)
AST: 26 U/L (ref 15–41)
Albumin: 3.1 g/dL — ABNORMAL LOW (ref 3.5–5.0)
Alkaline Phosphatase: 105 U/L (ref 38–126)
Bilirubin, Direct: 0.2 mg/dL (ref 0.0–0.2)
Indirect Bilirubin: 0.8 mg/dL (ref 0.3–0.9)
Total Bilirubin: 1 mg/dL (ref 0.3–1.2)
Total Protein: 6.3 g/dL — ABNORMAL LOW (ref 6.5–8.1)

## 2020-06-16 LAB — HEMOGLOBIN A1C
Hgb A1c MFr Bld: 6.4 % — ABNORMAL HIGH (ref 4.8–5.6)
Mean Plasma Glucose: 136.98 mg/dL

## 2020-06-16 LAB — CBC WITH DIFFERENTIAL/PLATELET
Abs Immature Granulocytes: 0.15 10*3/uL — ABNORMAL HIGH (ref 0.00–0.07)
Basophils Absolute: 0.2 10*3/uL — ABNORMAL HIGH (ref 0.0–0.1)
Basophils Relative: 1 %
Eosinophils Absolute: 0.1 10*3/uL (ref 0.0–0.5)
Eosinophils Relative: 1 %
HCT: 37.6 % — ABNORMAL LOW (ref 39.0–52.0)
Hemoglobin: 11.8 g/dL — ABNORMAL LOW (ref 13.0–17.0)
Immature Granulocytes: 1 %
Lymphocytes Relative: 15 %
Lymphs Abs: 3.7 10*3/uL (ref 0.7–4.0)
MCH: 27.6 pg (ref 26.0–34.0)
MCHC: 31.4 g/dL (ref 30.0–36.0)
MCV: 88.1 fL (ref 80.0–100.0)
Monocytes Absolute: 2.2 10*3/uL — ABNORMAL HIGH (ref 0.1–1.0)
Monocytes Relative: 9 %
Neutro Abs: 18.2 10*3/uL — ABNORMAL HIGH (ref 1.7–7.7)
Neutrophils Relative %: 73 %
Platelets: 443 10*3/uL — ABNORMAL HIGH (ref 150–400)
RBC: 4.27 MIL/uL (ref 4.22–5.81)
RDW: 17.6 % — ABNORMAL HIGH (ref 11.5–15.5)
WBC: 24.6 10*3/uL — ABNORMAL HIGH (ref 4.0–10.5)
nRBC: 0 % (ref 0.0–0.2)

## 2020-06-16 LAB — BASIC METABOLIC PANEL
Anion gap: 17 — ABNORMAL HIGH (ref 5–15)
Anion gap: 9 (ref 5–15)
BUN: 14 mg/dL (ref 8–23)
BUN: 17 mg/dL (ref 8–23)
CO2: 23 mmol/L (ref 22–32)
CO2: 26 mmol/L (ref 22–32)
Calcium: 9.1 mg/dL (ref 8.9–10.3)
Calcium: 9.2 mg/dL (ref 8.9–10.3)
Chloride: 100 mmol/L (ref 98–111)
Chloride: 101 mmol/L (ref 98–111)
Creatinine, Ser: 1.72 mg/dL — ABNORMAL HIGH (ref 0.61–1.24)
Creatinine, Ser: 1.89 mg/dL — ABNORMAL HIGH (ref 0.61–1.24)
GFR, Estimated: 42 mL/min — ABNORMAL LOW (ref >60)
GFR, Estimated: 37 mL/min — ABNORMAL LOW (ref >60)
Glucose, Bld: 130 mg/dL — ABNORMAL HIGH (ref 70–99)
Glucose, Bld: 164 mg/dL — ABNORMAL HIGH (ref 70–99)
Potassium: 2.1 mmol/L — CL (ref 3.5–5.1)
Potassium: 3.5 mmol/L (ref 3.5–5.1)
Sodium: 140 mmol/L (ref 135–145)
Sodium: 136 mmol/L (ref 135–145)

## 2020-06-16 LAB — TROPONIN I (HIGH SENSITIVITY)
Troponin I (High Sensitivity): 1214 ng/L (ref <18)
Troponin I (High Sensitivity): 1810 ng/L (ref <18)

## 2020-06-16 LAB — RESP PANEL BY RT-PCR (FLU A&B, COVID) ARPGX2
Influenza A by PCR: NEGATIVE
Influenza B by PCR: NEGATIVE
SARS Coronavirus 2 by RT PCR: NEGATIVE

## 2020-06-16 LAB — GLUCOSE, CAPILLARY
Glucose-Capillary: 145 mg/dL — ABNORMAL HIGH (ref 70–99)
Glucose-Capillary: 155 mg/dL — ABNORMAL HIGH (ref 70–99)

## 2020-06-16 LAB — MAGNESIUM: Magnesium: 1.7 mg/dL (ref 1.7–2.4)

## 2020-06-16 LAB — TSH: TSH: 15.865 u[IU]/mL — ABNORMAL HIGH (ref 0.350–4.500)

## 2020-06-16 MED ORDER — PANTOPRAZOLE SODIUM 40 MG PO TBEC
40.0000 mg | DELAYED_RELEASE_TABLET | Freq: Every day | ORAL | Status: DC
  Administered 2020-06-16 – 2020-06-18 (×3): 40 mg via ORAL
  Filled 2020-06-16 (×3): qty 1

## 2020-06-16 MED ORDER — LORAZEPAM 2 MG/ML IJ SOLN: 1.0000 mg | Freq: Once | INTRAMUSCULAR | Status: DC

## 2020-06-16 MED ORDER — MAGNESIUM SULFATE 2 GM/50ML IV SOLN
2.0000 g | Freq: Once | INTRAVENOUS | Status: AC
  Administered 2020-06-16: 2 g via INTRAVENOUS

## 2020-06-16 MED ORDER — ONDANSETRON HCL 4 MG/2ML IJ SOLN: 4.0000 mg | Freq: Four times a day (QID) | INTRAMUSCULAR | Status: DC | PRN

## 2020-06-16 MED ORDER — SODIUM CHLORIDE 0.9 % IV SOLN: 250.0000 mL | INTRAVENOUS | Status: DC | PRN

## 2020-06-16 MED ORDER — AMIODARONE IV BOLUS ONLY 150 MG/100ML
150.0000 mg | Freq: Once | INTRAVENOUS | Status: AC
  Administered 2020-06-16: 150 mg via INTRAVENOUS

## 2020-06-16 MED ORDER — HYDROMORPHONE HCL 1 MG/ML IJ SOLN
INTRAMUSCULAR | Status: AC
  Administered 2020-06-16: 1 mg via INTRAVENOUS
  Filled 2020-06-16: qty 1

## 2020-06-16 MED ORDER — ACETAMINOPHEN 325 MG PO TABS
650.0000 mg | ORAL_TABLET | ORAL | Status: DC | PRN
  Administered 2020-06-17 – 2020-06-18 (×2): 650 mg via ORAL
  Filled 2020-06-16 (×2): qty 2

## 2020-06-16 MED ORDER — METOPROLOL TARTRATE 5 MG/5ML IV SOLN: 5.0000 mg | Freq: Once | INTRAVENOUS | Status: AC

## 2020-06-16 MED ORDER — POTASSIUM CHLORIDE CRYS ER 20 MEQ PO TBCR
40.0000 meq | EXTENDED_RELEASE_TABLET | Freq: Once | ORAL | Status: AC
  Administered 2020-06-16: 40 meq via ORAL

## 2020-06-16 MED ORDER — APIXABAN 5 MG PO TABS
5.0000 mg | ORAL_TABLET | Freq: Two times a day (BID) | ORAL | Status: DC
  Administered 2020-06-16 – 2020-06-19 (×6): 5 mg via ORAL
  Filled 2020-06-16 (×6): qty 1

## 2020-06-16 MED ORDER — AMIODARONE HCL IN DEXTROSE 360-4.14 MG/200ML-% IV SOLN
30.0000 mg/h | INTRAVENOUS | Status: DC
  Administered 2020-06-16 – 2020-06-17 (×2): 30 mg/h via INTRAVENOUS
  Filled 2020-06-16 (×2): qty 200

## 2020-06-16 MED ORDER — ATORVASTATIN CALCIUM 40 MG PO TABS
40.0000 mg | ORAL_TABLET | Freq: Every day | ORAL | Status: DC
  Administered 2020-06-16 – 2020-06-18 (×3): 40 mg via ORAL
  Filled 2020-06-16 (×3): qty 1

## 2020-06-16 MED ORDER — ASPIRIN EC 81 MG PO TBEC
81.0000 mg | DELAYED_RELEASE_TABLET | Freq: Every day | ORAL | Status: DC
  Administered 2020-06-17 – 2020-06-19 (×3): 81 mg via ORAL
  Filled 2020-06-16 (×3): qty 1

## 2020-06-16 MED ORDER — AMIODARONE HCL IN DEXTROSE 360-4.14 MG/200ML-% IV SOLN
60.0000 mg/h | INTRAVENOUS | Status: AC
  Administered 2020-06-16: 60 mg/h via INTRAVENOUS

## 2020-06-16 MED ORDER — LORAZEPAM 2 MG/ML IJ SOLN
INTRAMUSCULAR | Status: AC
  Administered 2020-06-16: 1 mg via INTRAVENOUS
  Filled 2020-06-16: qty 1

## 2020-06-16 MED ORDER — LORAZEPAM 2 MG/ML IJ SOLN: 1.0000 mg | Freq: Once | INTRAMUSCULAR | Status: AC

## 2020-06-16 MED ORDER — HYDROMORPHONE HCL 1 MG/ML IJ SOLN: 1.0000 mg | Freq: Once | INTRAMUSCULAR | Status: DC

## 2020-06-16 MED ORDER — POTASSIUM CHLORIDE 10 MEQ/100ML IV SOLN
10.0000 meq | INTRAVENOUS | Status: AC
  Administered 2020-06-16 (×6): 10 meq via INTRAVENOUS
  Filled 2020-06-16 (×5): qty 100

## 2020-06-16 MED ORDER — SODIUM CHLORIDE 0.9% FLUSH: 3.0000 mL | INTRAVENOUS | Status: DC | PRN

## 2020-06-16 MED ORDER — POTASSIUM CHLORIDE CRYS ER 20 MEQ PO TBCR
40.0000 meq | EXTENDED_RELEASE_TABLET | Freq: Every day | ORAL | Status: DC
  Administered 2020-06-17: 40 meq via ORAL
  Filled 2020-06-16 (×2): qty 2

## 2020-06-16 MED ORDER — METOPROLOL TARTRATE 5 MG/5ML IV SOLN
INTRAVENOUS | Status: AC
  Administered 2020-06-16: 5 mg via INTRAVENOUS
  Filled 2020-06-16: qty 5

## 2020-06-16 MED ORDER — NITROGLYCERIN 0.4 MG SL SUBL: 0.4000 mg | SUBLINGUAL_TABLET | SUBLINGUAL | Status: DC | PRN

## 2020-06-16 MED ORDER — SODIUM CHLORIDE 0.9% FLUSH
3.0000 mL | Freq: Two times a day (BID) | INTRAVENOUS | Status: DC
  Administered 2020-06-16 – 2020-06-19 (×5): 3 mL via INTRAVENOUS

## 2020-06-16 MED ORDER — INSULIN ASPART 100 UNIT/ML ~~LOC~~ SOLN
0.0000 [IU] | Freq: Three times a day (TID) | SUBCUTANEOUS | Status: DC
  Administered 2020-06-16 – 2020-06-17 (×3): 2 [IU] via SUBCUTANEOUS
  Administered 2020-06-18: 3 [IU] via SUBCUTANEOUS
  Administered 2020-06-18 (×2): 2 [IU] via SUBCUTANEOUS
  Administered 2020-06-19: 3 [IU] via SUBCUTANEOUS
  Administered 2020-06-19: 2 [IU] via SUBCUTANEOUS

## 2020-06-16 MED ORDER — HYDROMORPHONE HCL 1 MG/ML IJ SOLN: 1.0000 mg | Freq: Once | INTRAMUSCULAR | Status: AC

## 2020-06-16 NOTE — ED Notes (Signed)
MD Zackowski at bedside. 

## 2020-06-16 NOTE — Progress Notes (Signed)
  Echocardiogram 2D Echocardiogram has been performed.  Charles Mcdonald 06/16/2020, 5:57 PM

## 2020-06-16 NOTE — H&P (Signed)
Cardiology Admission History and Physical:   Patient ID: Charles Mcdonald MRN: [id number]; DOB: 04/29/1945   Admission date: 06/16/2020  PCP:  Charlane Ferretti   [place]  Cardiologist:  No primary care provider on file. new ([facility] -Aggie Cosier, MD) Advanced Practice Provider:  No care team member to display Electrophysiologist:  None        Chief Complaint:  ICD shocks  Patient Profile:   Charles Mcdonald is a 75 y.o. male with longstanding history of ischemic cardiomyopathy, chronic combined systolic and diastolic heart failure with severely depressed left ventricular systolic function, extensive CAD, reportedly not amenable to surgical revascularization, status post Medtronic ICD, presenting after multiple defibrillator shocks  History of Present Illness:   Mr. [name] was in his usual state of health this morning until approximately 11:30 AM.  He developed diaphoresis, but did not have chest pain, dyspnea or palpitations or syncope.  Just a few minutes later he received the first defibrillator shock.  He did not lose consciousness.  Over the next 2 hours while awaiting medical care and during his initial evaluation in the emergency room he received a total of 103 ICD shocks.  Thankfully he appeared to tolerate the triggering arrhythmia well and after placement of a magnet over his ICD, the shocks ceased.  He continued to have recurrent episodes of monomorphic ventricular tachycardia at about 224 bpm, often triggered by brief bursts of VT with similar QRS morphology.  There was no evidence of "long short" sequences or true torsades.  The arrhythmia began to subside after administration of intravenous amiodarone and intravenous metoprolol.  Subsequently, labs came back and the patient was found to be severely hypokalemic with a potassium of 2.1.  Although he has a lot of potassium supplement stores at home, he tells [name] that he was instructed to  discontinue his potassium several months ago.  He denies recent problems with worsening shortness of breath and has not had angina pectoris.  He denies fever, chills but he has had a cough for about a couple of weeks.  Based on the electronic medical record he has not being evaluated by his EP cardiologist, Dr. Uvaldo Rising since July 2020 when he had a last outpatient visit.  However, based on device interrogation, his defibrillator was interrogated during a hospitalization 02/04/2020-02/29/2020.    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  His device had shocked him several times just before the device was interrogated on February 08, 2020.  Review of the discharge summary from that admission reports episodes of ventricular tachycardia ("self resolving", I suspect resolved with ICD shocks) as well as synchronized cardioversion for atrial fibrillation with RVR.  He was treated with intravenous amiodarone and intravenous pressors (at times on 3 different pressors) for shock.  He had endotracheal intubation mechanical ventilation for a long period of time.  He required temporary peg tube feeding.  Echocardiography during that admission showed EF 25-30%.  He was discharged to [facility] on December 08.  He underwent repeat surgical procedures on January 4 for his groin wounds. He received 6 defibrillator shocks in early January.  I am not sure that these shocks were acknowledged by the patient as the device was not interrogated.  It is possible that he was under anesthesia or sedated when they occurred.  He did develop heart failure exacerbation during that hospitalization and had  a cardiology consultation by Dr. Doylene Canard on January 19.  He incidentally had COVID-19 infection during that hospital stay.  He has since returned home. He still has a PEG and colostomy LLQ.  He has relocated to [facility]  and has not been able to use his remote transmitter, since he tells me that this requires a landline phone port.   He has a longstanding history of ischemic heart disease.  He had an anterior MI in 2002 treated with a stent to the proximal LAD at which time his EF was 37%.  Cardiac catheterization in 2004 showed occlusion of the proximal LAD and mid RCA and mid left circumflex artery with collateral formation.  He was "turned down for surgical revascularization" and "refused transplant".  There was no evidence of viability (not sure what type of study was used).   He had another non-ST segment relation myocardial infarction 2017, coronary anatomy was not much changed and an unsuccessful attempt was made at a PCI of LAD.   Subsequent echocardiograms and nuclear stress test have shown varying degrees of left ventricular systolic dysfunction, usually with EF around 25-30%.   Initial ICD was implanted in 2006.  His high-voltage lead is "on advisory" (details unknown), and he had generator change out performed in 2013 in April 2020.  At that last intervention, an attempt at upgrading his device to a biventricular pacemaker defibrillator was unsuccessful since the passive left ventricular lead repeatedly dislodged.  And was made to go ahead with an epicardial lead or an active fixation left ventricular lead at a future date.  His current device is a [employer] systems engineer CRT-D device, but the LV lead port is pinned and the device is programmed as a conventional dual-chamber defibrillator.  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  They also report that he has sick sinus syndrome.  However, interrogation of his device today (lower rate programmed 60 bpm) shows 86% atrial sensed, ventricular sensed rhythm and only 14% atrial pacing.  Only 0.2% ventricular pacing.  Activity level has been very poor throughout the months of November, December and January when he was hospitalized  with very low level of activity into 0.1 hours a day) since the beginning of February when he returned home.  There has been no significant atrial fibrillation.  Thoracic impedance has been out of range ever since he was initially hospitalized in November.  It has never returned to baseline.  Over 100 episodes of ventricular tachycardia are recorded in the 2 hours since 11:30 AM today: A total of 55 of 61 episodes were pace-terminated and another 73 of 74 episodes were shock terminated with a total of 103 shocks delivered.  Multiple episodes of ventricular tachycardia, monomorphic, over 200 bpm were treated as being in the VF zone, usually with successful defibrillation with a single shock.  ATP never intervened since the arrhythmia was too fast and ATP is turned off during charge in the VF zone.  Additional medical problems include diabetes mellitus, hyperlipidemia, hypertension and chronic kidney disease.  His baseline creatinine seems to be around 1.7-2.0.   Past Medical History:  Diagnosis Date  . Acute on chronic respiratory failure with hypoxia ([place])   . Atrial fibrillation, chronic ([place])   . Chronic HFrEF (heart failure with reduced ejection fraction) ([place] [place])   . Chronic kidney disease, stage III (moderate) ([facility])     Past Surgical History:  Procedure Laterality Date  . IR THORACENTESIS ASP PLEURAL SPACE W/IMG GUIDE  04/17/2020  . SKIN SPLIT GRAFT Bilateral 03/27/2020   Procedure: SKIN GRAFT SPLIT THICKNESS;  Surgeon: Cindra Presume, MD;  Location: [facility];  Service: Plastics;  Laterality: Bilateral;  . WOUND EXPLORATION Bilateral 03/27/2020   Procedure: Exploration bilateral groin wounds with partial closure;  Surgeon: Cindra Presume, MD;  Location: [facility];  Service: Plastics;  Laterality: Bilateral;     Medications Prior to Admission: Prior to Admission medications   Medication Sig Start Date End Date Taking? Authorizing Provider  allopurinol (ZYLOPRIM) 100 MG tablet allopurinol 100 mg  tablet    [provider]  amoxicillin-clavulanate (AUGMENTIN) 875-125 MG tablet Take 1 tablet by mouth 2 (two) times daily. 04/21/20   [provider]  apixaban (ELIQUIS) 5 MG TABS tablet Eliquis 5 mg tablet 12/01/19   [provider]  atenolol (TENORMIN) 25 MG tablet Take 25 mg by mouth daily. 04/21/20   [provider]  atorvastatin (LIPITOR) 40 MG tablet atorvastatin 40 mg tablet 11/11/15   [provider]  colchicine 0.6 MG tablet colchicine 0.6 mg tablet    [provider]  dapagliflozin propanediol (FARXIGA) 10 MG TABS tablet Farxiga 10 mg tablet    [provider]  furosemide (LASIX) 40 MG tablet furosemide 40 mg tablet 09/24/19   [provider]  glucose blood test strip Accu-Chek Aviva Plus test strips    [provider]  insulin glargine (LANTUS) 100 UNIT/ML injection Inject into the skin. 02/29/20 03/01/21  [provider]  losartan (COZAAR) 50 MG tablet losartan 50 mg tablet 08/31/19   [provider]  metoprolol succinate (TOPROL-XL) 100 MG 24 hr tablet metoprolol succinate ER 100 mg tablet,extended release 24 hr 08/23/19   [provider]  midodrine (PROAMATINE) 10 MG tablet Take 10 mg by mouth 3 (three) times daily. 04/21/20   [provider]  pantoprazole (PROTONIX) 40 MG tablet Take 40 mg by mouth daily. 04/21/20   [provider]  potassium chloride SA (KLOR-CON) 20 MEQ tablet potassium chloride ER 20 mEq tablet,extended release(part/cryst) 10/13/19   [provider]     Allergies:   No Known Allergies  Social History:   Social History   Socioeconomic History  . Marital status: Married    Spouse name: Not on file  . Number of children: Not on file  . Years of education: Not on file  . Highest education level: Not on file  Occupational History  . Not on file  Tobacco Use  . Smoking status: Former Research scientist (life sciences)  . Smokeless tobacco: Never Used  Substance  and Sexual Activity  . Alcohol use: Not on file  . Drug use: Not on file  . Sexual activity: Not on file  Other Topics Concern  . Not on file  Social History Narrative  . Not on file   Social Determinants of Health   Financial Resource Strain: Not on file  Food Insecurity: Not on file  Transportation Needs: Not on file  Physical Activity: Not on file  Stress: Not on file  Social Connections: Not on file  Intimate Partner Violence: Not on file    Family History:   The patient's  mother and father both had "heart disease".  His sister has kidney failure and diabetes mellitus.  ROS:  Please see the history of present illness.  All other ROS reviewed and negative.     Physical Exam/Data:   Vitals:   06/16/20 1330 06/16/20 1345 06/16/20 1347 06/16/20 1400  BP:  113/78  104/67  Pulse: (!) 110 (!) 109  (!) 102  Resp: (!) 30 (!) 34  (!) 34  Temp:   98.4 F (36.9 C)   TempSrc:   Oral   SpO2: 92% 90%  (!) 86%  Weight:      Height:        Intake/Output Summary (Last 24 hours) at 06/16/2020 1419 Last data filed at 06/16/2020 1311 Gross per 24 hour  Intake 100 ml  Output --  Net 100 ml   Last 3 Weights 06/16/2020 04/25/2020  Weight (lbs) 185 lb 180 lb  Weight (kg) 83.915 kg 81.647 kg     Body mass index is 25.09 kg/m.  General:  Well nourished, well developed, in no acute distress,, but somewhat shaken by his multiple shocks HEENT: normal Lymph: no adenopathy Neck: 6-8 cm JVD Endocrine:  No thryomegaly Vascular: No carotid bruits; FA pulses 2+ bilaterally without bruits  Cardiac:  normal S1, split S2; RRR; no murmur.  Well-healed left subclavian ICD site. Lungs:  clear to auscultation bilaterally, no wheezing, rhonchi or rales  Abd: soft, nontender, no hepatomegaly . PEG tube in place.  He has a LLQ colostomy, appears healthy Ext: Trivial symmetrical pretibial edema Musculoskeletal:  No deformities, BUE and BLE strength normal and equal Skin: warm and dry  Neuro:  CNs  2-12 intact, no focal abnormalities noted Psych:  Normal affect    EKG:  The ECG that was done earlier today shows sinus tachycardia interspersed with numerous brief bursts of ventricular couplets or nonsustained VT with a distinct morphology; the QRS morphology during sinus rhythm is an atypical right bundle branch block with ST segment elevation seen throughout the precordial leads (unchanged from a tracing from January).  QTc interval 589 ms, but the QRS is over 170 ms. A second tracing shows sustained monomorphic ventricular tachycardia with extreme left axis deviation and positive concordance in the precordial leads and a very broad QRS well over 160 ms.   Relevant CV Studies: Echocardiogram 04/12/2020 1. Left ventricular ejection fraction, by estimation, is 20 to 25%. The  left ventricle has severely decreased function. The left ventricle  demonstrates global hypokinesis. The left ventricular internal cavity size  was mildly dilated. Left ventricular  diastolic parameters are consistent with Grade I diastolic dysfunction  (impaired relaxation).  2. Right ventricular systolic function is moderately reduced. The right  ventricular size is mildly enlarged.  3. Left atrial size was moderately dilated.  4. Pacer wire seen. Right atrial size was mildly dilated.  5. The mitral valve is degenerative. Mild mitral valve regurgitation.  6. The aortic valve is tricuspid. There is mild calcification of the  aortic valve. There is mild thickening of the aortic valve. Aortic valve  regurgitation is not visualized. Mild aortic valve sclerosis is present,  with no evidence of aortic valve  stenosis.  7. There is mild (Grade II) atheroma plaque involving the aortic root and  ascending aorta.  8. The inferior vena cava is normal in size with <50% respiratory  variability, suggesting right atrial pressure of 8 mmHg.   Laboratory Data:  High Sensitivity Troponin:  No results for input(s):  TROPONINIHS in the last 720 hours.    Chemistry Recent Labs  Lab 06/16/20 1241  NA 140  K 2.1*  CL 100  CO2 23  GLUCOSE 130*  BUN 14  CREATININE 1.72*  CALCIUM 9.1  GFRNONAA 42*  ANIONGAP 17*    No results for input(s): PROT, ALBUMIN, AST, ALT, ALKPHOS, BILITOT  in the last 168 hours. Hematology Recent Labs  Lab 06/16/20 1241  WBC 24.6*  RBC 4.27  HGB 11.8*  HCT 37.6*  MCV 88.1  MCH 27.6  MCHC 31.4  RDW 17.6*  PLT 443*   BNPNo results for input(s): BNP, PROBNP in the last 168 hours.  DDimer No results for input(s): DDIMER in the last 168 hours.   Radiology/Studies:  DG Chest Port 1 View  Result Date: 06/16/2020 CLINICAL DATA:  Tachycardia.  ICD discharging. EXAM: PORTABLE CHEST 1 VIEW COMPARISON:  04/17/20. FINDINGS: Left chest wall ICD is noted with leads in the right atrial appendage and right ventricle. Stable mild cardiac enlargement. Interstitial and airspace opacities within the left midlung and left base is identified. No signs of pleural effusion. IMPRESSION: 1. Asymmetric interstitial and airspace opacities in the left midlung and left base concerning for pneumonia versus asymmetric edema 2. Mild cardiac enlargement. Electronically Signed   By: Kerby Moors M.D.   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  VT is monomorphic, suggesting scar as the substrate, but the episodes are triggered by brief ventricular couplets.  Doing much better on amiodarone, suspect we will continue this as a chronic medication.  Reviewing case with Dr. Jolyn Nap, EP. 2. CHF: Seems to be in a protracted episode of mild systolic heart failure throughout the last several months.  Hold off on diuretics for the time being since his breathing is fairly comfortable, until we correct his hypokalemia.  Severe LV dysfunction.  Note attempts at CRT that were unsuccessful.  She was  with acute heart failure exacerbation after over 100 ICD shocks.  Home heart failure regimen includes furosemide, losartan and metoprolol, but also listed are atenolol and midodrine.  Wilder Glade still on his list, but he recently had Fournier's gangrene.  Will need to clarify his list. 3. CAD: Longstanding and complicated history.  Sounds like he does not have any great options for revascularization.  Expect that the troponin will be elevated after so many defibrillator shocks.  No immediate plan for coronary angiography. 4. History of atrial fibrillation: On apixaban.  Reports no recent bleeding complications.  Hemoglobin 11.8. 5. ICD: Need to clarify why his high-voltage lead is "under advisory" do not have those details at this time.  Note attempts at CRT upgrade unsuccessful due to repeated dislodgment of the coronary sinus passive lead.  Consideration has been given to use of an epicardial lead or an active fixation coronary sinus lead.  He has a Medtronic CRT-D device implanted with the LV port pinned.  RA and RV lead parameters all appear to be normal.  After the numerous ICD shocks that he received his current device longevity is only expected to be 15 months. 6. Hypokalemia: Aggressive replacement with oral and intravenous supplements as planned.  Indeed, his list of home medications does not include a potassium supplement or spironolactone. 7. CKD 3B: Baseline creatinine seems to be 1.7-2.0, he is in that range today. 8. 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  CRITICAL CARE Performed by: Dani Gobble Nyeli Holtmeyer   Total critical care time: 75 minutes  Critical care time was exclusive of separately billable procedures and treating other patients.  Critical care was necessary to treat or prevent imminent or life-threatening deterioration.  Critical care was  time spent personally by me on the  following activities: development of treatment plan with patient and/or surrogate as well as nursing, discussions with consultants, evaluation of patient's response to treatment, examination of patient, obtaining history from patient or surrogate, ordering and performing treatments and interventions, ordering and review of laboratory studies, ordering and review of radiographic studies, pulse oximetry and re-evaluation of patient's condition.   Risk Assessment/Risk Scores:        New York Heart Association (NYHA) Functional Class NYHA Class III  CHA2DS2-VASc Score = 5  This indicates a 7.2% annual risk of stroke. The patient's score is based upon: CHF History: Yes HTN History: Yes Diabetes History: Yes Stroke History: No Vascular Disease History: Yes Age Score: 1 Gender Score: 0      Severity of Illness: The appropriate patient status for this patient is INPATIENT. Inpatient status is judged to be reasonable and necessary in order to provide the required intensity of service to ensure the patient's safety. The patient's presenting symptoms, physical exam findings, and initial radiographic and laboratory data in the context of their chronic comorbidities is felt to place them at high risk for further clinical deterioration. Furthermore, it is not anticipated that the patient will be medically stable for discharge from the hospital within 2 midnights of admission. The following factors support the patient status of inpatient.   " The patient's presenting symptoms include over 100 defibrillator shocks, shortness of breath " The worrisome physical exam findings include hypervolemia. " The initial radiographic and laboratory data are worrisome because of severe hypokalemia. " The chronic co-morbidities include CHF, diabetes mellitus, chronic kidney disease, CAD, ventricular tachycardia.   * I certify that at the point of admission it is my clinical judgment that the patient will require  inpatient hospital care spanning beyond 2 midnights from the point of admission due to high intensity of service, high risk for further deterioration and high frequency of surveillance required.*    For questions or updates, please contact [place] Please consult [email or web address] for contact info under     Signed, Sanda Klein, MD  06/16/2020 2:19 PM

## 2020-06-16 NOTE — ED Provider Notes (Signed)
[facility] EMERGENCY DEPARTMENT Provider Note   CSN: [id number] Arrival date & time: 06/16/20  1221     History Chief Complaint  Patient presents with  . Tachycardia    Charles Mcdonald is a 75 y.o. male.  Patient states his defibrillator started firing numerous times starting about an hour ago.  Witnessed at fire here several times.  Appeared to be responding to ventricular tachycardia with a heart rate around 200.  But patient after shocking would go back into more of a sinus tach.  Patient states he felt fine yesterday other than feeling somewhat fatigued.  But was not feeling ill.  Patient has no chest pain other than when he shocked.  Patient states he has had a defibrillator since 2002.  He is normally followed at the [facility] area.  He is recently moved up here.  Is not connected with cardiology here.        Past Medical History:  Diagnosis Date  . Acute on chronic respiratory failure with hypoxia ([place])   . Atrial fibrillation, chronic ([place])   . Chronic HFrEF (heart failure with reduced ejection fraction) ([place])   . Chronic kidney disease, stage III (moderate) (HCC)     Patient Active Problem List   Diagnosis Date Noted  . VT (ventricular tachycardia) ([place]) 06/16/2020  . Ventricular tachycardia ([place]) 06/16/2020  . Acute on chronic respiratory failure with hypoxia ([place])   . Chronic HFrEF (heart failure with reduced ejection fraction) ([place])   . Chronic kidney disease, stage III (moderate) (HCC)   . Atrial fibrillation, chronic ([place])     Past Surgical History:  Procedure Laterality Date  . IR THORACENTESIS ASP PLEURAL SPACE W/IMG GUIDE  04/17/2020  . SKIN SPLIT GRAFT Bilateral 03/27/2020   Procedure: SKIN GRAFT SPLIT THICKNESS;  Surgeon: Cindra Presume, MD;  Location: [place];  Service: Plastics;  Laterality: Bilateral;  . WOUND EXPLORATION Bilateral 03/27/2020   Procedure: Exploration bilateral groin wounds with partial closure;  Surgeon: Cindra Presume, MD;  Location: [place];  Service: Plastics;  Laterality: Bilateral;       History reviewed. No pertinent family history.  Social History   Tobacco Use  . Smoking status: Former Research scientist (life sciences)  . Smokeless tobacco: Never Used    Home Medications Prior to Admission medications   Medication Sig Start Date End Date Taking? Authorizing Provider  allopurinol (ZYLOPRIM) 100 MG tablet Take 100 mg by mouth daily.   Yes [provider]  apixaban (ELIQUIS) 5 MG TABS tablet Take 5 mg by mouth every 12 (twelve) hours. 12/01/19  Yes [provider]  ascorbic acid (VITAMIN C) 500 MG tablet Take 500 mg by mouth daily.   Yes [provider]  aspirin EC 81 MG tablet Take 81 mg by mouth as directed. Swallow whole.   Yes [provider]  atorvastatin (LIPITOR) 40 MG tablet Take 40 mg by mouth at bedtime. 11/11/15  Yes [provider]  colchicine 0.6 MG tablet Take 0.6 mg by mouth See admin instructions. Take 0.6 mg by mouth as directed for gout flares   Yes [provider]  furosemide (LASIX) 40 MG tablet Take 40 mg by mouth 2 (two) times daily. 09/24/19  Yes [provider]  insulin lispro (HUMALOG) 100 UNIT/ML KwikPen Inject 5 Units into the skin 2 (two) times daily before a meal.   Yes [provider]  LANTUS SOLOSTAR 100 UNIT/ML Solostar Pen Inject 5-10 Units into the skin See admin  instructions. Inject 10 units into the skin before breakfast and 5 units at bedtime   Yes [provider]  metoprolol succinate (TOPROL-XL) 25 MG 24 hr tablet Take 25 mg by mouth at bedtime.   Yes [provider]  midodrine (PROAMATINE) 10 MG tablet Take 10 mg by mouth every 8 (eight) hours. 04/21/20  Yes [provider]  pantoprazole (PROTONIX) 40 MG tablet Take 40 mg by mouth at bedtime. 04/21/20  Yes [provider]  dapagliflozin propanediol (FARXIGA) 10 MG TABS tablet Take 10 mg by mouth daily.    [provider]  glucose blood test strip Accu-Chek Aviva Plus test strips    [provider]  losartan (COZAAR) 50 MG tablet Take 50 mg by mouth daily. 08/31/19   [provider]    Allergies    Patient has no known allergies.  Review of Systems   Review of Systems  Constitutional: Positive for fatigue. Negative for chills and fever.  HENT: Negative for rhinorrhea and sore throat.   Eyes: Negative for visual disturbance.  Respiratory: Negative for cough and shortness of breath.   Cardiovascular: Positive for palpitations. Negative for chest pain and leg swelling.  Gastrointestinal: Negative for abdominal pain, diarrhea, nausea and vomiting.  Genitourinary: Negative for dysuria.  Musculoskeletal: Negative for back pain and neck pain.  Skin: Negative for rash.  Neurological: Negative for dizziness, light-headedness and headaches.  Hematological: Does not bruise/bleed easily.  Psychiatric/Behavioral: Negative for confusion.    Physical Exam Updated Vital Signs BP 105/81   Pulse 86   Temp 98.4 F (36.9 C) (Oral)   Resp (!) 28   Ht 1.829 m (6')   Wt 83.9 kg   SpO2 92%   BMI 25.09 kg/m   Physical Exam Vitals and nursing note reviewed.  Constitutional:      Appearance: Normal appearance. He is well-developed.  HENT:     Head: Normocephalic and atraumatic.  Eyes:     Extraocular Movements: Extraocular movements intact.     Conjunctiva/sclera: Conjunctivae normal.     Pupils: Pupils are equal, round, and reactive to light.  Cardiovascular:     Rate and Rhythm: Tachycardia present. Rhythm irregular.     Heart sounds: No murmur heard.   Pulmonary:     Effort: Pulmonary effort is normal. No respiratory distress.     Breath sounds: Normal breath sounds.  Abdominal:     Palpations: Abdomen is soft.     Tenderness: There is no abdominal tenderness.  Musculoskeletal:        General: No swelling.     Cervical back: Neck supple.  Skin:    General: Skin is warm and dry.      Capillary Refill: Capillary refill takes less than 2 seconds.  Neurological:     General: No focal deficit present.     Mental Status: He is alert and oriented to person, place, and time.     Cranial Nerves: No cranial nerve deficit.     Sensory: No sensory deficit.     ED Results / Procedures / Treatments   Labs (all labs ordered are listed, but only abnormal results are displayed) Labs Reviewed  CBC WITH DIFFERENTIAL/PLATELET - Abnormal; Notable for the following components:      Result Value   WBC 24.6 (*)    Hemoglobin 11.8 (*)    HCT 37.6 (*)    RDW 17.6 (*)    Platelets 443 (*)    Neutro Abs 18.2 (*)  Monocytes Absolute 2.2 (*)    Basophils Absolute 0.2 (*)    Abs Immature Granulocytes 0.15 (*)    All other components within normal limits  BASIC METABOLIC PANEL - Abnormal; Notable for the following components:   Potassium 2.1 (*)    Glucose, Bld 130 (*)    Creatinine, Ser 1.72 (*)    GFR, Estimated 42 (*)    Anion gap 17 (*)    All other components within normal limits  HEMOGLOBIN A1C - Abnormal; Notable for the following components:   Hgb A1c MFr Bld 6.4 (*)    All other components within normal limits  GLUCOSE, CAPILLARY - Abnormal; Notable for the following components:   Glucose-Capillary 145 (*)    All other components within normal limits  RESP PANEL BY RT-PCR (FLU A&B, COVID) ARPGX2  MAGNESIUM  BASIC METABOLIC PANEL  TSH  HEPATIC FUNCTION PANEL  BASIC METABOLIC PANEL  LIPID PANEL  TROPONIN I (HIGH SENSITIVITY)  TROPONIN I (HIGH SENSITIVITY)    EKG EKG Interpretation  Date/Time:  Saturday June 16 2020 12:23:50 EDT Ventricular Rate:  139 PR Interval:    QRS Duration: 171 QT Interval:  387 QTC Calculation: 589 R Axis:   60 Text Interpretation: Sinus tachycardia Paired ventricular premature complexes IVCD, consider atypical LBBB Confirmed by Fredia Sorrow [phone]) on 06/16/2020 12:50:42 PM   Radiology DG Chest Port 1 View  Result Date:  06/16/2020 CLINICAL DATA:  Tachycardia.  ICD discharging. EXAM: PORTABLE CHEST 1 VIEW COMPARISON:  04/17/20. FINDINGS: Left chest wall ICD is noted with leads in the right atrial appendage and right ventricle. Stable mild cardiac enlargement. Interstitial and airspace opacities within the left midlung and left base is identified. No signs of pleural effusion. IMPRESSION: 1. Asymmetric interstitial and airspace opacities in the left midlung and left base concerning for pneumonia versus asymmetric edema 2. Mild cardiac enlargement. Electronically Signed   By: Kerby Moors M.D.   On: 06/16/2020 13:35    Procedures Procedures   CRITICAL CARE Performed by: Fredia Sorrow Total critical care time: 45 minutes Critical care time was exclusive of separately billable procedures and treating other patients. Critical care was necessary to treat or prevent imminent or life-threatening deterioration. Critical care was time spent personally by me on the following activities: development of treatment plan with patient and/or surrogate as well as nursing, discussions with consultants, evaluation of patient's response to treatment, examination of patient, obtaining history from patient or surrogate, ordering and performing treatments and interventions, ordering and review of laboratory studies, ordering and review of radiographic studies, pulse oximetry and re-evaluation of patient's condition.     Medications Ordered in ED Medications  amiodarone (NEXTERONE PREMIX) 360-4.14 MG/200ML-% (1.8 mg/mL) IV infusion (60 mg/hr Intravenous New Bag/Given 06/16/20 1253)  amiodarone (NEXTERONE PREMIX) 360-4.14 MG/200ML-% (1.8 mg/mL) IV infusion (has no administration in time range)  potassium chloride 10 mEq in 100 mL IVPB (10 mEq Intravenous New Bag/Given 06/16/20 1646)  aspirin EC tablet 81 mg (has no administration in time range)  nitroGLYCERIN (NITROSTAT) SL tablet 0.4 mg (has no administration in time range)   acetaminophen (TYLENOL) tablet 650 mg (has no administration in time range)  ondansetron (ZOFRAN) injection 4 mg (has no administration in time range)  sodium chloride flush (NS) 0.9 % injection 3 mL (has no administration in time range)  sodium chloride flush (NS) 0.9 % injection 3 mL (has no administration in time range)  0.9 %  sodium chloride infusion (has no administration in time range)  insulin aspart (novoLOG) injection 0-15 Units (2 Units Subcutaneous Given 06/16/20 1645)  HYDROmorphone (DILAUDID) injection 1 mg (1 mg Intravenous Given 06/16/20 1246)  LORazepam (ATIVAN) injection 1 mg (1 mg Intravenous Given 06/16/20 1246)  metoprolol tartrate (LOPRESSOR) injection 5 mg (5 mg Intravenous Given 06/16/20 1247)  amiodarone (NEXTERONE) IV bolus only 150 mg/100 mL (0 mg Intravenous Stopped 06/16/20 1311)  magnesium sulfate IVPB 2 g 50 mL (0 g Intravenous Stopped 06/16/20 1503)  potassium chloride SA (KLOR-CON) CR tablet 40 mEq (40 mEq Oral Given 06/16/20 1339)    ED Course  I have reviewed the triage vital signs and the nursing notes.  Pertinent labs & imaging results that were available during my care of the patient were reviewed by me and considered in my medical decision making (see chart for details).    MDM Rules/Calculators/A&P                          Patient defibrillator Firing frequently.  For apparent V. tach.  Cardiology was consulted and came and saw the patient.  They interrogated defibrillator.  Battery was low.  Patient also had some firing episodes in the month of February.  Patient normally followed in the [facility] area.  Recently moved up here.  Had a pacemaker since 2002 but based on the information that was replaced 2 years ago.  Cardiology recommended amiodarone.  Given 150 mg no great response to the ventricular tachycardia so received another 150 bolus.  He consulted Dr. Caryl Comes.  Patient will be admitted in have evaluated.  Patient tolerated the shocking well.   Tolerated the amiodarone well.  Cardiology also did place a magnet over it so that it would stop shocking him.  And he did would go in and out of V. tach back to more regular tachycardic rhythm.  Labs significant for white blood cell count 24,000 that is probably understandable with all the adrenaline.  Potassium was low at 2.1.  That was not known before he went upstairs.  Covid testing was negative.  Chest x-ray interstitial air space opacities in the left midlung concerning for pneumonia versus a simple metric edema.  Patient stated he felt fine yesterday.  But did state that he has been tired for a few days.  The firing of the defibrillator started just 1 hour prior to arrival.  Patient was given some Ativan and some hydromorphone for the pain.  Of the shocking.  Patient is on Eliquis.  And normally on a beta-blocker.   Final Clinical Impression(s) / ED Diagnoses Final diagnoses:  Ventricular tachycardia [facility])  Defibrillator discharge    Rx / DC Orders ED Discharge Orders    None       Fredia Sorrow, MD 06/16/20 1729

## 2020-06-16 NOTE — ED Triage Notes (Signed)
Patient arrives to ED via [facility] with c/o of multiple ICD shocks, tachycardia, and afib. Pt states he woke up this morning extremely fatigued. Pt states he has been shocked 30 times by his ICD and this started at 11am today. Pt goes into Vtach every 3-4 minutes and get shocked. Pt states he is extremely SOB and sharp chest pain. Pt received '6mg'$  and then '12mg'$  of adenosine. EMS also gave '150mg'$  amiodarone.

## 2020-06-16 NOTE — Progress Notes (Addendum)
duplicate

## 2020-06-17 DIAGNOSIS — I5042 Chronic combined systolic (congestive) and diastolic (congestive) heart failure: Secondary | ICD-10-CM

## 2020-06-17 DIAGNOSIS — I2510 Atherosclerotic heart disease of native coronary artery without angina pectoris: Secondary | ICD-10-CM | POA: Diagnosis not present

## 2020-06-17 DIAGNOSIS — I472 Ventricular tachycardia: Secondary | ICD-10-CM | POA: Diagnosis not present

## 2020-06-17 DIAGNOSIS — Z95810 Presence of automatic (implantable) cardiac defibrillator: Secondary | ICD-10-CM | POA: Diagnosis not present

## 2020-06-17 LAB — T4, FREE: Free T4: 0.98 ng/dL (ref 0.61–1.12)

## 2020-06-17 LAB — ECHOCARDIOGRAM COMPLETE
Area-P 1/2: 4.89 cm2
Height: 72 in
MV M vel: 3.65 m/s
MV Peak grad: 53.3 mmHg
S' Lateral: 5.3 cm
Weight: 2960 oz

## 2020-06-17 LAB — BASIC METABOLIC PANEL
Anion gap: 10 (ref 5–15)
BUN: 18 mg/dL (ref 8–23)
CO2: 29 mmol/L (ref 22–32)
Calcium: 8.9 mg/dL (ref 8.9–10.3)
Chloride: 100 mmol/L (ref 98–111)
Creatinine, Ser: 1.82 mg/dL — ABNORMAL HIGH (ref 0.61–1.24)
GFR, Estimated: 39 mL/min — ABNORMAL LOW (ref >60)
Glucose, Bld: 115 mg/dL — ABNORMAL HIGH (ref 70–99)
Potassium: 3.2 mmol/L — ABNORMAL LOW (ref 3.5–5.1)
Sodium: 139 mmol/L (ref 135–145)

## 2020-06-17 LAB — GLUCOSE, CAPILLARY
Glucose-Capillary: 122 mg/dL — ABNORMAL HIGH (ref 70–99)
Glucose-Capillary: 132 mg/dL — ABNORMAL HIGH (ref 70–99)
Glucose-Capillary: 120 mg/dL — ABNORMAL HIGH (ref 70–99)
Glucose-Capillary: 150 mg/dL — ABNORMAL HIGH (ref 70–99)

## 2020-06-17 LAB — LIPID PANEL
Cholesterol: 123 mg/dL (ref 0–200)
HDL: 28 mg/dL — ABNORMAL LOW (ref >40)
LDL Cholesterol: 75 mg/dL (ref 0–99)
Total CHOL/HDL Ratio: 4.4 RATIO
Triglycerides: 99 mg/dL (ref <150)
VLDL: 20 mg/dL (ref 0–40)

## 2020-06-17 MED ORDER — AMIODARONE HCL 200 MG PO TABS
400.0000 mg | ORAL_TABLET | Freq: Every day | ORAL | Status: DC
  Administered 2020-06-17 – 2020-06-19 (×3): 400 mg via ORAL
  Filled 2020-06-17 (×3): qty 2

## 2020-06-17 MED ORDER — POTASSIUM CHLORIDE CRYS ER 20 MEQ PO TBCR
40.0000 meq | EXTENDED_RELEASE_TABLET | Freq: Once | ORAL | Status: AC
  Administered 2020-06-17: 40 meq via ORAL
  Filled 2020-06-17: qty 2

## 2020-06-17 MED ORDER — LOSARTAN POTASSIUM 25 MG PO TABS
25.0000 mg | ORAL_TABLET | Freq: Every day | ORAL | Status: DC
  Administered 2020-06-17 – 2020-06-19 (×3): 25 mg via ORAL
  Filled 2020-06-17 (×3): qty 1

## 2020-06-17 MED ORDER — METOPROLOL SUCCINATE ER 25 MG PO TB24
25.0000 mg | ORAL_TABLET | Freq: Every day | ORAL | Status: DC
  Administered 2020-06-17 – 2020-06-19 (×3): 25 mg via ORAL
  Filled 2020-06-17 (×3): qty 1

## 2020-06-17 MED ORDER — POTASSIUM CHLORIDE 10 MEQ/100ML IV SOLN
10.0000 meq | INTRAVENOUS | Status: AC
  Administered 2020-06-17 (×2): 10 meq via INTRAVENOUS
  Filled 2020-06-17: qty 100

## 2020-06-17 MED ORDER — CHLORHEXIDINE GLUCONATE CLOTH 2 % EX PADS: 6.0000 | MEDICATED_PAD | Freq: Every day | CUTANEOUS | Status: DC

## 2020-06-17 NOTE — Progress Notes (Addendum)
Progress Note  Patient Name: Charles Mcdonald Date of Encounter: 06/17/2020  [facility] Cardiologist: No primary care provider on file. New (previously [place], Dr. Uvaldo Rising)  Subjective   Feeling much better. Slept well. Denies dyspnea. No further meaningful arrhythmia. K 3.2.  Inpatient Medications    Scheduled Meds: . amiodarone  400 mg Oral Daily  . apixaban  5 mg Oral Q12H  . aspirin EC  81 mg Oral Daily  . atorvastatin  40 mg Oral QHS  . Chlorhexidine Gluconate Cloth  6 each Topical Daily  . insulin aspart  0-15 Units Subcutaneous TID WC  . pantoprazole  40 mg Oral QHS  . potassium chloride  40 mEq Oral Daily  . potassium chloride  40 mEq Oral Once  . sodium chloride flush  3 mL Intravenous Q12H   Continuous Infusions: . sodium chloride    . potassium chloride     PRN Meds: sodium chloride, acetaminophen, nitroGLYCERIN, ondansetron (ZOFRAN) IV, sodium chloride flush   Vital Signs    Vitals:   06/17/20 0700 06/17/20 0727 06/17/20 0900 06/17/20 1000  BP: 117/88  101/72 111/80  Pulse: 80  79 84  Resp: (!) 22  (!) 24 (!) 26  Temp:  98.6 F (37 C)    TempSrc:  Oral    SpO2: 94%  100% 99%  Weight:      Height:        Intake/Output Summary (Last 24 hours) at 06/17/2020 1033 Last data filed at 06/17/2020 0900 Gross per 24 hour  Intake 250 ml  Output 375 ml  Net -125 ml   Last 3 Weights 06/17/2020 06/16/2020 04/25/2020  Weight (lbs) 183 lb 3.2 oz 185 lb 180 lb  Weight (kg) 83.1 kg 83.915 kg 81.647 kg      Telemetry    NSR - Personally Reviewed  ECG    NSR, RBBB (QRS 164 ms), anterolateral infarction with "frozen" anteroseptal ST elevation. QTc 551 ms - Personally Reviewed  Physical Exam  Calm, smiling GEN: No acute distress.   Neck: 8 cm JVD Cardiac: RRR, wide split S2, no murmurs, rubs, or gallops. L subclavian ICD Respiratory: Clear to auscultation bilaterally. GI: Soft, nontender, non-distended  MS: No edema; No deformity. Neuro:  Nonfocal   Psych: Normal affect   Labs    High Sensitivity Troponin:   Recent Labs  Lab 06/16/20 1637 06/16/20 1808  TROPONINIHS 1,214* 1,810*      Chemistry Recent Labs  Lab 06/16/20 1241 06/16/20 1637 06/16/20 1958 06/17/20 0123  NA 140  --  136 139  K 2.1*  --  3.5 3.2*  CL 100  --  101 100  CO2 23  --  26 29  GLUCOSE 130*  --  164* 115*  BUN 14  --  17 18  CREATININE 1.72*  --  1.89* 1.82*  CALCIUM 9.1  --  9.2 8.9  PROT  --  6.3*  --   --   ALBUMIN  --  3.1*  --   --   AST  --  26  --   --   ALT  --  14  --   --   ALKPHOS  --  105  --   --   BILITOT  --  1.0  --   --   GFRNONAA 42*  --  37* 39*  ANIONGAP 17*  --  9 10     Hematology Recent Labs  Lab 06/16/20 1241  WBC 24.6*  RBC  4.27  HGB 11.8*  HCT 37.6*  MCV 88.1  MCH 27.6  MCHC 31.4  RDW 17.6*  PLT 443*    BNPNo results for input(s): BNP, PROBNP in the last 168 hours.   DDimer No results for input(s): DDIMER in the last 168 hours.   Radiology    DG Chest Port 1 View  Result Date: 06/16/2020 CLINICAL DATA:  Tachycardia.  ICD discharging. EXAM: PORTABLE CHEST 1 VIEW COMPARISON:  04/17/20. FINDINGS: Left chest wall ICD is noted with leads in the right atrial appendage and right ventricle. Stable mild cardiac enlargement. Interstitial and airspace opacities within the left midlung and left base is identified. No signs of pleural effusion. IMPRESSION: 1. Asymmetric interstitial and airspace opacities in the left midlung and left base concerning for pneumonia versus asymmetric edema 2. Mild cardiac enlargement. Electronically Signed   By: Kerby Moors M.D.   On: 06/16/2020 13:35    Cardiac Studies  Echocardiogram 04/12/2020 1. Left ventricular ejection fraction, by estimation, is 20 to 25%. The  left ventricle has severely decreased function. The left ventricle  demonstrates global hypokinesis. The left ventricular internal cavity size  was mildly dilated. Left ventricular  diastolic parameters are  consistent with Grade I diastolic dysfunction  (impaired relaxation).  2. Right ventricular systolic function is moderately reduced. The right  ventricular size is mildly enlarged.  3. Left atrial size was moderately dilated.  4. Pacer wire seen. Right atrial size was mildly dilated.  5. The mitral valve is degenerative. Mild mitral valve regurgitation.  6. The aortic valve is tricuspid. There is mild calcification of the  aortic valve. There is mild thickening of the aortic valve. Aortic valve  regurgitation is not visualized. Mild aortic valve sclerosis is present,  with no evidence of aortic valve  stenosis.  7. There is mild (Grade II) atheroma plaque involving the aortic root and  ascending aorta.  8. The inferior vena cava is normal in size with <50% respiratory  variability, suggesting right atrial pressure of 8 mmHg.    Current echo pending review  Patient Profile     75 y.o. male with longstanding history of ischemic cardiomyopathy, chronic combined systolic and diastolic heart failure with severely depressed left ventricular systolic function, extensive CAD, reportedly not amenable to surgical revascularization, status post Medtronic ICD, presenting with VT storm and severe hypokalemia.  Assessment & Plan    1. VT storm: Had 103 shocks in under 2 hours. Secondary to severe hypokalemia, although cannot exclude contribution of ischemia.  VT is monomorphic, suggesting scar as the substrate, but the episodes are triggered by brief ventricular couplets.  Doing much better on amiodarone, suspect we will continue this as a chronic medication.  Switch to PO today. Transfer to telemetry. 2. CHF: Seems to be in a protracted episode of mild systolic heart failure throughout the last several months.  Resume diuretics after we correct his hypokalemia.  Severe LV dysfunction and has clinical signs of hypervolemia, but not dyspneic.  Note history of attempts at CRT that were  unsuccessful.  Resume losartan, BP is better. Continue metoprolol. Consider Entresto and/or spironolactone as OP if BP and renal function allow. Should NOT be given SGLT2 inhibitors with history of Fournier's gangrene. 3. CAD: Longstanding and complicated history.  Sounds like he does not have any great options for revascularization.  As expected, the troponin is mildly elevated after so many defibrillator shocks.  No evidence for ACS (other than the VT), no plan for coronary angiography. 4.  History of atrial fibrillation: On apixaban.  Reports no recent bleeding complications.  Hemoglobin 11.8. 5. ICD: Need to clarify why his high-voltage lead is "under advisory"; do not have those details at this time.   RA and RV lead parameters all appear to be normal.  After the numerous ICD shocks that he received his current device longevity is only expected to be 15 months.  Note attempts at CRT upgrade unsuccessful due to repeated dislodgment of the coronary sinus passive lead.  Consideration has been given to use of an epicardial lead or an active fixation coronary sinus lead.  He has a Medtronic CRT-D device implanted, but with the LV port pinned. 6. Hypokalemia: Aggressive replacement with oral and intravenous supplements as planned.  Indeed, his list of home medications does not include a potassium supplement or spironolactone. Will need a K supplement chronically. 7. CKD 3B: Baseline creatinine seems to be 1.7-2.0, he is in that range today. 8. 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 Since that time, he has a colostomy (note plans for possible reversal) and PEG (not using). PEG site not very healthy looking. Ostomy nurse consult. 9. Elevated TSH: a little too high for "sick euthyroid sd". Check free T4. Note likely future amio related issues.   For questions or updates, please contact [place] Please consult [email or web address] for contact info under        Signed, Sanda Klein, MD  06/17/2020, 10:33 AM

## 2020-06-18 DIAGNOSIS — E876 Hypokalemia: Secondary | ICD-10-CM | POA: Diagnosis not present

## 2020-06-18 DIAGNOSIS — I2583 Coronary atherosclerosis due to lipid rich plaque: Secondary | ICD-10-CM

## 2020-06-18 DIAGNOSIS — I420 Dilated cardiomyopathy: Secondary | ICD-10-CM

## 2020-06-18 DIAGNOSIS — I2510 Atherosclerotic heart disease of native coronary artery without angina pectoris: Secondary | ICD-10-CM | POA: Diagnosis not present

## 2020-06-18 DIAGNOSIS — I472 Ventricular tachycardia: Secondary | ICD-10-CM | POA: Diagnosis not present

## 2020-06-18 LAB — BASIC METABOLIC PANEL
Anion gap: 8 (ref 5–15)
Anion gap: 8 (ref 5–15)
BUN: 20 mg/dL (ref 8–23)
BUN: 20 mg/dL (ref 8–23)
CO2: 29 mmol/L (ref 22–32)
CO2: 29 mmol/L (ref 22–32)
Calcium: 9.2 mg/dL (ref 8.9–10.3)
Calcium: 9.6 mg/dL (ref 8.9–10.3)
Chloride: 103 mmol/L (ref 98–111)
Chloride: 103 mmol/L (ref 98–111)
Creatinine, Ser: 1.87 mg/dL — ABNORMAL HIGH (ref 0.61–1.24)
Creatinine, Ser: 1.92 mg/dL — ABNORMAL HIGH (ref 0.61–1.24)
GFR, Estimated: 38 mL/min — ABNORMAL LOW (ref >60)
GFR, Estimated: 37 mL/min — ABNORMAL LOW (ref >60)
Glucose, Bld: 111 mg/dL — ABNORMAL HIGH (ref 70–99)
Glucose, Bld: 118 mg/dL — ABNORMAL HIGH (ref 70–99)
Potassium: 3.3 mmol/L — ABNORMAL LOW (ref 3.5–5.1)
Potassium: 4.1 mmol/L (ref 3.5–5.1)
Sodium: 140 mmol/L (ref 135–145)
Sodium: 140 mmol/L (ref 135–145)

## 2020-06-18 LAB — NA AND K (SODIUM & POTASSIUM), RAND UR
Potassium Urine: 41 mmol/L
Sodium, Ur: <10 mmol/L

## 2020-06-18 LAB — GLUCOSE, CAPILLARY
Glucose-Capillary: 168 mg/dL — ABNORMAL HIGH (ref 70–99)
Glucose-Capillary: 127 mg/dL — ABNORMAL HIGH (ref 70–99)
Glucose-Capillary: 134 mg/dL — ABNORMAL HIGH (ref 70–99)
Glucose-Capillary: 115 mg/dL — ABNORMAL HIGH (ref 70–99)

## 2020-06-18 LAB — CREATININE, URINE, RANDOM: Creatinine, Urine: 234.92 mg/dL

## 2020-06-18 LAB — MAGNESIUM: Magnesium: 2 mg/dL (ref 1.7–2.4)

## 2020-06-18 MED ORDER — POTASSIUM CHLORIDE CRYS ER 20 MEQ PO TBCR
40.0000 meq | EXTENDED_RELEASE_TABLET | Freq: Two times a day (BID) | ORAL | Status: DC
  Administered 2020-06-18: 40 meq via ORAL

## 2020-06-18 MED ORDER — POTASSIUM CHLORIDE CRYS ER 20 MEQ PO TBCR: 40.0000 meq | EXTENDED_RELEASE_TABLET | Freq: Two times a day (BID) | ORAL | Status: DC

## 2020-06-18 MED ORDER — POTASSIUM CHLORIDE CRYS ER 20 MEQ PO TBCR
40.0000 meq | EXTENDED_RELEASE_TABLET | Freq: Once | ORAL | Status: AC
  Administered 2020-06-18: 40 meq via ORAL
  Filled 2020-06-18: qty 2

## 2020-06-18 NOTE — Consult Note (Signed)
[place] Nurse ostomy follow up Patient receiving care in [place].  Primary RN in room at time of my visit. Stoma type/location: LUQ colostomy Stomal assessment/size: approximately 1 3/8 inches as measure through existing pouch. Pink, budded. Wife normally does ostomy care. One piece pouch with clip in place. Peristomal assessment: deferred, supplies had to be obtained Treatment options for stomal/peristomal skin: barrier ring if needed Output: formed brown Ostomy pouching: 1pc. Kellie Simmering [phone] requested [name] to order for patient Education provided: none, created last year while patient in [place] also requested for PEG site care.  Ordered to remove existing dressing, wash around site with soap and water, place a split gauze. [place] nurse will not follow at this time.  Please re-consult the [place] team if needed.  Val Riles, RN, MSN, CWOCN, CNS-BC, pager [phone]

## 2020-06-18 NOTE — Discharge Instructions (Addendum)
WEIGH DAILY, every am, wearing the same amount of clothing Record weights, and bring it to office appointments Contact Sanda Klein, MD for weight gain of 3 lbs in a day or 5 lbs in a week Limit sodium to 500 mg per meal, total 2000 mg per day Limit all liquids to 1.5-2 liters/quarts per day   Information on my medicine - ELIQUIS (apixaban) Why was Eliquis prescribed for you? Eliquis was prescribed for you to reduce the risk of a blood clot forming that can cause a stroke if you have a medical condition called atrial fibrillation (a type of irregular heartbeat).  What do You need to know about Eliquis ? Take your Eliquis TWICE DAILY - one tablet in the morning and one tablet in the evening with or without food. If you have difficulty swallowing the tablet whole please discuss with your pharmacist how to take the medication safely.  Take Eliquis exactly as prescribed by your doctor and DO NOT stop taking Eliquis without talking to the doctor who prescribed the medication.  Stopping may increase your risk of developing a stroke.  Refill your prescription before you run out.  After discharge, you should have regular check-up appointments with your healthcare provider that is prescribing your Eliquis.  In the future your dose may need to be changed if your kidney function or weight changes by a significant amount or as you get older.  What do you do if you miss a dose? If you miss a dose, take it as soon as you remember on the same day and resume taking twice daily.  Do not take more than one dose of ELIQUIS at the same time to make up a missed dose.  Important Safety Information A possible side effect of Eliquis is bleeding. You should call your healthcare provider right away if you experience any of the following: ? Bleeding from an injury or your nose that does not stop. ? Unusual colored urine (red or dark brown) or unusual colored stools (red or black). ? Unusual bruising for  unknown reasons. ? A serious fall or if you hit your head (even if there is no bleeding).  Some medicines may interact with Eliquis and might increase your risk of bleeding or clotting while on Eliquis. To help avoid this, consult your healthcare provider or pharmacist prior to using any new prescription or non-prescription medications, including herbals, vitamins, non-steroidal anti-inflammatory drugs (NSAIDs) and supplements.  This website has more information on Eliquis (apixaban): http://www.eliquis.com/eliquis/home

## 2020-06-18 NOTE — Plan of Care (Signed)
  Problem: Education: Goal: Knowledge of General Education information will improve Description: Including pain rating scale, medication(s)/side effects and non-pharmacologic comfort measures Outcome: Progressing   Problem: Health Behavior/Discharge Planning: Goal: Ability to manage health-related needs will improve Outcome: Progressing   Problem: Clinical Measurements: Goal: Ability to maintain clinical measurements within normal limits will improve Outcome: Progressing Goal: Will remain free from infection Outcome: Progressing Goal: Diagnostic test results will improve Outcome: Progressing Goal: Respiratory complications will improve Outcome: Progressing Goal: Cardiovascular complication will be avoided Outcome: Progressing   Problem: Clinical Measurements: Goal: Ability to maintain clinical measurements within normal limits will improve Outcome: Progressing Goal: Will remain free from infection Outcome: Progressing Goal: Diagnostic test results will improve Outcome: Progressing Goal: Respiratory complications will improve Outcome: Progressing Goal: Cardiovascular complication will be avoided Outcome: Progressing

## 2020-06-18 NOTE — Progress Notes (Signed)
Progress Note  Patient Name: Charles Mcdonald Date of Encounter: 06/18/2020  [facility] Cardiologist: No primary care provider on file. New (previously [place], Dr. Uvaldo Rising)  Subjective   Denies any chest pain or SOB today.  Denies any palpitations.  Tele with no VT  Inpatient Medications    Scheduled Meds: . amiodarone  400 mg Oral Daily  . apixaban  5 mg Oral Q12H  . aspirin EC  81 mg Oral Daily  . atorvastatin  40 mg Oral QHS  . Chlorhexidine Gluconate Cloth  6 each Topical Daily  . insulin aspart  0-15 Units Subcutaneous TID WC  . losartan  25 mg Oral Daily  . metoprolol succinate  25 mg Oral Daily  . pantoprazole  40 mg Oral QHS  . potassium chloride  40 mEq Oral Daily  . sodium chloride flush  3 mL Intravenous Q12H   Continuous Infusions: . sodium chloride     PRN Meds: sodium chloride, acetaminophen, nitroGLYCERIN, ondansetron (ZOFRAN) IV, sodium chloride flush   Vital Signs    Vitals:   06/17/20 1200 06/17/20 1230 06/17/20 2016 06/18/20 0444  BP: (!) 121/91 105/77 109/83 101/70  Pulse: 94 92 86 71  Resp: '16 18 18 16  '$ Temp:  98 F (36.7 C) 98.3 F (36.8 C) 98.4 F (36.9 C)  TempSrc:  Oral Oral Oral  SpO2: 98% 97% 97% 100%  Weight:    84.2 kg  Height:        Intake/Output Summary (Last 24 hours) at 06/18/2020 0945 Last data filed at 06/18/2020 [id number] Gross per 24 hour  Intake 600 ml  Output 530 ml  Net 70 ml   Last 3 Weights 06/18/2020 06/17/2020 06/16/2020  Weight (lbs) 185 lb 9.6 oz 183 lb 3.2 oz 185 lb  Weight (kg) 84.188 kg 83.1 kg 83.915 kg      Telemetry    NSR - Personally Reviewed  ECG    NSR with nonspecific IVCD  Physical Exam  GEN: Well nourished, well developed in no acute distress HEENT: Normal NECK: No JVD; No carotid bruits LYMPHATICS: No lymphadenopathy CARDIAC:RRR, no murmurs, rubs, gallops RESPIRATORY:  Clear to auscultation without rales, wheezing or rhonchi  ABDOMEN: Soft, non-tender, non-distended MUSCULOSKELETAL:   No edema; No deformity  SKIN: Warm and dry NEUROLOGIC:  Alert and oriented x 3 PSYCHIATRIC:  Normal affect    Labs    High Sensitivity Troponin:   Recent Labs  Lab 06/16/20 1637 06/16/20 1808  TROPONINIHS 1,214* 1,810*      Chemistry Recent Labs  Lab 06/16/20 1637 06/16/20 1958 06/17/20 0123 06/18/20 0230  NA  --  136 139 140  K  --  3.5 3.2* 3.3*  CL  --  101 100 103  CO2  --  '26 29 29  '$ GLUCOSE  --  164* 115* 111*  BUN  --  '17 18 20  '$ CREATININE  --  1.89* 1.82* 1.87*  CALCIUM  --  9.2 8.9 9.2  PROT 6.3*  --   --   --   ALBUMIN 3.1*  --   --   --   AST 26  --   --   --   ALT 14  --   --   --   ALKPHOS 105  --   --   --   BILITOT 1.0  --   --   --   GFRNONAA  --  37* 39* 38*  ANIONGAP  --  9 10 8  Hematology Recent Labs  Lab 06/16/20 1241  WBC 24.6*  RBC 4.27  HGB 11.8*  HCT 37.6*  MCV 88.1  MCH 27.6  MCHC 31.4  RDW 17.6*  PLT 443*    BNPNo results for input(s): BNP, PROBNP in the last 168 hours.   DDimer No results for input(s): DDIMER in the last 168 hours.   Radiology    DG Chest Port 1 View  Result Date: 06/16/2020 CLINICAL DATA:  Tachycardia.  ICD discharging. EXAM: PORTABLE CHEST 1 VIEW COMPARISON:  04/17/20. FINDINGS: Left chest wall ICD is noted with leads in the right atrial appendage and right ventricle. Stable mild cardiac enlargement. Interstitial and airspace opacities within the left midlung and left base is identified. No signs of pleural effusion. IMPRESSION: 1. Asymmetric interstitial and airspace opacities in the left midlung and left base concerning for pneumonia versus asymmetric edema 2. Mild cardiac enlargement. Electronically Signed   By: Kerby Moors M.D.   On: 06/16/2020 13:35   ECHOCARDIOGRAM COMPLETE  Result Date: 06/17/2020    ECHOCARDIOGRAM REPORT   Patient Name:   Charles Mcdonald Date of Exam: 06/16/2020 Medical Rec #:  [id number]       Height:       72.0 in Accession #:    [id number]      Weight:       185.0 lb Date  of Birth:  1946/01/20      BSA:          2.061 m Patient Age:    75 years        BP:           105/81 mmHg Patient Gender: M               HR:           85 bpm. Exam Location:  Inpatient Procedure: 2D Echo Indications:    acute systolic chf  History:        Patient has prior history of Echocardiogram examinations, most                 recent 04/12/2020. Defibrillator, chronic kidney disease;                 Arrythmias:Atrial Fibrillation and V-Tach.  Sonographer:    Johny Chess Referring Phys: [place]  1. Left ventricular ejection fraction, by estimation, is 20 to 25%. The left ventricle has severely decreased function. The left ventricle demonstrates regional wall motion abnormalities (see scoring diagram/findings for description). The left ventricular internal cavity size was mildly dilated. Left ventricular diastolic parameters are consistent with Grade II diastolic dysfunction (pseudonormalization). Elevated left atrial pressure. There is scarring and akinesis of the left ventricular, entire anteroseptal wall, anterior wall and apical segment. There is moderate hypokinesis of the left ventricular, entire inferoseptal wall, inferior wall and inferolateral wall.  2. Right ventricular systolic function is mildly reduced. The right ventricular size is normal. There is mildly elevated pulmonary artery systolic pressure.  3. Left atrial size was severely dilated.  4. The mitral valve is normal in structure. Mild mitral valve regurgitation.  5. The aortic valve is tricuspid. Aortic valve regurgitation is not visualized. Mild aortic valve sclerosis is present, with no evidence of aortic valve stenosis.  6. The inferior vena cava is dilated in size with <50% respiratory variability, suggesting right atrial pressure of 15 mmHg. Comparison(s): No significant change from prior study. Prior images reviewed side by side. FINDINGS  Left Ventricle: Left ventricular  ejection fraction, by estimation,  is 20 to 25%. The left ventricle has severely decreased function. The left ventricle demonstrates regional wall motion abnormalities. Scarring and akinesis of the left ventricular, entire anteroseptal wall, anterior wall and apical segment. Moderate hypokinesis of the left ventricular, entire inferoseptal wall, inferior wall and inferolateral wall. The left ventricular internal cavity size was mildly dilated. There is no left ventricular hypertrophy. Left ventricular diastolic parameters are consistent with Grade II diastolic dysfunction (pseudonormalization). Elevated left atrial pressure.  LV Wall Scoring: The mid and distal anterior wall, entire anterior septum, and entire apex are akinetic. The inferior wall, posterior wall, mid inferoseptal segment, and basal inferoseptal segment are hypokinetic. The antero-lateral wall and basal anterior segment are normal. Right Ventricle: The right ventricular size is normal. No increase in right ventricular wall thickness. Right ventricular systolic function is mildly reduced. There is mildly elevated pulmonary artery systolic pressure. The tricuspid regurgitant velocity  is 2.38 m/s, and with an assumed right atrial pressure of 15 mmHg, the estimated right ventricular systolic pressure is 0000000 mmHg. Left Atrium: Left atrial size was severely dilated. Right Atrium: Right atrial size was normal in size. Pericardium: There is no evidence of pericardial effusion. Mitral Valve: The mitral valve is normal in structure. Mild mitral valve regurgitation, with centrally-directed jet. Tricuspid Valve: The tricuspid valve is normal in structure. Tricuspid valve regurgitation is trivial. Aortic Valve: The aortic valve is tricuspid. Aortic valve regurgitation is not visualized. Mild aortic valve sclerosis is present, with no evidence of aortic valve stenosis. Pulmonic Valve: The pulmonic valve was normal in structure. Pulmonic valve regurgitation is not visualized. Aorta: The aortic  root and ascending aorta are structurally normal, with no evidence of dilitation. Venous: The inferior vena cava is dilated in size with less than 50% respiratory variability, suggesting right atrial pressure of 15 mmHg. IAS/Shunts: No atrial level shunt detected by color flow Doppler. Additional Comments: A device lead is visualized. There is a small pleural effusion in the right lateral region.  LEFT VENTRICLE PLAX 2D LVIDd:         5.90 cm  Diastology LVIDs:         5.30 cm  LV e' medial:    3.08 cm/s LV PW:         0.90 cm  LV E/e' medial:  25.5 LV IVS:        0.80 cm  LV e' lateral:   5.63 cm/s LVOT diam:     2.00 cm  LV E/e' lateral: 13.9 LV SV:         35 LV SV Index:   17 LVOT Area:     3.14 cm  RIGHT VENTRICLE            IVC RV S prime:     7.80 cm/s  IVC diam: 2.10 cm TAPSE (M-mode): 1.3 cm LEFT ATRIUM              Index       RIGHT ATRIUM           Index LA diam:        3.90 cm  1.89 cm/m  RA Area:     18.10 cm LA Vol (A2C):   105.0 ml 50.94 ml/m RA Volume:   51.70 ml  25.08 ml/m LA Vol (A4C):   90.3 ml  43.81 ml/m LA Biplane Vol: 100.0 ml 48.52 ml/m  AORTIC VALVE LVOT Vmax:   76.40 cm/s LVOT Vmean:  44.500 cm/s LVOT VTI:  0.112 m  AORTA Ao Root diam: 3.00 cm Ao Asc diam:  3.10 cm MITRAL VALVE               TRICUSPID VALVE MV Area (PHT): 4.89 cm    TR Peak grad:   22.7 mmHg MV Decel Time: 155 msec    TR Vmax:        238.00 cm/s MR Peak grad: 53.3 mmHg MR Mean grad: 34.0 mmHg    SHUNTS MR Vmax:      365.00 cm/s  Systemic VTI:  0.11 m MR Vmean:     272.0 cm/s   Systemic Diam: 2.00 cm MV E velocity: 78.40 cm/s MV A velocity: 52.70 cm/s MV E/A ratio:  1.49 Mihai Croitoru MD Electronically signed by Sanda Klein MD Signature Date/Time: 06/17/2020/12:22:40 PM    Final     Cardiac Studies  Echocardiogram 04/12/2020 1. Left ventricular ejection fraction, by estimation, is 20 to 25%. The  left ventricle has severely decreased function. The left ventricle  demonstrates global hypokinesis. The left  ventricular internal cavity size  was mildly dilated. Left ventricular  diastolic parameters are consistent with Grade I diastolic dysfunction  (impaired relaxation).  2. Right ventricular systolic function is moderately reduced. The right  ventricular size is mildly enlarged.  3. Left atrial size was moderately dilated.  4. Pacer wire seen. Right atrial size was mildly dilated.  5. The mitral valve is degenerative. Mild mitral valve regurgitation.  6. The aortic valve is tricuspid. There is mild calcification of the  aortic valve. There is mild thickening of the aortic valve. Aortic valve  regurgitation is not visualized. Mild aortic valve sclerosis is present,  with no evidence of aortic valve  stenosis.  7. There is mild (Grade II) atheroma plaque involving the aortic root and  ascending aorta.  8. The inferior vena cava is normal in size with <50% respiratory  variability, suggesting right atrial pressure of 8 mmHg.    Current echo pending review  Patient Profile     75 y.o. male with longstanding history of ischemic cardiomyopathy, chronic combined systolic and diastolic heart failure with severely depressed left ventricular systolic function, extensive CAD, reportedly not amenable to surgical revascularization, status post Medtronic ICD, presenting with VT storm and severe hypokalemia.  Assessment & Plan    VT storm: - Had 103 shocks in under 2 hours.  -Secondary to severe hypokalemia, although cannot exclude contribution of ischemia.   -VT was monomorphic, suggesting scar as the substrate, but the episodes were triggered by brief ventricular couplets. -Doing much better on amiodarone, suspect we will continue this as a chronic medication.   -no further VT on tele -continue Amio '400mg'$  daily  CHF:  -Seems to be in a protracted episode of mild systolic heart failure throughout the last several months.  -noted to have Severe LV dysfunction and clinical signs of  hypervolemia on admission.   -Note history of attempts at CRT that were unsuccessful.    -Continue Toprol XL '25mg'$  daily and Losartan '25mg'$  daily -currently off diuretics -Consider Entresto and/or spironolactone as OP if BP and renal function allow.  -Should NOT be given SGLT2 inhibitors with history of Fournier's gangrene.  CAD:  -Longstanding and complicated history with no great options for revascularization.   -As expected, the troponin is mildly elevated after so many defibrillator shocks.   -No evidence for ACS (other than the VT), no plan for coronary angiography. -continue ASA, BB and statin  History of atrial fibrillation:  -  remains in NSR -no bleeding problems on DOAC -continue apixaban '5mg'$  BID, Toprol XL '25mg'$  daily and Amio  ICD:  -Need to clarify why his high-voltage lead is "under advisory"; do not have those details at this time.    -RA and RV lead parameters all appear to be normal.   -After the numerous ICD shocks that he received his current device longevity is only expected to be 15 months.  - Note attempts at CRT upgrade unsuccessful due to repeated dislodgment of the coronary sinus passive lead. -Consideration has been given to use of an epicardial lead or an active fixation coronary sinus lead.   -He has a Medtronic CRT-D device implanted, but with the LV port pinned.  Hypokalemia:  -Continue aggressive replacement with oral and intravenous supplements as planned.  -Indeed, his list of home medications does not include a potassium supplement or spironolactone.  -Will need a K supplement chronically. 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   Elevated TSH:  -a little too high for "sick euthyroid sd".  -Free T2 normal -Note likely future amio related issues.  He is very anxious to go home but need to get K+ back in normal range.  I have spent a total of 35 minutes with patient reviewing 2D echo , telemetry, EKGs, labs and examining patient as well as establishing an assessment and plan that was discussed with the patient.  > 50% of time was spent in direct patient care.    For questions or updates, please contact [facility] consult [email or web address] for contact info under        Signed, TrFransico HimMD  06/18/2020, 9:45 AM

## 2020-06-19 ENCOUNTER — Encounter (HOSPITAL_COMMUNITY): Payer: Self-pay | Admitting: Cardiovascular Disease

## 2020-06-19 DIAGNOSIS — I420 Dilated cardiomyopathy: Secondary | ICD-10-CM | POA: Diagnosis not present

## 2020-06-19 DIAGNOSIS — E7800 Pure hypercholesterolemia, unspecified: Secondary | ICD-10-CM

## 2020-06-19 DIAGNOSIS — I472 Ventricular tachycardia: Secondary | ICD-10-CM | POA: Diagnosis not present

## 2020-06-19 DIAGNOSIS — I2510 Atherosclerotic heart disease of native coronary artery without angina pectoris: Secondary | ICD-10-CM | POA: Diagnosis not present

## 2020-06-19 DIAGNOSIS — I4819 Other persistent atrial fibrillation: Secondary | ICD-10-CM | POA: Diagnosis present

## 2020-06-19 DIAGNOSIS — I10 Essential (primary) hypertension: Secondary | ICD-10-CM

## 2020-06-19 DIAGNOSIS — E876 Hypokalemia: Secondary | ICD-10-CM | POA: Diagnosis present

## 2020-06-19 HISTORY — DX: Atherosclerotic heart disease of native coronary artery without angina pectoris: I25.10

## 2020-06-19 LAB — BASIC METABOLIC PANEL
Anion gap: 10 (ref 5–15)
BUN: 20 mg/dL (ref 8–23)
CO2: 25 mmol/L (ref 22–32)
Calcium: 9.3 mg/dL (ref 8.9–10.3)
Chloride: 105 mmol/L (ref 98–111)
Creatinine, Ser: 1.72 mg/dL — ABNORMAL HIGH (ref 0.61–1.24)
GFR, Estimated: 42 mL/min — ABNORMAL LOW (ref >60)
Glucose, Bld: 114 mg/dL — ABNORMAL HIGH (ref 70–99)
Potassium: 3.9 mmol/L (ref 3.5–5.1)
Sodium: 140 mmol/L (ref 135–145)

## 2020-06-19 LAB — GLUCOSE, CAPILLARY
Glucose-Capillary: 148 mg/dL — ABNORMAL HIGH (ref 70–99)
Glucose-Capillary: 199 mg/dL — ABNORMAL HIGH (ref 70–99)

## 2020-06-19 LAB — MAGNESIUM: Magnesium: 2 mg/dL (ref 1.7–2.4)

## 2020-06-19 MED ORDER — ZOLPIDEM TARTRATE 5 MG PO TABS
5.0000 mg | ORAL_TABLET | Freq: Once | ORAL | Status: AC
  Administered 2020-06-19: 5 mg via ORAL
  Filled 2020-06-19: qty 1

## 2020-06-19 MED ORDER — LOSARTAN POTASSIUM 25 MG PO TABS: 25.0000 mg | ORAL_TABLET | Freq: Every day | ORAL | 11 refills | Status: DC

## 2020-06-19 MED ORDER — AMIODARONE HCL 200 MG PO TABS: 400.0000 mg | ORAL_TABLET | Freq: Every day | ORAL | 3 refills | Status: DC

## 2020-06-19 MED ORDER — NITROGLYCERIN 0.4 MG SL SUBL: 0.4000 mg | SUBLINGUAL_TABLET | SUBLINGUAL | 12 refills | Status: AC | PRN

## 2020-06-19 MED ORDER — POTASSIUM CHLORIDE CRYS ER 20 MEQ PO TBCR: 40.0000 meq | EXTENDED_RELEASE_TABLET | Freq: Two times a day (BID) | ORAL | 6 refills | Status: DC

## 2020-06-19 NOTE — Progress Notes (Addendum)
Progress Note  Patient Name: Charles Mcdonald Date of Encounter: 06/19/2020  [facility] Cardiologist: No primary care provider on file. New (previously [place], Dr. Uvaldo Rising)  Subjective   No chest pain or SOB since I saw her yesterday. Tele shows NSR with occasional PVCs  Inpatient Medications    Scheduled Meds: . amiodarone  400 mg Oral Daily  . apixaban  5 mg Oral Q12H  . aspirin EC  81 mg Oral Daily  . atorvastatin  40 mg Oral QHS  . Chlorhexidine Gluconate Cloth  6 each Topical Daily  . insulin aspart  0-15 Units Subcutaneous TID WC  . losartan  25 mg Oral Daily  . metoprolol succinate  25 mg Oral Daily  . pantoprazole  40 mg Oral QHS  . sodium chloride flush  3 mL Intravenous Q12H   Continuous Infusions: . sodium chloride     PRN Meds: sodium chloride, acetaminophen, nitroGLYCERIN, ondansetron (ZOFRAN) IV, sodium chloride flush   Vital Signs    Vitals:   06/18/20 2018 06/19/20 0417 06/19/20 0500 06/19/20 0756  BP: 121/76 118/84  121/82  Pulse: 84 75  (!) 105  Resp: '16 15  16  '$ Temp: 97.7 F (36.5 C) 97.7 F (36.5 C)  98.3 F (36.8 C)  TempSrc: Oral Oral  Oral  SpO2: 99% 98%  94%  Weight:   83.9 kg   Height:        Intake/Output Summary (Last 24 hours) at 06/19/2020 0807 Last data filed at 06/19/2020 0800 Gross per 24 hour  Intake 1080 ml  Output 450 ml  Net 630 ml   Last 3 Weights 06/19/2020 06/18/2020 06/17/2020  Weight (lbs) 184 lb 15.5 oz 185 lb 9.6 oz 183 lb 3.2 oz  Weight (kg) 83.9 kg 84.188 kg 83.1 kg      Telemetry    NSR with occasional PVCs- Personally Reviewed  ECG    No new EKG to review  Physical Exam  GEN: Well nourished, well developed in no acute distress HEENT: Normal NECK: No JVD; No carotid bruits LYMPHATICS: No lymphadenopathy CARDIAC:RRR, no murmurs, rubs, gallops RESPIRATORY:  Clear to auscultation without rales, wheezing or rhonchi  ABDOMEN: Soft, non-tender, non-distended MUSCULOSKELETAL:  No edema; No deformity   SKIN: Warm and dry NEUROLOGIC:  Alert and oriented x 3 PSYCHIATRIC:  Normal affect    Labs    High Sensitivity Troponin:   Recent Labs  Lab 06/16/20 1637 06/16/20 1808  TROPONINIHS 1,214* 1,810*      Chemistry Recent Labs  Lab 06/16/20 1637 06/16/20 1958 06/18/20 0230 06/18/20 1643 06/19/20 0302  NA  --    < > 140 140 140  K  --    < > 3.3* 4.1 3.9  CL  --    < > 103 103 105  CO2  --    < > '29 29 25  '$ GLUCOSE  --    < > 111* 118* 114*  BUN  --    < > '20 20 20  '$ CREATININE  --    < > 1.87* 1.92* 1.72*  CALCIUM  --    < > 9.2 9.6 9.3  PROT 6.3*  --   --   --   --   ALBUMIN 3.1*  --   --   --   --   AST 26  --   --   --   --   ALT 14  --   --   --   --  ALKPHOS 105  --   --   --   --   BILITOT 1.0  --   --   --   --   GFRNONAA  --    < > 38* 37* 42*  ANIONGAP  --    < > '8 8 10   '$ < > = values in this interval not displayed.     Hematology Recent Labs  Lab 06/16/20 1241  WBC 24.6*  RBC 4.27  HGB 11.8*  HCT 37.6*  MCV 88.1  MCH 27.6  MCHC 31.4  RDW 17.6*  PLT 443*    BNPNo results for input(s): BNP, PROBNP in the last 168 hours.   DDimer No results for input(s): DDIMER in the last 168 hours.   Radiology    No results found.  Cardiac Studies  Echocardiogram 04/12/2020 1. Left ventricular ejection fraction, by estimation, is 20 to 25%. The  left ventricle has severely decreased function. The left ventricle  demonstrates global hypokinesis. The left ventricular internal cavity size  was mildly dilated. Left ventricular  diastolic parameters are consistent with Grade I diastolic dysfunction  (impaired relaxation).  2. Right ventricular systolic function is moderately reduced. The right  ventricular size is mildly enlarged.  3. Left atrial size was moderately dilated.  4. Pacer wire seen. Right atrial size was mildly dilated.  5. The mitral valve is degenerative. Mild mitral valve regurgitation.  6. The aortic valve is tricuspid. There is mild  calcification of the  aortic valve. There is mild thickening of the aortic valve. Aortic valve  regurgitation is not visualized. Mild aortic valve sclerosis is present,  with no evidence of aortic valve  stenosis.  7. There is mild (Grade II) atheroma plaque involving the aortic root and  ascending aorta.  8. The inferior vena cava is normal in size with <50% respiratory  variability, suggesting right atrial pressure of 8 mmHg.    Current echo pending review  Patient Profile     75 y.o. male with longstanding history of ischemic cardiomyopathy, chronic combined systolic and diastolic heart failure with severely depressed left ventricular systolic function, extensive CAD, reportedly not amenable to surgical revascularization, status post Medtronic ICD, presenting with VT storm and severe hypokalemia.  Assessment & Plan    VT storm: - Had 103 shocks in under 2 hours.  -Secondary to severe hypokalemia, although cannot exclude contribution of ischemia.   -VT was monomorphic, suggesting scar as the substrate, but the episodes were triggered by brief ventricular couplets. -Doing much better on amiodarone, suspect we will continue this as a chronic medication.   -no further VT on tele -continue Amio '400mg'$  daily -need to keep K+>4 and Mag>2  CHF:  -Seems to be in a protracted episode of mild systolic heart failure throughout the last several months.  -noted to have Severe LV dysfunction and clinical signs of hypervolemia on admission.   -Note history of attempts at CRT that were unsuccessful.    -Continue Toprol XL '25mg'$  daily and Losartan '25mg'$  daily -currently off diuretics -Consider Entresto and/or spironolactone as OP if BP and renal function allow.  -Should NOT be given SGLT2 inhibitors with history of Fournier's gangrene.  CAD:  -Longstanding and complicated history with no great options for revascularization.   -As expected, the troponin is mildly elevated after so many  defibrillator shocks.   -No evidence for ACS (other than the VT), no plan for coronary angiography. -continue ASA, BB and statin  History of atrial fibrillation:  -  remains in NSR -no bleeding problems on DOAC -continue apixaban '5mg'$  BID, Toprol XL '25mg'$  daily and Amio  ICD:  -Need to clarify why his high-voltage lead is "under advisory"; do not have those details at this time.    -RA and RV lead parameters all appear to be normal.   -After the numerous ICD shocks that he received his current device longevity is only expected to be 15 months.  - Note attempts at CRT upgrade unsuccessful due to repeated dislodgment of the coronary sinus passive lead. -Consideration has been given to use of an epicardial lead or an active fixation coronary sinus lead.   -He has a Medtronic CRT-D device implanted, but with the LV port pinned.  Hypokalemia:  -Continue aggressive replacement  -his list of home medications did not include a potassium supplement or spironolactone.  -low K+ likely related to GI losses through colostomy  -Will need a K supplement chronically. 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   Elevated TSH:  -a little too high for "sick euthyroid sd".  -Free T2 normal -Note likely future amio related issues.  He is anxious to go home and is stable for discharge.    Will send home on the following Kdur 429m BID Amio '400mg'$  daily Eliquis '5mg'$  BID ASA '81mg'$  daily Atorvastatin '40mg'$  daily Losartan '25mg'$  daily Toprol XL '25mg'$  daily  BMET check tomorrow in office TOC  followup with Dr. CrSallyanne Kustern 1 week and ICD lead also needs to be addressed at that OV appt (high voltage lead under advisory)   For questions or updates, please contact [facility] consult [email or web address] for contact info under        Signed, TrFransico HimMD  06/19/2020, 8:07 AM

## 2020-06-19 NOTE — Discharge Summary (Signed)
Discharge Summary    Patient ID: Charles Mcdonald MRN: [id number]; DOB: Feb 12, 1946  Admit date: 06/16/2020 Discharge date: 06/19/2020  PCP:  Charlane Ferretti   [place]  Cardiologist:  Sanda Klein, MD  Advanced Practice Provider:  No care team member to display Electrophysiologist:  None  [id number]}    Discharge Diagnoses    Principal Problem:   VT (ventricular tachycardia) storm [facility]) Active Problems:   Chronic HFrEF (heart failure with reduced ejection fraction) ([place])   Chronic kidney disease, stage III (moderate) ([place])   CAD (coronary artery disease), native coronary artery   Acute hypokalemia   Persistent atrial fibrillation ([place])    Diagnostic Studies/Procedures    ECHO: 06/17/2020 1. Left ventricular ejection fraction, by estimation, is 20 to 25%. The  left ventricle has severely decreased function. The left ventricle  demonstrates regional wall motion abnormalities (see scoring  diagram/findings for description). The left  ventricular internal cavity size was mildly dilated. Left ventricular  diastolic parameters are consistent with Grade II diastolic dysfunction  (pseudonormalization). Elevated left atrial pressure. There is scarring  and akinesis of the left ventricular,  entire anteroseptal wall, anterior wall and apical segment. There is  moderate hypokinesis of the left ventricular, entire inferoseptal wall,  inferior wall and inferolateral wall.  2. Right ventricular systolic function is mildly reduced. The right  ventricular size is normal. There is mildly elevated pulmonary artery  systolic pressure.  3. Left atrial size was severely dilated.  4. The mitral valve is normal in structure. Mild mitral valve  regurgitation.  5. The aortic valve is tricuspid. Aortic valve regurgitation is not  visualized. Mild aortic valve sclerosis is present, with no evidence of  aortic valve stenosis.  6. The inferior vena cava is  dilated in size with <50% respiratory  variability, suggesting right atrial pressure of 15 mmHg.   Comparison(s): No significant change from prior study. Prior images  reviewed side by side.  _____________   History of Present Illness     Charles Mcdonald is a 75 y.o. male with a longstanding history of ischemic cardiomyopathy, chronic combined systolic and diastolic heart failure with severely depressed left ventricular systolic function, extensive CAD, reportedly not amenable to surgical revascularization, status post Medtronic ICD, presenting 03/26 after multiple defibrillator shocks   Hospital Course     Consultants: None   Mr. Iacono was in his usual state of health this morning until approximately 11:30 AM.  He developed diaphoresis, but did not have chest pain, dyspnea or palpitations or syncope.  Just a few minutes later he received the first defibrillator shock.  He did not lose consciousness.  Over the next 2 hours while awaiting medical care and during his initial evaluation in the emergency room he received a total of 103 ICD shocks.  Thankfully he appeared to tolerate the triggering arrhythmia well and after placement of a magnet over his ICD, the shocks ceased.  He continued to have recurrent episodes of monomorphic ventricular tachycardia at about 224 bpm, often triggered by brief bursts of VT with similar QRS morphology.  There was no evidence of "long short" sequences or true torsades.  The arrhythmia began to subside after administration of intravenous amiodarone and intravenous metoprolol.  Subsequently, labs came back and the patient was found to be severely hypokalemic with a potassium of 2.1.  Although he has a lot of potassium supplement stores at home, he tells [name] that he was instructed to discontinue his potassium  several months ago.  He denies recent problems with worsening shortness of breath and has not had angina pectoris.  He denies fever, chills but he has had  a cough for about a couple of weeks.  Based on the electronic medical record he has not been evaluated by his EP cardiologist, Dr. Uvaldo Rising since July 2020 when he had a last outpatient visit.  However, based on device interrogation, his defibrillator was interrogated during a hospitalization 02/04/2020-02/29/2020.    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  His device had shocked him several times just before the device was interrogated on February 08, 2020.  Review of the discharge summary from that admission reports episodes of ventricular tachycardia ("self resolving", I suspect resolved with ICD shocks) as well as synchronized cardioversion for atrial fibrillation with RVR.  He was treated with intravenous amiodarone and intravenous pressors (at times on 3 different pressors) for shock.  He had endotracheal intubation mechanical ventilation for a long period of time.  He required temporary peg tube feeding.  Echocardiography during that admission showed EF 25-30%.  He was discharged to [facility] on December 08.  He underwent repeat surgical procedures on January 4 for his groin wounds. He received 6 defibrillator shocks in early January.  I am not sure that these shocks were acknowledged by the patient as the device was not interrogated.  It is possible that he was under anesthesia or sedated when they occurred.  He did develop heart failure exacerbation during that hospitalization and had a cardiology consultation by Dr. Doylene Canard on January 19.  He incidentally had COVID-19 infection during that hospital stay.  He has since returned home. He still has a PEG and colostomy LLQ.  He has relocated to [facility] and has not been able to use his remote transmitter, since he tells me that this requires a landline phone port.   1. VT storm:Had 103 shocks in under 2 hours. Secondary to severe  hypokalemia, although cannot exclude contribution of ischemia. VT is monomorphic, suggesting scar as the substrate, but the episodes are triggered by brief ventricular couplets. Doing much better on amiodarone, we will continue this as a chronic medication. Switched from IV to PO. D/c on 400 mg qd. Keep K+ > 4.0 and Mg > 2.0. 2. CHF:Pt in a protracted episode of mild systolic heart failure throughout the last several months.   Diuretics were resumed after we corrected his hypokalemia. Severe LV dysfunction and has clinical signs of hypervolemia, but not dyspneic. Note history of attempts at CRT that were unsuccessful.   Initially meds were held, but were gradually resumed. Continue metoprolol and losartan. He is currently off diuretics. Consider Entresto and/or spironolactone as OP if BP and renal function allow. Should NOT be given SGLT2 inhibitors with history of Fournier's gangrene. 3. [id number] and complicated history. Sounds like he does not have any great options for revascularization. As expected, the troponin is mildly elevated after so many defibrillator shocks. No evidence for ACS (other than the VT), no plan for coronary angiography. Continue ASA, metoprolol, statin. 4. History of persistent atrial fibrillation:Maintaining sinus rhythm on beta-blocker.  He was continued on apixaban. Reports no recent bleeding complications. Hemoglobin 11.8. 5. [id number] to clarify why his high-voltage lead is "under advisory"; do not have those details at this time.  RA and RV lead parameters all appear to be normal. After the numerous ICD shocks  that he received his current device longevity is only expected to be 15 months.  Note attempts at CRT upgrade unsuccessful due to repeated dislodgment of the coronary sinus passive lead. Consideration has been given to use of an epicardial lead or an active fixation coronary sinus lead. He has a Medtronic CRT-D device implanted, but with the LV port  pinned. 6. Hypokalemia:Aggressive replacement with intravenous supplements initially, then changed to p.o. Indeed, his list of home medications does not include a potassium supplement or spironolactone.  He was on Lasix 40 mg daily with no potassium supplementation prior to admission and also has GI losses through his colostomy. Will need a K supplement chronically, discharged on 40 mEq twice daily.  Repeat BMET tomorrow in the office. 7. CKD 3B:Baseline creatinine seems to be 1.7-2.0, he is in that range today. 8. History of Fournier's gangrene with extensive surgery and skin grafts in November-January:I do not think it is appropriate to restart his [place]. Since that time, he has a colostomy (note plans for possible reversal) and PEG (not using). PEG site not very healthy looking. Ostomy nurse consult obtained and the existing dressing was removed and the site was washed with soap and water.  A split gauze was placed.  He may need a barrier ring. 9. Elevated TSH: a little too high for "sick euthyroid sd".  However, free T4 was within normal limits.  Note likely future amio related issues.   Did the patient have an acute coronary syndrome (MI, NSTEMI, STEMI, etc) this admission?:  No                               Did the patient have a percutaneous coronary intervention (stent / angioplasty)?:  No.       _____________  Discharge Vitals Blood pressure 121/82, pulse (!) 105, temperature 98.3 F (36.8 C), temperature source Oral, resp. rate 16, height 6' (1.829 m), weight 83.9 kg, SpO2 94 %.  Filed Weights   06/17/20 0500 06/18/20 0444 06/19/20 0500  Weight: 83.1 kg 84.2 kg 83.9 kg    Labs & Radiologic Studies    CBC Lab Results  Component Value Date   WBC 24.6 (H) 06/16/2020   HGB 11.8 (L) 06/16/2020   HCT 37.6 (L) 06/16/2020   MCV 88.1 06/16/2020   PLT 443 (H) 123XX123    Basic Metabolic Panel Recent Labs    06/18/20 1034 06/18/20 1643 06/19/20 0302  NA  --  140 140  K   --  4.1 3.9  CL  --  103 105  CO2  --  29 25  GLUCOSE  --  118* 114*  BUN  --  20 20  CREATININE  --  1.92* 1.72*  CALCIUM  --  9.6 9.3  MG 2.0  --  2.0   Liver Function Tests Recent Labs    06/16/20 1637  AST 26  ALT 14  ALKPHOS 105  BILITOT 1.0  PROT 6.3*  ALBUMIN 3.1*    High Sensitivity Troponin:   Recent Labs  Lab 06/16/20 1637 06/16/20 1808  TROPONINIHS 1,214* 1,810*    BNP    Component Value Date/Time   BNP 3,412.9 (H) 04/11/2020 1903   Hemoglobin A1C Recent Labs    06/16/20 1637  HGBA1C 6.4*   Fasting Lipid Panel Recent Labs    06/17/20 0123  CHOL 123  HDL 28*  LDLCALC 75  TRIG 99  CHOLHDL 4.4  Thyroid Function Tests Recent Labs    06/16/20 1637  TSH 15.865*   Free T4  Date Value Ref Range Status  06/17/2020 0.98 0.61 - 1.12 ng/dL Final    Comment:    (NOTE) Biotin ingestion may interfere with free T4 tests. If the results are inconsistent with the TSH level, previous test results, or the clinical presentation, then consider biotin interference. If needed, order repeat testing after stopping biotin. Performed at [facility], [place] [address], [place], [place] [address]      _____________  DG Chest Port 1 View  Result Date: 06/16/2020 CLINICAL DATA:  Tachycardia.  ICD discharging. EXAM: PORTABLE CHEST 1 VIEW COMPARISON:  04/17/20. FINDINGS: Left chest wall ICD is noted with leads in the right atrial appendage and right ventricle. Stable mild cardiac enlargement. Interstitial and airspace opacities within the left midlung and left base is identified. No signs of pleural effusion. IMPRESSION: 1. Asymmetric interstitial and airspace opacities in the left midlung and left base concerning for pneumonia versus asymmetric edema 2. Mild cardiac enlargement. Electronically Signed   By: Kerby Moors M.D.   On: 06/16/2020 13:35   ECHOCARDIOGRAM COMPLETE  Result Date: 06/17/2020    ECHOCARDIOGRAM REPORT   Patient Name:   Charles Mcdonald  Date of Exam: 06/16/2020 Medical Rec #:  [id number]       Height:       72.0 in Accession #:    [id number]      Weight:       185.0 lb Date of Birth:  1946-03-19      BSA:          2.061 m Patient Age:    71 years        BP:           105/81 mmHg Patient Gender: M               HR:           85 bpm. Exam Location:  Inpatient Procedure: 2D Echo Indications:    acute systolic chf  History:        Patient has prior history of Echocardiogram examinations, most                 recent 04/12/2020. Defibrillator, chronic kidney disease;                 Arrythmias:Atrial Fibrillation and V-Tach.  Sonographer:    Johny Chess Referring Phys: [place]  1. Left ventricular ejection fraction, by estimation, is 20 to 25%. The left ventricle has severely decreased function. The left ventricle demonstrates regional wall motion abnormalities (see scoring diagram/findings for description). The left ventricular internal cavity size was mildly dilated. Left ventricular diastolic parameters are consistent with Grade II diastolic dysfunction (pseudonormalization). Elevated left atrial pressure. There is scarring and akinesis of the left ventricular, entire anteroseptal wall, anterior wall and apical segment. There is moderate hypokinesis of the left ventricular, entire inferoseptal wall, inferior wall and inferolateral wall.  2. Right ventricular systolic function is mildly reduced. The right ventricular size is normal. There is mildly elevated pulmonary artery systolic pressure.  3. Left atrial size was severely dilated.  4. The mitral valve is normal in structure. Mild mitral valve regurgitation.  5. The aortic valve is tricuspid. Aortic valve regurgitation is not visualized. Mild aortic valve sclerosis is present, with no evidence of aortic valve stenosis.  6. The inferior vena cava is dilated in size with <50%  respiratory variability, suggesting right atrial pressure of 15 mmHg. Comparison(s): No significant  change from prior study. Prior images reviewed side by side. FINDINGS  Left Ventricle: Left ventricular ejection fraction, by estimation, is 20 to 25%. The left ventricle has severely decreased function. The left ventricle demonstrates regional wall motion abnormalities. Scarring and akinesis of the left ventricular, entire anteroseptal wall, anterior wall and apical segment. Moderate hypokinesis of the left ventricular, entire inferoseptal wall, inferior wall and inferolateral wall. The left ventricular internal cavity size was mildly dilated. There is no left ventricular hypertrophy. Left ventricular diastolic parameters are consistent with Grade II diastolic dysfunction (pseudonormalization). Elevated left atrial pressure.  LV Wall Scoring: The mid and distal anterior wall, entire anterior septum, and entire apex are akinetic. The inferior wall, posterior wall, mid inferoseptal segment, and basal inferoseptal segment are hypokinetic. The antero-lateral wall and basal anterior segment are normal. Right Ventricle: The right ventricular size is normal. No increase in right ventricular wall thickness. Right ventricular systolic function is mildly reduced. There is mildly elevated pulmonary artery systolic pressure. The tricuspid regurgitant velocity  is 2.38 m/s, and with an assumed right atrial pressure of 15 mmHg, the estimated right ventricular systolic pressure is 0000000 mmHg. Left Atrium: Left atrial size was severely dilated. Right Atrium: Right atrial size was normal in size. Pericardium: There is no evidence of pericardial effusion. Mitral Valve: The mitral valve is normal in structure. Mild mitral valve regurgitation, with centrally-directed jet. Tricuspid Valve: The tricuspid valve is normal in structure. Tricuspid valve regurgitation is trivial. Aortic Valve: The aortic valve is tricuspid. Aortic valve regurgitation is not visualized. Mild aortic valve sclerosis is present, with no evidence of aortic valve  stenosis. Pulmonic Valve: The pulmonic valve was normal in structure. Pulmonic valve regurgitation is not visualized. Aorta: The aortic root and ascending aorta are structurally normal, with no evidence of dilitation. Venous: The inferior vena cava is dilated in size with less than 50% respiratory variability, suggesting right atrial pressure of 15 mmHg. IAS/Shunts: No atrial level shunt detected by color flow Doppler. Additional Comments: A device lead is visualized. There is a small pleural effusion in the right lateral region.  LEFT VENTRICLE PLAX 2D LVIDd:         5.90 cm  Diastology LVIDs:         5.30 cm  LV e' medial:    3.08 cm/s LV PW:         0.90 cm  LV E/e' medial:  25.5 LV IVS:        0.80 cm  LV e' lateral:   5.63 cm/s LVOT diam:     2.00 cm  LV E/e' lateral: 13.9 LV SV:         35 LV SV Index:   17 LVOT Area:     3.14 cm  RIGHT VENTRICLE            IVC RV S prime:     7.80 cm/s  IVC diam: 2.10 cm TAPSE (M-mode): 1.3 cm LEFT ATRIUM              Index       RIGHT ATRIUM           Index LA diam:        3.90 cm  1.89 cm/m  RA Area:     18.10 cm LA Vol (A2C):   105.0 ml 50.94 ml/m RA Volume:   51.70 ml  25.08 ml/m LA Vol (A4C):   90.3  ml  43.81 ml/m LA Biplane Vol: 100.0 ml 48.52 ml/m  AORTIC VALVE LVOT Vmax:   76.40 cm/s LVOT Vmean:  44.500 cm/s LVOT VTI:    0.112 m  AORTA Ao Root diam: 3.00 cm Ao Asc diam:  3.10 cm MITRAL VALVE               TRICUSPID VALVE MV Area (PHT): 4.89 cm    TR Peak grad:   22.7 mmHg MV Decel Time: 155 msec    TR Vmax:        238.00 cm/s MR Peak grad: 53.3 mmHg MR Mean grad: 34.0 mmHg    SHUNTS MR Vmax:      365.00 cm/s  Systemic VTI:  0.11 m MR Vmean:     272.0 cm/s   Systemic Diam: 2.00 cm MV E velocity: 78.40 cm/s MV A velocity: 52.70 cm/s MV E/A ratio:  1.49 Mihai Croitoru MD Electronically signed by Sanda Klein MD Signature Date/Time: 06/17/2020/12:22:40 PM    Final    Disposition   Pt is being discharged home today in good condition.  Follow-up Plans &  Appointments     Follow-up Information    Croitoru, Mihai, MD Follow up on 06/21/2020.   Specialty: Cardiology Why: Come to the office for lab work.  It is first come, first served.  You do not have to be fasting and you can take all your morning medications.  The lines are shorter the earlier you get there. Contact information: [address] [place] [address] [phone]        Sande Rives E, PA-C Follow up on 07/02/2020.   Specialties: Physician Assistant, Cardiology Why: Keep follow-up appointment with the PA who works with Dr. Sallyanne Kuster. Contact information: [address] [place] [address] [place] [address] [phone]              Discharge Instructions    (HEART FAILURE PATIENTS) Call MD:  Anytime you have any of the following symptoms: 1) 3 pound weight gain in 24 hours or 5 pounds in 1 week 2) shortness of breath, with or without a dry hacking cough 3) swelling in the hands, feet or stomach 4) if you have to sleep on extra pillows at night in order to breathe.   Complete by: As directed    Diet - low sodium heart healthy   Complete by: As directed    Diet Carb Modified   Complete by: As directed    Increase activity slowly   Complete by: As directed       Discharge Medications   Allergies as of 06/19/2020   No Known Allergies     Medication List    STOP taking these medications   dapagliflozin propanediol 10 MG Tabs tablet Commonly known as: FARXIGA   furosemide 40 MG tablet Commonly known as: LASIX   midodrine 10 MG tablet Commonly known as: PROAMATINE     TAKE these medications   allopurinol 100 MG tablet Commonly known as: ZYLOPRIM Take 100 mg by mouth daily.   amiodarone 200 MG tablet Commonly known as: PACERONE Take 2 tablets (400 mg total) by mouth daily. Start taking on: June 20, 2020   apixaban 5 MG Tabs tablet Commonly known as: ELIQUIS Take 5 mg by mouth every 12 (twelve) hours.   ascorbic acid 500 MG  tablet Commonly known as: VITAMIN C Take 500 mg by mouth daily.   aspirin EC 81 MG tablet Take 81 mg by mouth as directed. Swallow  whole.   atorvastatin 40 MG tablet Commonly known as: LIPITOR Take 40 mg by mouth at bedtime.   colchicine 0.6 MG tablet Take 0.6 mg by mouth See admin instructions. Take 0.6 mg by mouth as directed for gout flares   glucose blood test strip Accu-Chek Aviva Plus test strips   insulin lispro 100 UNIT/ML KwikPen Commonly known as: HUMALOG Inject 5 Units into the skin 2 (two) times daily before a meal.   Lantus SoloStar 100 UNIT/ML Solostar Pen Generic drug: insulin glargine Inject 5-10 Units into the skin See admin instructions. Inject 10 units into the skin before breakfast and 5 units at bedtime   losartan 25 MG tablet Commonly known as: COZAAR Take 1 tablet (25 mg total) by mouth daily. What changed:   medication strength  how much to take   metoprolol succinate 25 MG 24 hr tablet Commonly known as: TOPROL-XL Take 25 mg by mouth at bedtime.   nitroGLYCERIN 0.4 MG SL tablet Commonly known as: NITROSTAT Place 1 tablet (0.4 mg total) under the tongue every 5 (five) minutes x 3 doses as needed for chest pain.   pantoprazole 40 MG tablet Commonly known as: PROTONIX Take 40 mg by mouth at bedtime.   potassium chloride SA 20 MEQ tablet Commonly known as: KLOR-CON Take 2 tablets (40 mEq total) by mouth 2 (two) times daily.          Outstanding Labs/Studies   None  Duration of Discharge Encounter   Greater than 30 minutes including physician time.  Signed, Rosaria Ferries, PA-C 06/19/2020, 1:11 PM

## 2020-06-21 ENCOUNTER — Other Ambulatory Visit: Payer: Self-pay

## 2020-06-21 DIAGNOSIS — Z79899 Other long term (current) drug therapy: Secondary | ICD-10-CM

## 2020-06-21 LAB — BASIC METABOLIC PANEL
BUN/Creatinine Ratio: 13 (ref 10–24)
BUN: 24 mg/dL (ref 8–27)
CO2: 20 mmol/L (ref 20–29)
Calcium: 10.3 mg/dL — ABNORMAL HIGH (ref 8.6–10.2)
Chloride: 104 mmol/L (ref 96–106)
Creatinine, Ser: 1.83 mg/dL — ABNORMAL HIGH (ref 0.76–1.27)
Glucose: 124 mg/dL — ABNORMAL HIGH (ref 65–99)
Potassium: 5.3 mmol/L — ABNORMAL HIGH (ref 3.5–5.2)
Sodium: 144 mmol/L (ref 134–144)
eGFR: 39 mL/min/{1.73_m2} — ABNORMAL LOW (ref >59)

## 2020-06-21 NOTE — Progress Notes (Addendum)
Cardiology Office Note:    Date:  07/02/2020   ID:  Charles Mcdonald, DOB 1946/03/17, MRN [id number]  PCP:  Joline Salt, PA-C  Cardiologist:  Sanda Klein, MD  Electrophysiologist:  None   Referring MD: Joline Salt, PA-C   Chief Complaint: hospital follow-up for VT storm  History of Present Illness:    Charles Mcdonald is a 75 y.o. 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 Sallyanne Kuster and presents today for hospital follow-up for VT storm.   Patient has a long history of ischemic heart disease. He had an anterior MI in 2002 which was treated with a stent to the proximal LAD. EF at the time was 37%. Cardiac cath in 2004 showed occlusion of the proximal LAD, mid RCA, and mid LCX with collateral formation. He was "turned down for surgical revascularization" and "refused transplant." There was reportedly no evidence of viability (alhthough unclear what type of study was used to assess this). He had another NSTEMI in 2017. Coronary anatomy was essentially unchanged on cardiac catheterization and an unsuccessful attempt was made at a PCI of the LAD. Subsequent echocardiograms and nuclear stress tests have shown varying degrees of LV systolic dysfunction usually with EF around 25-30%. Initial ICD was implanted in 2006 for primary prevention. His high-voltage lead is reportedly "on advisory" (details unknown) and he had generator change out in 06/2018. At that last intervention, an attempt at upgrading his device to a biventricular pacemaker defibrillator was unsuccessful since the  passive LV lead repeatedly dislodged. Plan was to go ahead with an epicardial lead or an active fixation of LV lead at a future date but this did not happen. His current device is a [employer] systems engineer CRT-D device but the LV lead port is pinned and the device is programmed as a conventional dual chamber defibrillator. He also has a history of persistent atrial fibrillation and underwent cavotricuspid isthmus ablation in 04/2014. He is followed by Dr. Uvaldo Rising, EP at [facility]. However, he has not been seen by Dr. Uvaldo Rising since 09/2018.   Patient was hospitalized at [facility] in November/December 2021 with septic shock due to Fournier's gangrene and required repeat surgical debridement. His device was interrogated during this hospitalization and showed several shocks just before the device was interrogated. Discharge summary from that hospitalization reports episodes of VT that "self resolved" (Dr. Sallyanne Kuster suspect resolved with ICD shock) as well as synchronized cardioversion for atrial fibrillation with RVR. He was treated with IV Amiodarone and IV pressors (at times was on 3 different pressors) for shock. He required endotracheal intubation mechanical ventilation for a long period of time. He also required temporary peg tube feeding. Echo during admission showed LVEF of 25-30%. He was discharged to [facility] on 02/29/2020. He underwent repeat surgical procedures on 03/27/2020 for his groin wounds. He received 6 defibrillator shocks in early 03/2020. It was unclear whether these shocks were acknowledge by the patient as the device was not interrogated. It is possible that he was under anesthesia or sedated when they occurred. He was  noted to have an acute CHF exacerbation during that hospitalization. He required intubation again due to respiratory failure. Cardiology was consulted and he was seen by Dr. Doylene Canard. He ended up testing positive for COVID and was felt to have  secondary bacterial pneumonia. He was treated with antibiotics. He has since returned home. However, he still has PEG and colostomy in left lower quadrant.  He then relocated to [place]. He was readmitted to [facility] from 06/16/2020  to 06/19/2020 for VT storm after presenting with diaphoresis and multiple ICD shocks. He denied any chest pain, dyspnea palpitations, or syncope though. He never did lose consciousness. He received a total a 103 ICD shocks while awaiting medicare in the ED. Interrogation of device in the ED showed lower rate programmed to 60 bpm with 86% atrial sensed, ventricular sensed rhythm and only 14% atrial pacing and 0.2% ventricular pacing. Activity level had been very poor throughout the months of November to January when he was hospitalized and very low levels of activity (0.1 hours a day) since the beginning of February when he returned home. There had been no significant atrial fibrillation. Thoracic impedence had been out of range ever since he was initially hospitlaized in November and had never returned to baseline. He had over 100 episodes of VT over 2 hour span before presentation. A total of 55 of 61 episodes were pace terminated and another 73 of 74 episodes were shock terminated with a total of 103 shocks delivered. Multiple episodes of monophoric ventricular tachycardia over 200 bpm were treated as being in the VF zone usually with successful defibrillation with a single shock. ATP never intervened since the arrhythmia was too fast and ATP is turned off during charge in the VF zone.  VT storm felt to be secondary to severe hypokalemia with potassium of 2.1 on admission (although contribution of ischemia could not be excluded). Magnesium was 1.7. VT was monomorphic suggesting scare as the substrate but the episodes were triggered by brief ventricular couplets. High-sensitivity troponin elevated and peaked at 1,810 which was suspected given so many ICD shocks. Not felt to be  ACS. Potassium was repleted and he was started on IV Amiodarone and VT improved. Diuretics were resumed after hypokalemia was corrected for protracted episode of mild systolic CHF over the last several months. Echo during admission showed LVEF of 20-25% with akinesis of the anteroseptal wall, anterior wall, and apical segment as well as grade 2 diastolic dysfunction. Of note, TSH was markedly elevated at 15,865 but free T4 was normal. TSH was felt to be a little too high for sick euthyroid syndrome. Patient was discharged on Amiodarone '400mg'$  dialy, Toprol-XL '25mg'$  daily, Losartan '25mg'$  daily, Potassium chloride 40 mEq twice daily, Aspirin '81mg'$  daily, and Eliquis '5mg'$  twice daily. Will need to monitor TSH closely on Amiodarone. Close follow-up was arranged in clinic. Note, after numerous ICD shocks, device longevity is only expected to be 15 months. Also need to clarify why his high-voltage lead is "under advisory."  Patient presents today for follow-up. Here with wife. Patient denies any recurrent ICD shocks since discharge. He notes milt dizziness/lightheadedness if he bends over and then stands up quickly but no palpitations or syncope. No chest pain. He reports chronic shortness of breath but states it is no worse than usual. No orthopnea. He describes PND after first leaving the hospital but states this has settled down. He has chronic lower extremity edema that comes and goes and states this is stable. Weight is up about  17 lbs from discharge in the office. Patient states he weighs himself multiple times a week and states it has been stable. However, then upon further question, he states weight has been slowly coming up after he lost all that weight during his prolonged hospitalization earlier this year.   Past Medical History:  Diagnosis Date  . Acute on chronic respiratory failure with hypoxia ([place])   . Atrial fibrillation, chronic ([place])   . Benign hypertension   . CAD (coronary artery disease), native  coronary artery 06/19/2020  . Chronic HFrEF (heart failure with reduced ejection fraction) ([place])   . Chronic kidney disease, stage III (moderate) ([facility])   . Diabetes ([place])   . Gout   . Hyperlipidemia     Past Surgical History:  Procedure Laterality Date  . IR THORACENTESIS ASP PLEURAL SPACE W/IMG GUIDE  04/17/2020  . SKIN SPLIT GRAFT Bilateral 03/27/2020   Procedure: SKIN GRAFT SPLIT THICKNESS;  Surgeon: Cindra Presume, MD;  Location: [facility];  Service: Plastics;  Laterality: Bilateral;  . WOUND EXPLORATION Bilateral 03/27/2020   Procedure: Exploration bilateral groin wounds with partial closure;  Surgeon: Cindra Presume, MD;  Location: [facility];  Service: Plastics;  Laterality: Bilateral;    Current Medications: Current Meds  Medication Sig  . allopurinol (ZYLOPRIM) 100 MG tablet Take 100 mg by mouth daily.  Marland Kitchen amiodarone (PACERONE) 200 MG tablet Take 2 tablets (400 mg total) by mouth daily.  Marland Kitchen apixaban (ELIQUIS) 5 MG TABS tablet Take 5 mg by mouth every 12 (twelve) hours.  Marland Kitchen ascorbic acid (VITAMIN C) 500 MG tablet Take 500 mg by mouth daily.  Marland Kitchen aspirin EC 81 MG tablet Take 81 mg by mouth as directed. Swallow whole.  Marland Kitchen atorvastatin (LIPITOR) 40 MG tablet Take 40 mg by mouth at bedtime.  . colchicine 0.6 MG tablet Take 0.6 mg by mouth See admin instructions. Take 0.6 mg by mouth as directed for gout flares  . ergocalciferol (VITAMIN D2) 1.25 MG (50000 UT) capsule Vitamin D2 1,250 mcg (50,000 unit) capsule  1 po weekly  . glucose blood test strip Accu-Chek Aviva Plus test strips  . insulin lispro (HUMALOG) 100 UNIT/ML KwikPen Inject 5 Units into the skin 2 (two) times daily before a meal.  . LANTUS SOLOSTAR 100 UNIT/ML Solostar Pen Inject 5-10 Units into the skin See admin instructions. Inject 10 units into the skin before breakfast and 5 units at bedtime  . losartan (COZAAR) 25 MG tablet Take 1 tablet (25 mg total) by mouth daily.  . metoprolol succinate (TOPROL-XL) 25 MG 24 hr tablet Take 25  mg by mouth at bedtime.  . nitroGLYCERIN (NITROSTAT) 0.4 MG SL tablet Place 1 tablet (0.4 mg total) under the tongue every 5 (five) minutes x 3 doses as needed for chest pain.  . pantoprazole (PROTONIX) 40 MG tablet Take 40 mg by mouth at bedtime.  . potassium chloride SA (KLOR-CON) 20 MEQ tablet Take 1 tablet (20 mEq total) by mouth daily.     Allergies:   Patient has no known allergies.   Social History   Socioeconomic History  . Marital status: Married    Spouse name: Not on file  . Number of children: Not on file  . Years of education: Not on file  . Highest education level: Not on file  Occupational History  . Not on file  Tobacco Use  . Smoking status: Former Research scientist (life sciences)  . Smokeless tobacco: Never Used  Substance and Sexual Activity  . Alcohol use:  Not on file  . Drug use: Not on file  . Sexual activity: Not on file  Other Topics Concern  . Not on file  Social History Narrative  . Not on file   Social Determinants of Health   Financial Resource Strain: Not on file  Food Insecurity: Not on file  Transportation Needs: Not on file  Physical Activity: Not on file  Stress: Not on file  Social Connections: Not on file     Family History: The patient's family history is not on file.  ROS:   Please see the history of present illness.     EKGs/Labs/Other Studies Reviewed:    The following studies were reviewed today:  Echocardiogram 06/16/2020: Impressions: 1. Left ventricular ejection fraction, by estimation, is 20 to 25%. The  left ventricle has severely decreased function. The left ventricle  demonstrates regional wall motion abnormalities (see scoring  diagram/findings for description). The left  ventricular internal cavity size was mildly dilated. Left ventricular  diastolic parameters are consistent with Grade II diastolic dysfunction  (pseudonormalization). Elevated left atrial pressure. There is scarring  and akinesis of the left ventricular,  entire  anteroseptal wall, anterior wall and apical segment. There is  moderate hypokinesis of the left ventricular, entire inferoseptal wall,  inferior wall and inferolateral wall.  2. Right ventricular systolic function is mildly reduced. The right  ventricular size is normal. There is mildly elevated pulmonary artery  systolic pressure.  3. Left atrial size was severely dilated.  4. The mitral valve is normal in structure. Mild mitral valve  regurgitation.  5. The aortic valve is tricuspid. Aortic valve regurgitation is not  visualized. Mild aortic valve sclerosis is present, with no evidence of  aortic valve stenosis.  6. The inferior vena cava is dilated in size with <50% respiratory  variability, suggesting right atrial pressure of 15 mmHg.   Comparison(s): No significant change from prior study. Prior images  reviewed side by side.  EKG:  EKG ordered today. EKG personally reviewed and demonstrates normal sinus rhythm, rate 68 bpm, with chronic LBBB.  Recent Labs: 04/11/2020: B Natriuretic Peptide 3,412.9 06/16/2020: ALT 14; Hemoglobin 11.8; Platelets 443; TSH 15.865 06/19/2020: Magnesium 2.0 06/21/2020: BUN 24; Creatinine, Ser 1.83; Potassium 5.3; Sodium 144  Recent Lipid Panel    Component Value Date/Time   CHOL 123 06/17/2020 0123   TRIG 99 06/17/2020 0123   HDL 28 (L) 06/17/2020 0123   CHOLHDL 4.4 06/17/2020 0123   VLDL 20 06/17/2020 0123   LDLCALC 75 06/17/2020 0123    Physical Exam:    Vital Signs: BP 122/68   Pulse 68   Ht 6' (1.829 m)   Wt 201 lb 3.2 oz (91.3 kg)   BMI 27.29 kg/m     Wt Readings from Last 3 Encounters:  07/02/20 201 lb 3.2 oz (91.3 kg)  06/19/20 184 lb 15.5 oz (83.9 kg)  04/25/20 180 lb (81.6 kg)     General: 75 y.o. male in no acute distress. HEENT: Normocephalic and atraumatic. Sclera clear. Neck: Supple. No JVD. Heart: RRR. Distinct S1 and S2. Possible gallop. No significnat murmurs or rubs. Radial pulses 2+ and equal  bilaterally. Lungs: No increased work of breathing. Decreased breath sound in left base. No wheezes, rhonchi, or rales. Abdomen: Soft, non-distended, and non-tender to palpation. Bowel sounds present. Colostomy bag present. Extremities: 2-3+ pitting edema of bilateral lower extremities up to knees. Skin: Warm and dry. Neuro: Alert and oriented x3. No focal deficits. Psych: Normal affect. Responds  appropriately.  Assessment:    1. Ventricular tachycardia ([place])   2. Coronary artery disease involving native coronary artery of native heart without angina pectoris   3. Chronic systolic CHF (congestive heart failure) ([place])   4. Ischemic cardiomyopathy   5. Atrial fibrillation, chronic ([place])   6. Medication management   7. LBBB (left bundle branch block)   8. S/P ICD (internal cardiac defibrillator) procedure   9. Primary hypertension   10. Hyperlipidemia, unspecified hyperlipidemia type   11. Type 2 diabetes mellitus with complication, without long-term current use of insulin ([place])   12. Stage 3 chronic kidney disease, unspecified whether stage 3a or 3b CKD ([place])   13. Fournier's gangrene     Plan:    Recent VT Storm - Recently admitted for VT storm in the setting of hypokalemia (althoug cannot exclude contribution of ischemia). Had 103 ICD shocks in under 2 hours. VT was monomorphic, suggesting scar as the substrate, but the episodes were triggered by brief ventricular couplets.  - No recurrent ICD shocks since discharge. No palpitations. - Continue Amiodarone '400mg'$  daily. - Will check BMET, Magnesium, and TSH today.  CAD - History of anterior MI in 2002 s/p stenting to proximal LAD. Has noted been felt to have good revascularization options since then as noted above in HPI. - No angina. - Continue aspirin, beta-blocker, and high-intensity statin.  Chronic Systolic CHF Ischemic Cardiomyopathy - Echo during recent admission showed 20-25% with akinesis of the anteroseptal wall,  anterior wall, and apical segment as well as grade 2 diastolic dysfunction. - Patient has significant lower extremity edema on exam and decreased breath sounds in left base. Chest x-ray in the hospital showed asymmetric interstitial and airspace opacities in the left mid lung and left base concerning for pneumonia vs asymmetric edema. Patient has had no significant cough or fever to suggest pneumonia although WBC was elevated at 24.6 in the hospital. - Continue Losartan '25mg'$  daily. - Continue Toprol-XL '25mg'$  daily.  - No SGLT2 inhibitors due to history of Fournier's gangrene.  - Discussed importance of daily weights and sodium/fluid restrictions. Patient to notify us if he gains 3lbs in 1 day or 5lbs in 1 week. - Will check BNP and BMET today as well as CBC. Will likely add Lasix 20-'40mg'$  daily; however, I want to check creatinine and potassium first.  - Patient reports breathing is at his baseline. If this worsens, may need to repeat chest x-ray to reassess opacities in left lung.  Persistent Atrial Fibrillation - Maintaining sinus rhythm.  - Continue beta-blocker as above.  - Continue Eliquis '5mg'$  twice daily.  Chronic LBBB - Notes in Care Everywhere mention unsuccessful attempts at CRT upgrade due to repeated dislodgment of the coronary sinus passive lead. Consideration has been given to use use of an epicardial lead or an active fixation coronary sinus leads.   S/p Medtronic ICD - Patient has Medtronic CRT-D device with LV port pinned. Previously followed by Dr. Uvaldo Rising, EP at [facility]. Per notes in Care Everywhere, high-voltage lead is reportedly "on advisory" (details unknown). However, RA and RV lead parameters normal on recent interrogation. After numerous recent ICD shocks, device longevity only about 15 months.  - Have requested records from Dr. Tomasita Crumble (patient's Cardiologist at [facility]).  - Will send message to our [facility] to get patient enrolled  in our clinic now that he lives in [place] and will be following with [name].  Hypertension - BP well controlled. - Continue  medications for CHF as above.  Hyperlipidemia - Recent lipid panel from 06/17/2020: Total Cholesterol 123, Triglycerides 99, HDL 28, LDL 75. - Continue Lipitor '40mg'$  daily.  Diabetes Mellitus - Management per PCP.  CKD Stage III - Creatinine 1.83 on 06/21/2020. Baseline 1.5 to 1.8.  - Will repeat BMET today.  Fournier's Gangrene - S/p multiple interventions from November 2021 to January 2022.  Disposition: Follow up in 1-2 weeks with Dr. Sallyanne Kuster or APP.  ADDENDUM 07/03/2020 5:48PM: BNP elevated at 1,560. However, was in the 3,000's in January. Creatinine slightly elevated from discharge at 1.96. Given evidence of volume overload and reduced EF, will start Lasix '40mg'$  daily. Patient currently on potassium chloride 20 mEq. Will increase this to 30 mEq daily now that I am starting Lasix (only increasing slightly due to mild hyperkalemia on recent labs). Will recheck BMET in 5-7 days.   Discussed with Dr. Sallyanne Kuster - will continue Amiodarone '400mg'$  daily for 2 additional weeks and then transition to '200mg'$  daily. TSH also came back severely elevated at 19.4 (15.86 during recent admission). Will go ahead start Synthroid 61mg daily and have patient follow-up with PCP.  Will also refer patient to EP.   I did call and speak with MRenae Fickle Medtronic Rep, today about high voltage lead on advisory. He states he has a 69/49 lead which are more prone to fracture but as long as device checks are OK, it should be fine. He also mentioned that he has a BiV ICD but the LV lead was not showing. However, it is known that his LV lead port is pinned and the device is programmed as a conventional dual chamber defibrillator.  Medication Adjustments/Labs and Tests Ordered: Current medicines are reviewed at length with the patient today.  Concerns regarding medicines are outlined above.   Orders Placed This Encounter  Procedures  . Basic Metabolic Panel (BMET)  . B Nat Peptide  . CBC  . TSH  . Magnesium  . EKG 12-Lead   No orders of the defined types were placed in this encounter.   Patient Instructions  Medication Instructions:  Continue current medications  *If you need a refill on your cardiac medications before your next appointment, please call your pharmacy*   Lab Work: CBC, BNP, BMP, TSH. Magnesium  If you have labs (blood work) drawn today and your tests are completely normal, you will receive your results only by: .Marland KitchenMyChart Message (if you have MyChart) OR . A paper copy in the mail If you have any lab test that is abnormal or we need to change your treatment, we will call you to review the results.   Testing/Procedures: None Ordered   Follow-Up: [facility] you and your health needs are our priority.  As part of our continuing mission to provide you with exceptional heart care, we have created designated Provider Care Teams.  These Care Teams include your primary Cardiologist (physician) and Advanced Practice Providers (APPs -  Physician Assistants and Nurse Practitioners) who all work together to provide you with the care you need, when you need it.  We recommend signing up for the patient portal called "MyChart".  Sign up information is provided on this After Visit Summary.  MyChart is used to connect with patients for Virtual Visits (Telemedicine).  Patients are able to view lab/test results, encounter notes, upcoming appointments, etc.  Non-urgent messages can be sent to your provider as well.   To learn more about what you can do with MyChart, go  to [email or web address].    Your next appointment:   Thursday May 5th @ 2:15 pm  The format for your next appointment:   In Person  Provider:   You will see one of the following Advanced Practice Providers on your designated Care Team:    Sande Rives, PA-C             Signed, Darreld Mclean, Hershal Coria  07/02/2020 1:47 PM    [place]

## 2020-06-21 NOTE — Progress Notes (Signed)
Placed order for repeat BMET

## 2020-06-22 ENCOUNTER — Other Ambulatory Visit: Payer: Self-pay | Admitting: *Deleted

## 2020-06-22 DIAGNOSIS — Z79899 Other long term (current) drug therapy: Secondary | ICD-10-CM

## 2020-06-22 DIAGNOSIS — I482 Chronic atrial fibrillation, unspecified: Secondary | ICD-10-CM

## 2020-06-22 MED ORDER — POTASSIUM CHLORIDE CRYS ER 20 MEQ PO TBCR: 20.0000 meq | EXTENDED_RELEASE_TABLET | Freq: Every day | ORAL | 6 refills | Status: DC

## 2020-07-02 ENCOUNTER — Encounter: Payer: Self-pay | Admitting: Student

## 2020-07-02 ENCOUNTER — Other Ambulatory Visit: Payer: Self-pay

## 2020-07-02 ENCOUNTER — Ambulatory Visit: Payer: Medicare Other | Admitting: Student

## 2020-07-02 VITALS — BP 122/68 | HR 68 | Ht 72.0 in | Wt 201.2 lb

## 2020-07-02 DIAGNOSIS — I5022 Chronic systolic (congestive) heart failure: Secondary | ICD-10-CM | POA: Diagnosis not present

## 2020-07-02 DIAGNOSIS — I482 Chronic atrial fibrillation, unspecified: Secondary | ICD-10-CM

## 2020-07-02 DIAGNOSIS — E785 Hyperlipidemia, unspecified: Secondary | ICD-10-CM

## 2020-07-02 DIAGNOSIS — I447 Left bundle-branch block, unspecified: Secondary | ICD-10-CM

## 2020-07-02 DIAGNOSIS — I472 Ventricular tachycardia, unspecified: Secondary | ICD-10-CM

## 2020-07-02 DIAGNOSIS — N493 Fournier gangrene: Secondary | ICD-10-CM

## 2020-07-02 DIAGNOSIS — Z79899 Other long term (current) drug therapy: Secondary | ICD-10-CM

## 2020-07-02 DIAGNOSIS — I255 Ischemic cardiomyopathy: Secondary | ICD-10-CM

## 2020-07-02 DIAGNOSIS — Z95810 Presence of automatic (implantable) cardiac defibrillator: Secondary | ICD-10-CM

## 2020-07-02 DIAGNOSIS — I2510 Atherosclerotic heart disease of native coronary artery without angina pectoris: Secondary | ICD-10-CM | POA: Diagnosis not present

## 2020-07-02 DIAGNOSIS — I10 Essential (primary) hypertension: Secondary | ICD-10-CM

## 2020-07-02 DIAGNOSIS — E118 Type 2 diabetes mellitus with unspecified complications: Secondary | ICD-10-CM

## 2020-07-02 DIAGNOSIS — N183 Chronic kidney disease, stage 3 unspecified: Secondary | ICD-10-CM

## 2020-07-02 NOTE — Patient Instructions (Signed)
Medication Instructions:  Continue current medications  *If you need a refill on your cardiac medications before your next appointment, please call your pharmacy*   Lab Work: CBC, BNP, BMP, TSH. Magnesium  If you have labs (blood work) drawn today and your tests are completely normal, you will receive your results only by: Marland Kitchen MyChart Message (if you have MyChart) OR . A paper copy in the mail If you have any lab test that is abnormal or we need to change your treatment, we will call you to review the results.   Testing/Procedures: None Ordered   Follow-Up: At [facility], you and your health needs are our priority.  As part of our continuing mission to provide you with exceptional heart care, we have created designated Provider Care Teams.  These Care Teams include your primary Cardiologist (physician) and Advanced Practice Providers (APPs -  Physician Assistants and Nurse Practitioners) who all work together to provide you with the care you need, when you need it.  We recommend signing up for the patient portal called "MyChart".  Sign up information is provided on this After Visit Summary.  MyChart is used to connect with patients for Virtual Visits (Telemedicine).  Patients are able to view lab/test results, encounter notes, upcoming appointments, etc.  Non-urgent messages can be sent to your provider as well.   To learn more about what you can do with MyChart, go to [email or web address].    Your next appointment:   Thursday May 5th @ 2:15 pm  The format for your next appointment:   In Person  Provider:   You will see one of the following Advanced Practice Providers on your designated Care Team:    Sande Rives, [place]

## 2020-07-02 NOTE — Progress Notes (Signed)
Thanks, Callie. May be time to reduce the amiodarone dose soon. Has he had an appointment made with EP? Dr. Caryl Comes did briefly see him in ED when he had his shocks, even though he did not leave a note. Would be great to refer to him.

## 2020-07-03 LAB — BASIC METABOLIC PANEL
BUN/Creatinine Ratio: 13 (ref 10–24)
BUN: 26 mg/dL (ref 8–27)
CO2: 20 mmol/L (ref 20–29)
Calcium: 9.8 mg/dL (ref 8.6–10.2)
Chloride: 104 mmol/L (ref 96–106)
Creatinine, Ser: 1.96 mg/dL — ABNORMAL HIGH (ref 0.76–1.27)
Glucose: 99 mg/dL (ref 65–99)
Potassium: 4.7 mmol/L (ref 3.5–5.2)
Sodium: 142 mmol/L (ref 134–144)
eGFR: 36 mL/min/{1.73_m2} — ABNORMAL LOW (ref >59)

## 2020-07-03 LAB — MAGNESIUM: Magnesium: 2 mg/dL (ref 1.6–2.3)

## 2020-07-03 LAB — CBC
Hematocrit: 36.9 % — ABNORMAL LOW (ref 37.5–51.0)
Hemoglobin: 11.7 g/dL — ABNORMAL LOW (ref 13.0–17.7)
MCH: 27.1 pg (ref 26.6–33.0)
MCHC: 31.7 g/dL (ref 31.5–35.7)
MCV: 85 fL (ref 79–97)
Platelets: 398 10*3/uL (ref 150–450)
RBC: 4.32 x10E6/uL (ref 4.14–5.80)
RDW: 16.4 % — ABNORMAL HIGH (ref 11.6–15.4)
WBC: 9.7 10*3/uL (ref 3.4–10.8)

## 2020-07-03 LAB — BRAIN NATRIURETIC PEPTIDE: BNP: 1560.5 pg/mL — ABNORMAL HIGH (ref 0.0–100.0)

## 2020-07-03 LAB — TSH: TSH: 19.4 u[IU]/mL — ABNORMAL HIGH (ref 0.450–4.500)

## 2020-07-04 ENCOUNTER — Ambulatory Visit: Payer: Medicare Other | Admitting: Internal Medicine

## 2020-07-04 ENCOUNTER — Encounter: Payer: Self-pay | Admitting: Internal Medicine

## 2020-07-04 ENCOUNTER — Other Ambulatory Visit: Payer: Self-pay

## 2020-07-04 VITALS — BP 120/70 | HR 67 | Ht 72.0 in | Wt 201.0 lb

## 2020-07-04 DIAGNOSIS — Z433 Encounter for attention to colostomy: Secondary | ICD-10-CM | POA: Diagnosis not present

## 2020-07-04 NOTE — Patient Instructions (Signed)
I have referred you to the ostomy clinic.  I will call you back with an appointment.

## 2020-07-04 NOTE — Progress Notes (Signed)
HISTORY OF PRESENT ILLNESS:  Charles Mcdonald is a 75 y.o. male, retired Barrister's clerk, with multiple medical problems as listed below.  He is accompanied today by his wife.  He is not entirely clear who referred the patient to this office and for what reason.  The patient and his wife are not sure either.  He has received most of his health care in the [place] area health system.  He did moved to [place] for January of this year.  A considerable amount of time was spent looking at the electronic medical record and reviewing some outside notes including 1 from Percell Belt, PA-C dated April 27, 2020.  In that note problems #10 was listed as "colostomy present" with the plan "gastroenterologist referral".  Patient has a history of Fournier's gangrene for which she was hospitalized elsewhere and underwent diverting colostomy.  He has since recovered including skin grafts to the groin region for which she was followed by plastic surgery (Dr. Claudia Desanctis).  Patient tells me that he has had several colonoscopies in the [place] area.  No particular abnormalities noted.  Does tell me that his colostomy site has been a bit sore over the past 2 weeks.  He has not seen a general surgeon or ostomy nurse specialist.  His GI review of systems is otherwise negative.  REVIEW OF SYSTEMS:  All non-GI ROS negative unless otherwise stated in the HPI except for lower extremity edema, arthritis, sinus allergies  Past Medical History:  Diagnosis Date  . Acute on chronic respiratory failure with hypoxia ([place])   . Atrial fibrillation, chronic ([place])   . Benign hypertension   . CAD (coronary artery disease), native coronary artery 06/19/2020  . Chronic HFrEF (heart failure with reduced ejection fraction) ([place])   . Chronic kidney disease, stage III (moderate) (HCC)   . Diabetes ([place])   . Gout   . Hyperlipidemia     Past Surgical History:  Procedure Laterality Date  . IR THORACENTESIS ASP PLEURAL SPACE W/IMG GUIDE   04/17/2020  . SKIN SPLIT GRAFT Bilateral 03/27/2020   Procedure: SKIN GRAFT SPLIT THICKNESS;  Surgeon: Cindra Presume, MD;  Location: [facility];  Service: Plastics;  Laterality: Bilateral;  . WOUND EXPLORATION Bilateral 03/27/2020   Procedure: Exploration bilateral groin wounds with partial closure;  Surgeon: Cindra Presume, MD;  Location: [facility];  Service: Plastics;  Laterality: Bilateral;    Social History Charles Mcdonald  reports that he has quit smoking. He has never used smokeless tobacco. He reports that he does not drink alcohol and does not use drugs.  family history includes Diabetes in his sister; Heart disease in his father.  No Known Allergies     PHYSICAL EXAMINATION: Vital signs: BP 120/70   Pulse 67   Ht 6' (1.829 m)   Wt 201 lb (91.2 kg)   BMI 27.26 kg/m   Constitutional: Pleasant and somewhat unhealthy appearing, no acute distress Psychiatric: alert and oriented x3, cooperative Eyes: extraocular movements intact, anicteric, conjunctiva pink Mouth: Mask Neck: supple no lymphadenopathy Cardiovascular: heart regular rate and rhythm, no murmur Lungs: clear to auscultation bilaterally Abdomen: soft, nontender, slight tenderness around the ostomy without obvious abnormality, no obvious ascites, no peritoneal signs, normal bowel sounds, no organomegaly.  Formed brown stool in the colostomy bag Rectal: Omitted Extremities: no clubbing or cyanosis.  1+ lower extremity edema bilaterally Skin: no lesions on visible extremities Neuro: No focal deficits.  Cranial nerves intact  ASSESSMENT:  1.  History of Fournier's  gangrene status post diverting colostomy. 2.  2-week history of soreness around the colostomy site without obvious abnormality 3.  Negative GI review of systems 4.  Colonoscopies elsewhere 5.  Multiple significant medical problems and advanced age   PLAN:  1.  Referral to ostomy nurse for assessment and long-term management of his colostomy 2.  No additional GI  interventions are planned 3.  Return to the care of your primary providers A total time of 45 minutes was spent preparing to see the patient, reviewing outside tests and records.  Obtaining comprehensive history, performing medically appropriate physical examination, counseling patient and his wife regarding his above listed issues, making appropriate referral to ostomy specialist, and documenting clinical information in the health record

## 2020-07-08 ENCOUNTER — Inpatient Hospital Stay (HOSPITAL_COMMUNITY)
Admission: EM | Admit: 2020-07-08 | Discharge: 2020-07-12 | DRG: 291 | Disposition: A | Discharge status: Home / Self Care | Payer: Medicare Other | Attending: Internal Medicine | Admitting: Internal Medicine

## 2020-07-08 DIAGNOSIS — Z933 Colostomy status: Secondary | ICD-10-CM

## 2020-07-08 DIAGNOSIS — Z79899 Other long term (current) drug therapy: Secondary | ICD-10-CM

## 2020-07-08 DIAGNOSIS — Z8249 Family history of ischemic heart disease and other diseases of the circulatory system: Secondary | ICD-10-CM

## 2020-07-08 DIAGNOSIS — I4819 Other persistent atrial fibrillation: Secondary | ICD-10-CM | POA: Diagnosis present

## 2020-07-08 DIAGNOSIS — E872 Acidosis: Secondary | ICD-10-CM | POA: Diagnosis present

## 2020-07-08 DIAGNOSIS — I509 Heart failure, unspecified: Secondary | ICD-10-CM

## 2020-07-08 DIAGNOSIS — Z95810 Presence of automatic (implantable) cardiac defibrillator: Secondary | ICD-10-CM

## 2020-07-08 DIAGNOSIS — Z794 Long term (current) use of insulin: Secondary | ICD-10-CM

## 2020-07-08 DIAGNOSIS — G8929 Other chronic pain: Secondary | ICD-10-CM | POA: Diagnosis present

## 2020-07-08 DIAGNOSIS — I130 Hypertensive heart and chronic kidney disease with heart failure and stage 1 through stage 4 chronic kidney disease, or unspecified chronic kidney disease: Principal | ICD-10-CM | POA: Diagnosis present

## 2020-07-08 DIAGNOSIS — I255 Ischemic cardiomyopathy: Secondary | ICD-10-CM | POA: Diagnosis present

## 2020-07-08 DIAGNOSIS — M79606 Pain in leg, unspecified: Secondary | ICD-10-CM | POA: Diagnosis present

## 2020-07-08 DIAGNOSIS — Z20822 Contact with and (suspected) exposure to covid-19: Secondary | ICD-10-CM | POA: Diagnosis present

## 2020-07-08 DIAGNOSIS — J9601 Acute respiratory failure with hypoxia: Secondary | ICD-10-CM | POA: Diagnosis present

## 2020-07-08 DIAGNOSIS — R32 Unspecified urinary incontinence: Secondary | ICD-10-CM | POA: Diagnosis not present

## 2020-07-08 DIAGNOSIS — Z87891 Personal history of nicotine dependence: Secondary | ICD-10-CM

## 2020-07-08 DIAGNOSIS — H9190 Unspecified hearing loss, unspecified ear: Secondary | ICD-10-CM | POA: Diagnosis present

## 2020-07-08 DIAGNOSIS — R4182 Altered mental status, unspecified: Secondary | ICD-10-CM | POA: Diagnosis present

## 2020-07-08 DIAGNOSIS — N183 Chronic kidney disease, stage 3 unspecified: Secondary | ICD-10-CM | POA: Diagnosis present

## 2020-07-08 DIAGNOSIS — E785 Hyperlipidemia, unspecified: Secondary | ICD-10-CM | POA: Diagnosis present

## 2020-07-08 DIAGNOSIS — Z955 Presence of coronary angioplasty implant and graft: Secondary | ICD-10-CM

## 2020-07-08 DIAGNOSIS — I2510 Atherosclerotic heart disease of native coronary artery without angina pectoris: Secondary | ICD-10-CM | POA: Diagnosis present

## 2020-07-08 DIAGNOSIS — I447 Left bundle-branch block, unspecified: Secondary | ICD-10-CM | POA: Diagnosis present

## 2020-07-08 DIAGNOSIS — J80 Acute respiratory distress syndrome: Secondary | ICD-10-CM

## 2020-07-08 DIAGNOSIS — I259 Chronic ischemic heart disease, unspecified: Secondary | ICD-10-CM

## 2020-07-08 DIAGNOSIS — I959 Hypotension, unspecified: Secondary | ICD-10-CM | POA: Diagnosis not present

## 2020-07-08 DIAGNOSIS — I252 Old myocardial infarction: Secondary | ICD-10-CM

## 2020-07-08 DIAGNOSIS — I2584 Coronary atherosclerosis due to calcified coronary lesion: Secondary | ICD-10-CM | POA: Diagnosis present

## 2020-07-08 DIAGNOSIS — I5043 Acute on chronic combined systolic (congestive) and diastolic (congestive) heart failure: Secondary | ICD-10-CM | POA: Diagnosis present

## 2020-07-08 DIAGNOSIS — I5033 Acute on chronic diastolic (congestive) heart failure: Secondary | ICD-10-CM

## 2020-07-08 DIAGNOSIS — Z833 Family history of diabetes mellitus: Secondary | ICD-10-CM

## 2020-07-08 DIAGNOSIS — N179 Acute kidney failure, unspecified: Secondary | ICD-10-CM | POA: Diagnosis present

## 2020-07-08 DIAGNOSIS — I469 Cardiac arrest, cause unspecified: Secondary | ICD-10-CM | POA: Diagnosis present

## 2020-07-08 DIAGNOSIS — E039 Hypothyroidism, unspecified: Secondary | ICD-10-CM | POA: Diagnosis present

## 2020-07-08 DIAGNOSIS — Z7982 Long term (current) use of aspirin: Secondary | ICD-10-CM

## 2020-07-08 DIAGNOSIS — Z79890 Hormone replacement therapy (postmenopausal): Secondary | ICD-10-CM

## 2020-07-08 DIAGNOSIS — M109 Gout, unspecified: Secondary | ICD-10-CM | POA: Diagnosis present

## 2020-07-08 DIAGNOSIS — E1122 Type 2 diabetes mellitus with diabetic chronic kidney disease: Secondary | ICD-10-CM | POA: Diagnosis present

## 2020-07-08 DIAGNOSIS — Z7901 Long term (current) use of anticoagulants: Secondary | ICD-10-CM

## 2020-07-08 DIAGNOSIS — I240 Acute coronary thrombosis not resulting in myocardial infarction: Secondary | ICD-10-CM

## 2020-07-08 MED ORDER — PROPOFOL 1000 MG/100ML IV EMUL
INTRAVENOUS | Status: AC
  Administered 2020-07-09: 15 ug/kg/min via INTRAVENOUS
  Filled 2020-07-08: qty 100

## 2020-07-08 MED ORDER — PROPOFOL 1000 MG/100ML IV EMUL
INTRAVENOUS | Status: AC
  Filled 2020-07-08: qty 100

## 2020-07-08 MED ORDER — PROPOFOL 1000 MG/100ML IV EMUL: 5.0000 ug/kg/min | INTRAVENOUS | Status: DC

## 2020-07-08 NOTE — ED Notes (Signed)
Patient intubated at 2349. '100mg'$  rocuronium and '10mg'$  etomidate. 8.o ett tube, 25cm at teeth, bilateral breath sounds ascultated.

## 2020-07-08 NOTE — ED Triage Notes (Signed)
Pat to ED via EMS for complaints of possible STEMI. En route, patient became unresponsive, had rhythm change to v tach, and had to be bagged. Nitro sublingual x2 and 324 ASA PTA.

## 2020-07-09 ENCOUNTER — Encounter (HOSPITAL_COMMUNITY): Payer: Self-pay | Admitting: Pulmonary Disease

## 2020-07-09 ENCOUNTER — Emergency Department (HOSPITAL_COMMUNITY): Payer: Medicare Other

## 2020-07-09 ENCOUNTER — Ambulatory Visit (HOSPITAL_COMMUNITY): Payer: Medicare Other

## 2020-07-09 ENCOUNTER — Inpatient Hospital Stay (HOSPITAL_COMMUNITY): Payer: Medicare Other

## 2020-07-09 ENCOUNTER — Other Ambulatory Visit: Payer: Self-pay

## 2020-07-09 DIAGNOSIS — G8929 Other chronic pain: Secondary | ICD-10-CM | POA: Diagnosis present

## 2020-07-09 DIAGNOSIS — I509 Heart failure, unspecified: Secondary | ICD-10-CM | POA: Diagnosis not present

## 2020-07-09 DIAGNOSIS — H9190 Unspecified hearing loss, unspecified ear: Secondary | ICD-10-CM | POA: Diagnosis present

## 2020-07-09 DIAGNOSIS — R32 Unspecified urinary incontinence: Secondary | ICD-10-CM | POA: Diagnosis not present

## 2020-07-09 DIAGNOSIS — I4819 Other persistent atrial fibrillation: Secondary | ICD-10-CM | POA: Diagnosis present

## 2020-07-09 DIAGNOSIS — I255 Ischemic cardiomyopathy: Secondary | ICD-10-CM | POA: Diagnosis present

## 2020-07-09 DIAGNOSIS — I447 Left bundle-branch block, unspecified: Secondary | ICD-10-CM | POA: Diagnosis present

## 2020-07-09 DIAGNOSIS — J9601 Acute respiratory failure with hypoxia: Secondary | ICD-10-CM | POA: Diagnosis present

## 2020-07-09 DIAGNOSIS — Z20822 Contact with and (suspected) exposure to covid-19: Secondary | ICD-10-CM | POA: Diagnosis present

## 2020-07-09 DIAGNOSIS — I5043 Acute on chronic combined systolic (congestive) and diastolic (congestive) heart failure: Secondary | ICD-10-CM

## 2020-07-09 DIAGNOSIS — J80 Acute respiratory distress syndrome: Secondary | ICD-10-CM | POA: Diagnosis not present

## 2020-07-09 DIAGNOSIS — Z95810 Presence of automatic (implantable) cardiac defibrillator: Secondary | ICD-10-CM | POA: Diagnosis not present

## 2020-07-09 DIAGNOSIS — I130 Hypertensive heart and chronic kidney disease with heart failure and stage 1 through stage 4 chronic kidney disease, or unspecified chronic kidney disease: Secondary | ICD-10-CM | POA: Diagnosis present

## 2020-07-09 DIAGNOSIS — E785 Hyperlipidemia, unspecified: Secondary | ICD-10-CM | POA: Diagnosis present

## 2020-07-09 DIAGNOSIS — I5021 Acute systolic (congestive) heart failure: Secondary | ICD-10-CM

## 2020-07-09 DIAGNOSIS — M79606 Pain in leg, unspecified: Secondary | ICD-10-CM | POA: Diagnosis present

## 2020-07-09 DIAGNOSIS — I240 Acute coronary thrombosis not resulting in myocardial infarction: Secondary | ICD-10-CM

## 2020-07-09 DIAGNOSIS — I469 Cardiac arrest, cause unspecified: Secondary | ICD-10-CM | POA: Diagnosis present

## 2020-07-09 DIAGNOSIS — I472 Ventricular tachycardia: Secondary | ICD-10-CM | POA: Diagnosis not present

## 2020-07-09 DIAGNOSIS — I959 Hypotension, unspecified: Secondary | ICD-10-CM | POA: Diagnosis not present

## 2020-07-09 DIAGNOSIS — I259 Chronic ischemic heart disease, unspecified: Secondary | ICD-10-CM | POA: Diagnosis not present

## 2020-07-09 DIAGNOSIS — R4182 Altered mental status, unspecified: Secondary | ICD-10-CM | POA: Diagnosis present

## 2020-07-09 DIAGNOSIS — M109 Gout, unspecified: Secondary | ICD-10-CM | POA: Diagnosis present

## 2020-07-09 DIAGNOSIS — N179 Acute kidney failure, unspecified: Secondary | ICD-10-CM | POA: Diagnosis present

## 2020-07-09 DIAGNOSIS — I2584 Coronary atherosclerosis due to calcified coronary lesion: Secondary | ICD-10-CM | POA: Diagnosis present

## 2020-07-09 DIAGNOSIS — N183 Chronic kidney disease, stage 3 unspecified: Secondary | ICD-10-CM | POA: Diagnosis present

## 2020-07-09 DIAGNOSIS — E872 Acidosis: Secondary | ICD-10-CM | POA: Diagnosis present

## 2020-07-09 DIAGNOSIS — E1122 Type 2 diabetes mellitus with diabetic chronic kidney disease: Secondary | ICD-10-CM | POA: Diagnosis present

## 2020-07-09 DIAGNOSIS — I2510 Atherosclerotic heart disease of native coronary artery without angina pectoris: Secondary | ICD-10-CM | POA: Diagnosis present

## 2020-07-09 DIAGNOSIS — E039 Hypothyroidism, unspecified: Secondary | ICD-10-CM | POA: Diagnosis present

## 2020-07-09 LAB — TROPONIN I (HIGH SENSITIVITY)
Troponin I (High Sensitivity): 26 ng/L — ABNORMAL HIGH (ref <18)
Troponin I (High Sensitivity): 47 ng/L — ABNORMAL HIGH (ref <18)

## 2020-07-09 LAB — ECHOCARDIOGRAM LIMITED
Area-P 1/2: 5.02 cm2
Height: 72 in
S' Lateral: 5 cm
Weight: 3199.32 oz

## 2020-07-09 LAB — CBC
HCT: 36.7 % — ABNORMAL LOW (ref 39.0–52.0)
HCT: 40.3 % (ref 39.0–52.0)
Hemoglobin: 11.4 g/dL — ABNORMAL LOW (ref 13.0–17.0)
Hemoglobin: 12.3 g/dL — ABNORMAL LOW (ref 13.0–17.0)
MCH: 27.8 pg (ref 26.0–34.0)
MCH: 28.2 pg (ref 26.0–34.0)
MCHC: 30.5 g/dL (ref 30.0–36.0)
MCHC: 31.1 g/dL (ref 30.0–36.0)
MCV: 89.5 fL (ref 80.0–100.0)
MCV: 92.4 fL (ref 80.0–100.0)
Platelets: 339 10*3/uL (ref 150–400)
Platelets: 431 10*3/uL — ABNORMAL HIGH (ref 150–400)
RBC: 4.1 MIL/uL — ABNORMAL LOW (ref 4.22–5.81)
RBC: 4.36 MIL/uL (ref 4.22–5.81)
RDW: 17.5 % — ABNORMAL HIGH (ref 11.5–15.5)
RDW: 17.7 % — ABNORMAL HIGH (ref 11.5–15.5)
WBC: 13.9 10*3/uL — ABNORMAL HIGH (ref 4.0–10.5)
WBC: 14.6 10*3/uL — ABNORMAL HIGH (ref 4.0–10.5)
nRBC: 0 % (ref 0.0–0.2)
nRBC: 0 % (ref 0.0–0.2)

## 2020-07-09 LAB — BASIC METABOLIC PANEL
Anion gap: 14 (ref 5–15)
Anion gap: 12 (ref 5–15)
BUN: 24 mg/dL — ABNORMAL HIGH (ref 8–23)
BUN: 23 mg/dL (ref 8–23)
CO2: 21 mmol/L — ABNORMAL LOW (ref 22–32)
CO2: 25 mmol/L (ref 22–32)
Calcium: 9.7 mg/dL (ref 8.9–10.3)
Calcium: 9.7 mg/dL (ref 8.9–10.3)
Chloride: 108 mmol/L (ref 98–111)
Chloride: 106 mmol/L (ref 98–111)
Creatinine, Ser: 2.1 mg/dL — ABNORMAL HIGH (ref 0.61–1.24)
Creatinine, Ser: 1.98 mg/dL — ABNORMAL HIGH (ref 0.61–1.24)
GFR, Estimated: 33 mL/min — ABNORMAL LOW (ref >60)
GFR, Estimated: 35 mL/min — ABNORMAL LOW (ref >60)
Glucose, Bld: 151 mg/dL — ABNORMAL HIGH (ref 70–99)
Glucose, Bld: 105 mg/dL — ABNORMAL HIGH (ref 70–99)
Potassium: 4.6 mmol/L (ref 3.5–5.1)
Potassium: 4.2 mmol/L (ref 3.5–5.1)
Sodium: 143 mmol/L (ref 135–145)
Sodium: 143 mmol/L (ref 135–145)

## 2020-07-09 LAB — COMPREHENSIVE METABOLIC PANEL
ALT: 16 U/L (ref 0–44)
AST: 21 U/L (ref 15–41)
Albumin: 3.4 g/dL — ABNORMAL LOW (ref 3.5–5.0)
Alkaline Phosphatase: 124 U/L (ref 38–126)
Anion gap: 11 (ref 5–15)
BUN: 24 mg/dL — ABNORMAL HIGH (ref 8–23)
CO2: 20 mmol/L — ABNORMAL LOW (ref 22–32)
Calcium: 9.4 mg/dL (ref 8.9–10.3)
Chloride: 111 mmol/L (ref 98–111)
Creatinine, Ser: 2.14 mg/dL — ABNORMAL HIGH (ref 0.61–1.24)
GFR, Estimated: 32 mL/min — ABNORMAL LOW (ref >60)
Glucose, Bld: 220 mg/dL — ABNORMAL HIGH (ref 70–99)
Potassium: 4.2 mmol/L (ref 3.5–5.1)
Sodium: 142 mmol/L (ref 135–145)
Total Bilirubin: 0.4 mg/dL (ref 0.3–1.2)
Total Protein: 6.8 g/dL (ref 6.5–8.1)

## 2020-07-09 LAB — HEPARIN LEVEL (UNFRACTIONATED): Heparin Unfractionated: >1.1 IU/mL — ABNORMAL HIGH (ref 0.30–0.70)

## 2020-07-09 LAB — I-STAT VENOUS BLOOD GAS, ED
Acid-base deficit: 8 mmol/L — ABNORMAL HIGH (ref 0.0–2.0)
Bicarbonate: 20 mmol/L (ref 20.0–28.0)
Calcium, Ion: 1.13 mmol/L — ABNORMAL LOW (ref 1.15–1.40)
HCT: 31 % — ABNORMAL LOW (ref 39.0–52.0)
Hemoglobin: 10.5 g/dL — ABNORMAL LOW (ref 13.0–17.0)
O2 Saturation: 69 %
Potassium: 3.8 mmol/L (ref 3.5–5.1)
Sodium: 143 mmol/L (ref 135–145)
TCO2: 22 mmol/L (ref 22–32)
pCO2, Ven: 51.2 mmHg (ref 44.0–60.0)
pH, Ven: 7.199 — CL (ref 7.250–7.430)
pO2, Ven: 44 mmHg (ref 32.0–45.0)

## 2020-07-09 LAB — PHOSPHORUS: Phosphorus: 4.2 mg/dL (ref 2.5–4.6)

## 2020-07-09 LAB — I-STAT ARTERIAL BLOOD GAS, ED
Acid-base deficit: 1 mmol/L (ref 0.0–2.0)
Acid-base deficit: 4 mmol/L — ABNORMAL HIGH (ref 0.0–2.0)
Bicarbonate: 21.9 mmol/L (ref 20.0–28.0)
Bicarbonate: 23.8 mmol/L (ref 20.0–28.0)
Calcium, Ion: 1.31 mmol/L (ref 1.15–1.40)
Calcium, Ion: 1.31 mmol/L (ref 1.15–1.40)
HCT: 32 % — ABNORMAL LOW (ref 39.0–52.0)
HCT: 32 % — ABNORMAL LOW (ref 39.0–52.0)
Hemoglobin: 10.9 g/dL — ABNORMAL LOW (ref 13.0–17.0)
Hemoglobin: 10.9 g/dL — ABNORMAL LOW (ref 13.0–17.0)
O2 Saturation: 94 %
O2 Saturation: 98 %
Patient temperature: 98.6
Patient temperature: 98.6
Potassium: 3.7 mmol/L (ref 3.5–5.1)
Potassium: 4.1 mmol/L (ref 3.5–5.1)
Sodium: 141 mmol/L (ref 135–145)
Sodium: 143 mmol/L (ref 135–145)
TCO2: 23 mmol/L (ref 22–32)
TCO2: 25 mmol/L (ref 22–32)
pCO2 arterial: 39.1 mmHg (ref 32.0–48.0)
pCO2 arterial: 43.2 mmHg (ref 32.0–48.0)
pH, Arterial: 7.312 — ABNORMAL LOW (ref 7.350–7.450)
pH, Arterial: 7.392 (ref 7.350–7.450)
pO2, Arterial: 115 mmHg — ABNORMAL HIGH (ref 83.0–108.0)
pO2, Arterial: 71 mmHg — ABNORMAL LOW (ref 83.0–108.0)

## 2020-07-09 LAB — MAGNESIUM: Magnesium: 1.9 mg/dL (ref 1.7–2.4)

## 2020-07-09 LAB — PROCALCITONIN: Procalcitonin: 0.46 ng/mL

## 2020-07-09 LAB — BRAIN NATRIURETIC PEPTIDE: B Natriuretic Peptide: 2369.9 pg/mL — ABNORMAL HIGH (ref 0.0–100.0)

## 2020-07-09 LAB — GLUCOSE, CAPILLARY
Glucose-Capillary: 125 mg/dL — ABNORMAL HIGH (ref 70–99)
Glucose-Capillary: 125 mg/dL — ABNORMAL HIGH (ref 70–99)
Glucose-Capillary: 100 mg/dL — ABNORMAL HIGH (ref 70–99)
Glucose-Capillary: 94 mg/dL (ref 70–99)
Glucose-Capillary: 137 mg/dL — ABNORMAL HIGH (ref 70–99)

## 2020-07-09 LAB — PROTIME-INR
INR: 1.5 — ABNORMAL HIGH (ref 0.8–1.2)
Prothrombin Time: 17.9 seconds — ABNORMAL HIGH (ref 11.4–15.2)

## 2020-07-09 LAB — LACTIC ACID, PLASMA
Lactic Acid, Venous: 4.3 mmol/L (ref 0.5–1.9)
Lactic Acid, Venous: 2.9 mmol/L (ref 0.5–1.9)

## 2020-07-09 LAB — RESP PANEL BY RT-PCR (FLU A&B, COVID) ARPGX2
Influenza A by PCR: NEGATIVE
Influenza B by PCR: NEGATIVE
SARS Coronavirus 2 by RT PCR: NEGATIVE

## 2020-07-09 LAB — T4, FREE: Free T4: 0.85 ng/dL (ref 0.61–1.12)

## 2020-07-09 LAB — APTT: aPTT: 39 seconds — ABNORMAL HIGH (ref 24–36)

## 2020-07-09 LAB — MRSA PCR SCREENING: MRSA by PCR: NEGATIVE

## 2020-07-09 LAB — TSH: TSH: 17.355 u[IU]/mL — ABNORMAL HIGH (ref 0.350–4.500)

## 2020-07-09 MED ORDER — FENTANYL 2500MCG IN NS 250ML (10MCG/ML) PREMIX INFUSION
0.0000 ug/h | INTRAVENOUS | Status: DC
  Administered 2020-07-09: 25 ug/h via INTRAVENOUS
  Filled 2020-07-09: qty 250

## 2020-07-09 MED ORDER — DOCUSATE SODIUM 50 MG/5ML PO LIQD
100.0000 mg | Freq: Two times a day (BID) | ORAL | Status: DC | PRN
  Filled 2020-07-09: qty 10

## 2020-07-09 MED ORDER — HEPARIN (PORCINE) 25000 UT/250ML-% IV SOLN: 1450.0000 [IU]/h | INTRAVENOUS | Status: DC

## 2020-07-09 MED ORDER — ONDANSETRON HCL 4 MG/2ML IJ SOLN
4.0000 mg | Freq: Four times a day (QID) | INTRAMUSCULAR | Status: DC | PRN
Start: 2020-07-09 — End: 2020-07-12

## 2020-07-09 MED ORDER — POLYETHYLENE GLYCOL 3350 17 G PO PACK: 17.0000 g | PACK | Freq: Every day | ORAL | Status: DC

## 2020-07-09 MED ORDER — ORAL CARE MOUTH RINSE
15.0000 mL | OROMUCOSAL | Status: DC
  Administered 2020-07-09 (×2): 15 mL via OROMUCOSAL

## 2020-07-09 MED ORDER — IPRATROPIUM-ALBUTEROL 0.5-2.5 (3) MG/3ML IN SOLN: 3.0000 mL | Freq: Four times a day (QID) | RESPIRATORY_TRACT | Status: DC | PRN

## 2020-07-09 MED ORDER — DOCUSATE SODIUM 50 MG/5ML PO LIQD: 100.0000 mg | Freq: Two times a day (BID) | ORAL | Status: DC

## 2020-07-09 MED ORDER — AMIODARONE HCL 200 MG PO TABS
200.0000 mg | ORAL_TABLET | Freq: Every day | ORAL | Status: DC
  Administered 2020-07-09: 200 mg
  Filled 2020-07-09: qty 1

## 2020-07-09 MED ORDER — HEPARIN (PORCINE) 25000 UT/250ML-% IV SOLN
1550.0000 [IU]/h | INTRAVENOUS | Status: DC
  Administered 2020-07-09: 1450 [IU]/h via INTRAVENOUS
  Administered 2020-07-10: 1700 [IU]/h via INTRAVENOUS
  Filled 2020-07-09 (×2): qty 250

## 2020-07-09 MED ORDER — CHLORHEXIDINE GLUCONATE 0.12% ORAL RINSE (MEDLINE KIT)
15.0000 mL | Freq: Two times a day (BID) | OROMUCOSAL | Status: DC
  Administered 2020-07-09: 15 mL via OROMUCOSAL

## 2020-07-09 MED ORDER — INSULIN ASPART 100 UNIT/ML ~~LOC~~ SOLN
0.0000 [IU] | SUBCUTANEOUS | Status: DC
  Administered 2020-07-09 – 2020-07-10 (×2): 3 [IU] via SUBCUTANEOUS

## 2020-07-09 MED ORDER — APIXABAN 5 MG PO TABS: 5.0000 mg | ORAL_TABLET | Freq: Two times a day (BID) | ORAL | Status: DC

## 2020-07-09 MED ORDER — FENTANYL CITRATE (PF) 100 MCG/2ML IJ SOLN
25.0000 ug | Freq: Once | INTRAMUSCULAR | Status: AC
Start: 2020-07-09 — End: 2020-07-09
  Administered 2020-07-09: 25 ug via INTRAVENOUS

## 2020-07-09 MED ORDER — POLYETHYLENE GLYCOL 3350 17 G PO PACK: 17.0000 g | PACK | Freq: Every day | ORAL | Status: DC | PRN

## 2020-07-09 MED ORDER — DOCUSATE SODIUM 100 MG PO CAPS: 100.0000 mg | ORAL_CAPSULE | Freq: Two times a day (BID) | ORAL | Status: DC | PRN

## 2020-07-09 MED ORDER — PANTOPRAZOLE SODIUM 40 MG IV SOLR
40.0000 mg | Freq: Every day | INTRAVENOUS | Status: DC
  Administered 2020-07-09 – 2020-07-11 (×3): 40 mg via INTRAVENOUS
  Filled 2020-07-09 (×3): qty 40

## 2020-07-09 MED ORDER — NOREPINEPHRINE 4 MG/250ML-% IV SOLN
INTRAVENOUS | Status: AC
  Administered 2020-07-09: 4 mg
  Filled 2020-07-09: qty 250

## 2020-07-09 MED ORDER — FENTANYL BOLUS VIA INFUSION
25.0000 ug | INTRAVENOUS | Status: DC | PRN
  Administered 2020-07-09: 25 ug via INTRAVENOUS
  Filled 2020-07-09: qty 100

## 2020-07-09 MED ORDER — PERFLUTREN LIPID MICROSPHERE
INTRAVENOUS | Status: AC
  Administered 2020-07-09: 3 mL via INTRAVENOUS
  Filled 2020-07-09: qty 10

## 2020-07-09 MED ORDER — NOREPINEPHRINE 4 MG/250ML-% IV SOLN
2.0000 ug/min | INTRAVENOUS | Status: DC
  Administered 2020-07-09: 4 ug/min via INTRAVENOUS

## 2020-07-09 MED ORDER — FUROSEMIDE 10 MG/ML IJ SOLN: 40.0000 mg | Freq: Once | INTRAMUSCULAR | Status: DC

## 2020-07-09 MED ORDER — FUROSEMIDE 10 MG/ML IJ SOLN
40.0000 mg | Freq: Three times a day (TID) | INTRAMUSCULAR | Status: DC
  Administered 2020-07-09 – 2020-07-10 (×3): 40 mg via INTRAVENOUS
  Filled 2020-07-09 (×3): qty 4

## 2020-07-09 MED ORDER — ATORVASTATIN CALCIUM 40 MG PO TABS: 40.0000 mg | ORAL_TABLET | Freq: Every day | ORAL | Status: DC

## 2020-07-09 MED ORDER — ASPIRIN 81 MG PO CHEW
81.0000 mg | CHEWABLE_TABLET | Freq: Every day | ORAL | Status: DC
  Administered 2020-07-09: 81 mg
  Filled 2020-07-09: qty 1

## 2020-07-09 MED ORDER — ATORVASTATIN CALCIUM 40 MG PO TABS
40.0000 mg | ORAL_TABLET | Freq: Every day | ORAL | Status: DC
  Administered 2020-07-09: 40 mg
  Filled 2020-07-09: qty 1

## 2020-07-09 MED ORDER — CHLORHEXIDINE GLUCONATE CLOTH 2 % EX PADS
6.0000 | MEDICATED_PAD | Freq: Every day | CUTANEOUS | Status: DC
  Administered 2020-07-09 – 2020-07-12 (×4): 6 via TOPICAL

## 2020-07-09 MED ORDER — PERFLUTREN LIPID MICROSPHERE
1.0000 mL | INTRAVENOUS | Status: AC | PRN
  Filled 2020-07-09: qty 10

## 2020-07-09 MED ORDER — FUROSEMIDE 10 MG/ML IJ SOLN
40.0000 mg | Freq: Once | INTRAMUSCULAR | Status: AC
  Administered 2020-07-09: 40 mg via INTRAVENOUS
  Filled 2020-07-09: qty 4

## 2020-07-09 MED ORDER — SODIUM CHLORIDE 0.9 % IV SOLN
250.0000 mL | INTRAVENOUS | Status: DC
  Administered 2020-07-09: 250 mL via INTRAVENOUS

## 2020-07-09 MED ORDER — AMIODARONE HCL 200 MG PO TABS: 200.0000 mg | ORAL_TABLET | Freq: Every day | ORAL | Status: DC

## 2020-07-09 NOTE — Progress Notes (Signed)
ANTICOAGULATION CONSULT NOTE - Follow Up Consult  Pharmacy Consult for heparin Indication: atrial fibrillation  No Known Allergies  Patient Measurements: Height: 6' (182.9 cm) Weight: 90.7 kg (199 lb 15.3 oz) IBW/kg (Calculated) : 77.6 Heparin Dosing Weight: 91.2 kg  Vital Signs: Temp: 98 F (36.7 C) (04/18 1508) Temp Source: Oral (04/18 1508) BP: 89/55 (04/18 1800) Pulse Rate: 68 (04/18 1800)  Labs: Recent Labs    07/08/20 2356 07/09/20 0030 07/09/20 0046 07/09/20 0156 07/09/20 0320 07/09/20 0437 07/09/20 0736 07/09/20 1436 07/09/20 1724  HGB 12.3*   < > 10.9*  --  10.9*  --  11.4*  --   --   HCT 40.3   < > 32.0*  --  32.0*  --  36.7*  --   --   PLT 431*  --   --   --   --   --  339  --   --   APTT  --   --   --   --   --   --   --   --  39*  LABPROT  --   --   --   --   --  17.9*  --   --   --   INR  --   --   --   --   --  1.5*  --   --   --   HEPARINUNFRC  --   --   --   --   --   --   --   --  >1.10*  CREATININE 2.14*  --   --   --   --   --  2.10* 1.98*  --   TROPONINIHS 26*  --   --  47*  --   --   --   --   --    < > = values in this interval not displayed.    Estimated Creatinine Clearance: 37 mL/min (A) (by C-G formula based on SCr of 1.98 mg/dL (H)).   Medical History: Past Medical History:  Diagnosis Date  . Acute on chronic respiratory failure with hypoxia ([place])   . Atrial fibrillation, chronic ([place])   . Benign hypertension   . CAD (coronary artery disease), native coronary artery 06/19/2020  . Chronic HFrEF (heart failure with reduced ejection fraction) ([place])   . Chronic kidney disease, stage III (moderate) (HCC)   . Diabetes ([place])   . Gout   . Hyperlipidemia     Medications:  See medication history  Assessment: 21 YOM who presented on with CP, possible STEMI. The patient was on Apixaban PTA for hx Afib - now on hold and bridging with Heparin.   APTT level this evening is SUBtherapeutic (aPTT 39, goal of 66-102), Heparin level is falsely  elevated with recent Apixaban use. Levels were drawn early of steady state (~5.5 hours after initiation). No bleeding or issues noted per RN.  Goal of Therapy:  Heparin level 0.3-0.7 units/ml aPTT 66-102 seconds Monitor platelets by anticoagulation protocol: Yes   Plan:  - Increase Heparin to 1700 units/hr - Will follow-up on plans to transition back to Apixaban - Will continue to monitor for any signs/symptoms of bleeding and will follow up with heparin level in 8 hours   Thank you for allowing pharmacy to be a part of this patient's care.  Alycia Rossetti, PharmD, BCPS Clinical Pharmacist Clinical phone for 07/09/2020: [phone] 07/09/2020 7:13 PM   **Pharmacist phone directory can now be found on [email or web address] (  PW [email or web address]).  Listed under [place].

## 2020-07-09 NOTE — Consult Note (Signed)
[facility] Nurse ostomy follow up Patient intubated, receiving care in [facility]. I saw the patient 06/18/20, see my note from that day. This is a long term colostomy that the patient's wife normally attends to. For supplies I have ordered pouch Kellie Simmering #725, and barrier rings, Kellie Simmering [phone].  The patient was supposed to be seen in our ostomy clinic today at 1300, but is now inpatient. Val Riles, RN, MSN, CWOCN, CNS-BC, pager [phone]

## 2020-07-09 NOTE — Procedures (Signed)
Extubation Procedure Note  Patient Details:   Name: Charles Mcdonald DOB: 17-Sep-1945 MRN: [id number]   Airway Documentation:    Vent end date: 07/09/20 Vent end time: [id number]   Evaluation  O2 sats: stable throughout Complications: No apparent complications Patient did tolerate procedure well. Bilateral Breath Sounds: Coarse crackles   Patient extubated to [place]. Vital signs stable. No complications. RT will continue to monitor.   Yes  [name] 07/09/2020, 10:50 AM

## 2020-07-09 NOTE — Progress Notes (Signed)
NAME:  Charles Mcdonald, MRN:  [id number], DOB:  07-06-1945, LOS: 0 ADMISSION DATE:  07/08/2020, CONSULTATION DATE:  07/09/20 REFERRING MD:  Sedonia Small, CHIEF COMPLAINT:  Short of breath   History of Present Illness:  75 year old man with hx of CAD, CHF brought in for possible STEMI.   Apparently he had sudden onset chest pain and shortness of breath.   Per his wife at bedside (she is hard of hearing so difficult to get a history from her as well), he has been more short of breath for several weeks.  In route he became unresponsive, developed Vtach and was bagged (given nitro SL and ASA 324).   Intubated in the ED at 2350 for  Hypoxemia and AMS.   IN ED:   "EKG repeated and reviewed by cardiology at the bedside, given clinical picture namely the pulmonary edema on chest x-ray, the pulmonary edema to the lower extremities, history of CHF, lack of true STEMI criteria and, code STEMI canceled, favoring CHF exacerbation.  Hemodynamically stable, on the vent, will admit to intensivist service.  Providing Lasix.  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  Adding lasix '40mg'$  daily started  Synthroid started 26mg daily   Pertinent  Medical History  CHF (HF rEF) EF 20-25% Medtronic ICD since 2006 LBBB CAD, anterior MI in 2002, stent to Proximal LAD.   Atrial fibrillation, on eliquis HTN HLD Gout DM CKD III Hx of fournier's gangrene and surgery 11/22 - 1/22  Allupurinol 100 daily Amiodarone 400 daily eliquis '5mg'$  BID Vit C  ASA 81 lipitor '40mg'$  Colchicine 0.6 Vitamin D2 Insulin lantus 10 bedtime, 5 am Losartan 25 Metoprolol 25 protonix 40  Significant Hospital Events: Including procedures, antibiotic start and stop dates in addition to other pertinent events   . 4/18 admitted,  intubated  Interim History / Subjective:  Started on pressors overnight. Awake on vent, denies complaints.  Objective   Blood pressure 104/66, pulse (!) 59, temperature 98 F (36.7 C), temperature source Oral, resp. rate 18, height 6' (1.829 m), weight 90.7 kg, SpO2 100 %.    Vent Mode: PRVC FiO2 (%):  [80 %-100 %] 80 % Set Rate:  [18 bmp] 18 bmp Vt Set:  [630 mL-650 mL] 630 mL PEEP:  [5 cmH20] 5 cmH20 Plateau Pressure:  [27 cmH20-28 cmH20] 27 cmH20   Intake/Output Summary (Last 24 hours) at 07/09/2020 0719 Last data filed at 07/09/2020 0600 Gross per 24 hour  Intake 294.37 ml  Output 1500 ml  Net -1205.63 ml   Filed Weights   07/08/20 2351 07/09/20 0405  Weight: 91.2 kg 90.7 kg    Examination: Constitutional: no acute distress  Eyes: pupils equal, tracking Ears, nose, mouth, and throat: minimal secretions, trachea midline Cardiovascular: RRR, ext warm Respiratory: Diminished, triggering vent Gastrointestinal: soft, hypoactive BS Skin: No rashes, normal turgor Neurologic: moves all 4 ext to command Ext: 1+ edema Psychiatric: RASS 0    Labs/imaging that I havepersonally reviewed  (right click and "Reselect all SmartList Selections" daily)   Pct neg Lactate improving TSH up Sugars okay  Resolved Hospital Problem list     Assessment & Plan:  Acute hypoxemic respiratory failure:  Suspect his STEMI was actually Vtach leading to flash edema.   CAD- History of anterior MI in 2002 s/p  stenting to proximal LAD.additional NSTEMI in 2017. Coronary anatomy was essentially unchanged on cardiac catheterization and an unsuccessful attempt was made at a PCI of the LAD. On ASA, lipitor and metoprolol at home.  Trops neg here. Hx VT storm LBBB - Continue diuresis, replete chcmistries PRN - Check AM CXR - Will see how weans on O2 through day - Fent/propofol titrated to RASS 0 - Probably would benefit from R/L HC once euvolemic and off vent  Atrial fibrilation - on eliquis  at home.    - Restart eliquis  AKI on chronic kidney disease- trend with diuresis  Hypothyroidism. TSH a bit high, T4 normal, only just started synthroid, continue for now  DM; iss   Best practice (right click and "Reselect all SmartList Selections" daily)  Diet:  NPO, TF if not able to extubate today Pain/Anxiety/Delirium protocol (if indicated): Yes (RASS goal -1) VAP protocol (if indicated): Yes DVT prophylaxis: Systemic AC GI prophylaxis: PPI Glucose control:  SSI Yes Central venous access:  N/A Arterial line:  N/A Foley:  Yes, and it is still needed Mobility:  bed rest  PT consulted: N/A Last date of multidisciplinary goals of care discussion '[]'$  Code Status:  full code Disposition: ICU pending vent liberation  Additional 40 minutes cc time not including any separately billable procedures

## 2020-07-09 NOTE — ED Provider Notes (Signed)
[facility] Emergency Department Provider Note MRN:  [id number]  Arrival date & time: 07/09/20     Chief Complaint   Code STEMI   History of Present Illness   Charles Mcdonald is a 75 y.o. year-old male with a history of CAD, CHF presenting to the ED with chief complaint of code STEMI.  Code STEMI initiated by EMS prior to arrival.  Patient with sudden onset chest pain and shortness of breath this evening.  Worsening mental status and shortness of breath in route, requiring bag mask ventilation.  I was unable to obtain an accurate HPI, PMH, or ROS due to the patient's respiratory failure.  Level 5 caveat.  Review of Systems  Positive for chest pain, respiratory failure.  Patient's Health History    Past Medical History:  Diagnosis Date  . Acute on chronic respiratory failure with hypoxia ([place])   . Atrial fibrillation, chronic ([place])   . Benign hypertension   . CAD (coronary artery disease), native coronary artery 06/19/2020  . Chronic HFrEF (heart failure with reduced ejection fraction) ([place])   . Chronic kidney disease, stage III (moderate) (HCC)   . Diabetes ([place])   . Gout   . Hyperlipidemia     Past Surgical History:  Procedure Laterality Date  . IR THORACENTESIS ASP PLEURAL SPACE W/IMG GUIDE  04/17/2020  . SKIN SPLIT GRAFT Bilateral 03/27/2020   Procedure: SKIN GRAFT SPLIT THICKNESS;  Surgeon: Cindra Presume, MD;  Location: [facility];  Service: Plastics;  Laterality: Bilateral;  . WOUND EXPLORATION Bilateral 03/27/2020   Procedure: Exploration bilateral groin wounds with partial closure;  Surgeon: Cindra Presume, MD;  Location: [facility];  Service: Plastics;  Laterality: Bilateral;    Family History  Problem Relation Age of Onset  . Heart disease Father   . Diabetes Sister     Social History   Socioeconomic History  . Marital status: Married    Spouse name: Not on file  . Number of children: 7  . Years of education: Not on file  . Highest education  level: Not on file  Occupational History  . Occupation: Retried  Tobacco Use  . Smoking status: Former Research scientist (life sciences)  . Smokeless tobacco: Never Used  Substance and Sexual Activity  . Alcohol use: Never  . Drug use: Never  . Sexual activity: Not Currently  Other Topics Concern  . Not on file  Social History Narrative  . Not on file   Social Determinants of Health   Financial Resource Strain: Not on file  Food Insecurity: Not on file  Transportation Needs: Not on file  Physical Activity: Not on file  Stress: Not on file  Social Connections: Not on file  Intimate Partner Violence: Not on file     Physical Exam   Vitals:   07/08/20 2340 07/08/20 2357  BP: (!) 153/100 (!) 153/100  Pulse: (!) 103 (!) 111  Resp: (!) 35 18  SpO2: (!) 76% 100%    CONSTITUTIONAL: Ill-appearing, labored breathing NEURO: Somnolent, minimal response to painful stimuli EYES:  eyes equal and reactive ENT/NECK:  no LAD, no JVD CARDIO: Tachycardic rate, well-perfused, normal S1 and S2 PULM: Diffuse rhonchi GI/GU:  normal bowel sounds, non-distended, non-tender MSK/SPINE:  No gross deformities, no edema SKIN:  no rash, atraumatic PSYCH: Unable to assess  *Additional and/or pertinent findings included in MDM below  Diagnostic and Interventional Summary    EKG Interpretation  Date/Time:  Sunday July 08 2020 23:44:29 EDT Ventricular Rate:  108  PR Interval:  187 QRS Duration: 193 QT Interval:  412 QTC Calculation: 553 R Axis:   41 Text Interpretation: Sinus tachycardia Atrial premature complex Probable left atrial enlargement IVCD, consider atypical LBBB Confirmed by Gerlene Fee [phone]) on 07/09/2020 12:25:22 AM      Labs Reviewed  RESP PANEL BY RT-PCR (FLU A&B, COVID) [id number]  CBC  COMPREHENSIVE METABOLIC PANEL  LACTIC ACID, PLASMA  BRAIN NATRIURETIC PEPTIDE  TROPONIN I (HIGH SENSITIVITY)    DG Chest Port 1 View  Final Result      Medications  propofol (DIPRIVAN) 1000 MG/100ML  infusion (15 mcg/kg/min  91.2 kg Intravenous New Bag/Given 07/09/20 0002)  propofol (DIPRIVAN) 1000 MG/100ML infusion (has no administration in time range)  furosemide (LASIX) injection 40 mg (has no administration in time range)     Procedures  /  Critical Care .Critical Care Performed by: Maudie Flakes, MD Authorized by: Maudie Flakes, MD   Critical care provider statement:    Critical care time (minutes):  45   Critical care was necessary to treat or prevent imminent or life-threatening deterioration of the following conditions:  Respiratory failure   Critical care was time spent personally by me on the following activities:  Discussions with consultants, evaluation of patient's response to treatment, examination of patient, ordering and performing treatments and interventions, ordering and review of laboratory studies, ordering and review of radiographic studies, pulse oximetry, re-evaluation of patient's condition, obtaining history from patient or surrogate and review of old charts Procedure Name: Intubation Date/Time: 07/09/2020 12:21 AM Performed by: Maudie Flakes, MD Pre-anesthesia Checklist: Patient identified, Patient being monitored, Emergency Drugs available, Timeout performed and Suction available Oxygen Delivery Method: Non-rebreather mask Preoxygenation: Pre-oxygenation with 100% oxygen Induction Type: Rapid sequence Ventilation: Mask ventilation without difficulty Laryngoscope Size: Glidescope and 4 Grade View: Grade I Tube size: 8.0 mm Number of attempts: 1 Airway Equipment and Method: Rigid stylet Placement Confirmation: ETT inserted through vocal cords under direct vision,  CO2 detector and Breath sounds checked- equal and bilateral Secured at: 25 cm Tube secured with: ETT holder Difficulty Due To: Difficulty was unanticipated Comments: I assisted with RSI intubation performed by PA [place], see separate note for details.  10 mg etomidate, 100 mg rocuronium.   Intubated without difficulty.       ED Course and Medical Decision Making  I have reviewed the triage vital signs, the nursing notes, and pertinent available records from the EMR.  Listed above are laboratory and imaging tests that I personally ordered, reviewed, and interpreted and then considered in my medical decision making (see below for details).  EKG sent over electronically prior to patient arrival viewed by me, left bundle but with some potential ischemic morphology, decision made to move forward with code STEMI.  On arrival patient is minimally responsive, saturations in the 70s, requiring bag-valve-mask.  Not protecting airway, decision made to RSI.  With proper bag-valve-mask and seal we were able to achieve 100% oxygen saturations.  Blood pressure [id number] systolic.  See separate notes for RSI, no complications.  EKG repeated and reviewed by cardiology at the bedside, given clinical picture namely the pulmonary edema on chest x-ray, the pulmonary edema to the lower extremities, history of CHF, lack of true STEMI criteria and, code STEMI canceled, favoring CHF exacerbation.  Hemodynamically stable, on the vent, will admit to intensivist service.  Providing Lasix.  Sedation with propofol.       Barth Kirks. Sedonia Small, [place] [facility] [email or web address]  Final Clinical Impressions(s) / ED Diagnoses     ICD-10-CM   1. Acute on chronic congestive heart failure, unspecified heart failure type ([place])  I50.9   2. Acute respiratory failure with hypoxia (HCC)  J96.01     ED Discharge Orders    None       Discharge Instructions Discussed with and Provided to Patient:   Discharge Instructions   None       Maudie Flakes, MD 07/09/20 [phone]

## 2020-07-09 NOTE — H&P (Signed)
NAME:  Charles Mcdonald, MRN:  [id number], DOB:  07/01/1945, LOS: 0 ADMISSION DATE:  07/08/2020, CONSULTATION DATE:  07/09/20 REFERRING MD:  Sedonia Small, CHIEF COMPLAINT:  Short of breath   History of Present Illness:  74 year old man with hx of CAD, CHF brought in for possible STEMI.   Apparently he had sudden onset chest pain and shortness of breath.   Per his wife at bedside (she is hard of hearing so difficult to get a history from her as well), he has been more short of breath for several weeks.  In route he became unresponsive, developed Vtach and was bagged (given nitro SL and ASA 324).   Intubated in the ED at 2350 for  Hypoxemia and AMS.   IN ED:   "EKG repeated and reviewed by cardiology at the bedside, given clinical picture namely the pulmonary edema on chest x-ray, the pulmonary edema to the lower extremities, history of CHF, lack of true STEMI criteria and, code STEMI canceled, favoring CHF exacerbation.  Hemodynamically stable, on the vent, will admit to intensivist service.  Providing Lasix.  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   Atrial fibrillation, on eliquis HTN HLD Gout DM CKD III Hx of fournier's gangrene and surgery 11/22 - 1/22  Allupurinol 100 daily Amiodarone 400 daily eliquis '5mg'$  BID Vit C  ASA 81 lipitor '40mg'$  Colchicine 0.6 Vitamin D2 Insulin lantus 10 bedtime, 5 am Losartan 25 Metoprolol 25 protonix 40  Significant Hospital Events: Including procedures, antibiotic start and stop dates in addition to other pertinent events   .   Interim History /  Subjective:    Objective   Blood pressure (!) 88/56, pulse 68, resp. rate 18, height 6' (1.829 m), weight 91.2 kg, SpO2 100 %.    Vent Mode: PRVC FiO2 (%):  [100 %] 100 % Set Rate:  [18 bmp] 18 bmp Vt Set:  [650 mL] 650 mL PEEP:  [5 cmH20] 5 cmH20 Plateau Pressure:  [28 cmH20] 28 cmH20  No intake or output data in the 24 hours ending 07/09/20 0109 Filed Weights   07/08/20 2351  Weight: 91.2 kg    Examination: General: nad, sedated HENT: NCAT, intubated  Lungs: Rhonchi B  Cardiovascular: RRR no mgr Abdomen: nt, nd, nbs Extremities: B 3+ Upt to knees  Neuro: sedated, moving extremities spontaneously GU: foley with pale urine  Labs/imaging that I havepersonally reviewed  (right click and "Reselect all SmartList Selections" daily)  7.199/51.2/44 (venous) 7.312/43.2/115 (ABG)  BNP 2,369 Trop 26 Lactate 4.3  WBC 13.9 covid pending  CXR: IMPRESSION: Tubes and lines as described above.  Patchy airspace opacity bilaterally as described. Edema versus multifocal infiltrate.  EKG LBBB  Echo 06/16/20 1. Left ventricular ejection fraction, by estimation, is 20 to 25%. The  left ventricle has severely decreased function. The left ventricle  demonstrates regional wall motion abnormalities (see scoring  diagram/findings for description). The left  ventricular internal cavity size was mildly dilated. Left ventricular  diastolic parameters are consistent with Grade II diastolic dysfunction  (pseudonormalization). Elevated left atrial pressure. There is scarring  and akinesis of the left ventricular,  entire anteroseptal wall, anterior wall and apical segment. There is  moderate hypokinesis of the left ventricular, entire inferoseptal wall,  inferior wall and inferolateral wall.  2. Right ventricular systolic function is mildly reduced. The right  ventricular size is normal. There is mildly elevated pulmonary artery  systolic pressure.  3. Left atrial size was severely  dilated.  Resolved Hospital Problem list     Assessment & Plan:  Acute hypoxemic respiratory failure:  Likely  2/2 CHF exacerbation. Initially thought to be STEMI.  Cardiology has seen patient, compared EKG to prior.   Continue diuresis.  Current UOP is good, will cont to monitor.    CAD- History of anterior MI in 2002 s/p stenting to proximal LAD.additional NSTEMI in 2017. Coronary anatomy was essentially unchanged on cardiac catheterization and an unsuccessful attempt was made at a PCI of the LAD. On ASA, lipitor and metoprolol at home.   Atrial fibrilation - on eliquis at home.  Starting heparin for now for anticoagulation while critically ill, more easily held for procedures if needed.    AKI on chronic kidney disease; likely Cardiorenal.  UA pending.   Hypothyroidism. Recently started synthroid 25mg. Rechecking TSH and T4.   DM; iss   Best practice (right click and "Reselect all SmartList Selections" daily)  Diet:  NPO Pain/Anxiety/Delirium protocol (if indicated): Yes (RASS goal -1) VAP protocol (if indicated): Yes DVT prophylaxis: Systemic AC GI prophylaxis: PPI Glucose control:  SSI Yes Central venous access:  N/A Arterial line:  N/A Foley:  Yes, and it is still needed Mobility:  bed rest  PT consulted: N/A Last date of multidisciplinary goals of care discussion '[]'$  Code Status:  full code Disposition:  Briefly discussed care with wife at bedside.   Labs   CBC: Recent Labs  Lab 07/02/20 1105 07/08/20 2356 07/09/20 0030 07/09/20 0046  WBC 9.7 13.9*  --   --   HGB 11.7* 12.3* 10.5* 10.9*  HCT 36.9* 40.3 31.0* 32.0*  MCV 85 92.4  --   --   PLT 398 431*  --   --     Basic Metabolic Panel: Recent Labs  Lab 07/02/20 1105 07/09/20 0030 07/09/20 0046  NA 142 143 143  K 4.7 3.8 3.7  CL 104  --   --   CO2 20  --   --   GLUCOSE 99  --   --   BUN 26  --   --   CREATININE 1.96*  --   --   CALCIUM 9.8  --   --   MG 2.0  --   --    GFR: Estimated  Creatinine Clearance: 37.4 mL/min (A) (by C-G formula based on SCr of 1.96 mg/dL (H)). Recent Labs  Lab 07/02/20 1105 07/08/20 2356  WBC 9.7 13.9*    Liver Function Tests: No results for input(s): AST, ALT, ALKPHOS, BILITOT, PROT, ALBUMIN in the last 168 hours. No results for input(s): LIPASE, AMYLASE in the last 168 hours. No results for input(s): AMMONIA in the last 168 hours.  ABG    Component Value Date/Time   PHART 7.312 (L) 07/09/2020 0046   PCO2ART 43.2 07/09/2020 0046   PO2ART 115 (H) 07/09/2020 0046   HCO3 21.9 07/09/2020 0046   TCO2 23 07/09/2020 0046   ACIDBASEDEF 4.0 (H) 07/09/2020 0046   O2SAT 98.0 07/09/2020 0046     Coagulation Profile: No results for input(s): INR, PROTIME in the last 168 hours.  Cardiac  Enzymes: No results for input(s): CKTOTAL, CKMB, CKMBINDEX, TROPONINI in the last 168 hours.  HbA1C: Hgb A1c MFr Bld  Date/Time Value Ref Range Status  06/16/2020 04:37 PM 6.4 (H) 4.8 - 5.6 % Final    Comment:    (NOTE) Pre diabetes:          5.7%-6.4%  Diabetes:              >6.4%  Glycemic control for   <7.0% adults with diabetes   03/01/2020 04:42 AM 9.3 (H) 4.8 - 5.6 % Final    Comment:    (NOTE) Pre diabetes:          5.7%-6.4%  Diabetes:              >6.4%  Glycemic control for   <7.0% adults with diabetes     CBG: No results for input(s): GLUCAP in the last 168 hours.  Review of Systems:   Unable to assess  Past Medical History:  He,  has a past medical history of Acute on chronic respiratory failure with hypoxia ([place]), Atrial fibrillation, chronic ([place]), Benign hypertension, CAD (coronary artery disease), native coronary artery (06/19/2020), Chronic HFrEF (heart failure with reduced ejection fraction) ([place]), Chronic kidney disease, stage III (moderate) ([place]), Diabetes ([place]), Gout, and Hyperlipidemia.   Surgical History:   Past Surgical History:  Procedure Laterality Date  . IR THORACENTESIS ASP PLEURAL SPACE W/IMG GUIDE   04/17/2020  . SKIN SPLIT GRAFT Bilateral 03/27/2020   Procedure: SKIN GRAFT SPLIT THICKNESS;  Surgeon: Cindra Presume, MD;  Location: [facility];  Service: Plastics;  Laterality: Bilateral;  . WOUND EXPLORATION Bilateral 03/27/2020   Procedure: Exploration bilateral groin wounds with partial closure;  Surgeon: Cindra Presume, MD;  Location: [facility];  Service: Plastics;  Laterality: Bilateral;     Social History:   reports that he has quit smoking. He has never used smokeless tobacco. He reports that he does not drink alcohol and does not use drugs.   Family History:  His family history includes Diabetes in his sister; Heart disease in his father.   Allergies No Known Allergies    Home Medications  Prior to Admission medications   Medication Sig Start Date End Date Taking? Authorizing Provider  allopurinol (ZYLOPRIM) 100 MG tablet Take 100 mg by mouth daily.   Yes [provider]  amiodarone (PACERONE) 200 MG tablet Take 2 tablets (400 mg total) by mouth daily. 06/20/20  Yes Barrett, Evelene Croon, PA-C  apixaban (ELIQUIS) 5 MG TABS tablet Take 5 mg by mouth every 12 (twelve) hours. 12/01/19  Yes [provider]  ascorbic acid (VITAMIN C) 500 MG tablet Take 500 mg by mouth daily.   Yes [provider]  aspirin EC 81 MG tablet Take 81 mg by mouth as directed. Swallow whole.   Yes [provider]  atorvastatin (LIPITOR) 40 MG tablet Take 40 mg by mouth at bedtime. 11/11/15  Yes [provider]  colchicine 0.6 MG tablet Take 0.6 mg by mouth See admin instructions. Take 0.6 mg by mouth as directed for gout flares   Yes [provider]  ergocalciferol (VITAMIN D2) 1.25 MG (50000 UT) capsule Take 50,000 Units by mouth once a week.   Yes [provider]  glucose blood test strip Accu-Chek Aviva Plus test strips   Yes [provider]  insulin lispro (HUMALOG) 100 UNIT/ML KwikPen Inject 5 Units into the skin 2 (two) times daily before a meal.    Yes  [provider]  LANTUS SOLOSTAR 100 UNIT/ML Solostar Pen Inject 5-10 Units into the skin See admin instructions. Inject 10 units into the skin before breakfast and 5 units at bedtime   Yes [provider]  losartan (COZAAR) 25 MG tablet Take 1 tablet (25 mg total) by mouth daily. 06/19/20  Yes Barrett, Evelene Croon, PA-C  metoprolol succinate (TOPROL-XL) 25 MG 24 hr tablet Take 25 mg by mouth at bedtime.   Yes [provider]  nitroGLYCERIN (NITROSTAT) 0.4 MG SL tablet Place 1 tablet (0.4 mg total) under the tongue every 5 (five) minutes x 3 doses as needed for chest pain. 06/19/20  Yes Barrett, Evelene Croon, PA-C  pantoprazole (PROTONIX) 40 MG tablet Take 40 mg by mouth at bedtime. 04/21/20  Yes [provider]  potassium chloride SA (KLOR-CON) 20 MEQ tablet Take 1 tablet (20 mEq total) by mouth daily. 06/22/20  Yes Croitoru, Mihai, MD     Critical care time: 60 min

## 2020-07-09 NOTE — ED Notes (Signed)
Attempted report to 2H at this time

## 2020-07-09 NOTE — Progress Notes (Signed)
Initial Nutrition Assessment  RD working remotely.  DOCUMENTATION CODES:   Not applicable  INTERVENTION:  - if unable to extubate today, recommendations for TF regimen: Vital 1.5 @ 45 ml/hr with 45 ml Prosource TF TID. - this regimen, without kcal from current propofol rate, will provide 1740 kcal (94% kcal need), 106 grams protein, and 825 ml free water.  - free water flush per CCM given severity of edema for patient with hx of CHF.    NUTRITION DIAGNOSIS:   Inadequate oral intake related to inability to eat as evidenced by NPO status.  GOAL:   Patient will meet greater than or equal to 90% of their needs  MONITOR:   Vent status,Labs,Weight trends,I & O's  REASON FOR ASSESSMENT:   Ventilator    ASSESSMENT:   75 year old male with medical history of CAD, CHF, stage 3 CKD, HLD, HTN, DM, and gout. He presented to the ED due to possible STEMI. In the ED his wife reported that patient had sudden onset of chest pain and several weeks of progressive shortness of breath. He was intubated in the ED.  Patient was intubated in the ED. LDA avatar documentation indicates he has a PEG/G-tube in place.   RD working on another campus and unable to see patient in person.   Notes indicate patient has gained 17 lb in the past few weeks. Documentation in the edema section of flow sheet indicates patient has mild pitting edema to BUE and deep pitting edema to BLE.   Weight today is 200 lb, weight yesterday was 201 lb, and weight on 3/29 was 184 lb. Used weight from 3/29 to estimate needs.   Per notes: - acute hypoxemic respiratory failure - LBBB - AKI on stage 3 CKD - plan for TF initiation if unable to extubate today   Patient is currently intubated on ventilator support MV: 11 L/min Temp (24hrs), Avg:98.2 F (36.8 C), Min:98 F (36.7 C), Max:98.4 F (36.9 C) Propofol: 2.76 ml/hr (73 kcal/24 hrs)  Labs reviewed; CBGs: 125 and 125 mg/dl, BUN: 24 mg/dl, creatinine: 2.14 mg/dl,  GFR: 32 ml/min.  Medications reviewed; 100 mg colace BID, 40 mg IV lasix TID, sliding scale novolog, 40 mg IV protonix/day, 17 g miralax/day.    NUTRITION - FOCUSED PHYSICAL EXAM:  unable to complete at this time.   Diet Order:   Diet Order            Diet NPO time specified  Diet effective now                 EDUCATION NEEDS:   Not appropriate for education at this time  Skin:  Skin Assessment: Reviewed RN Assessment  Last BM:  4/18 (type 3 x1)  Height:   Ht Readings from Last 1 Encounters:  07/08/20 6' (1.829 m)    Weight:   Wt Readings from Last 1 Encounters:  07/09/20 90.7 kg    Estimated Nutritional Needs:  Kcal:  1858 kcal Protein:  100-125 grams protein Fluid:  >/= 1.5 L/day      Jarome Matin, MS, RD, LDN, CNSC Inpatient Clinical Dietitian RD pager # available in [facility]  After hours/weekend pager # available in [facility]

## 2020-07-09 NOTE — Consult Note (Signed)
Cardiology Consultation:   Patient ID: [name] MRN: [id number]; DOB: Aug 05, 1945  Admit date: 07/08/2020 Date of Consult: 07/09/2020  PCP:  Charles Mcdonald, [facility]  Cardiologist:  Sanda Klein, MD  Advanced Practice Provider:  No care team member to display Electrophysiologist:  None   Patient Profile:   Charles Mcdonald is a 75 y.o. 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 Tamala Julian.  History of Present Illness:   Charles Mcdonald is a 75 year old male with past medical history noted above.  Has a long history of ischemic heart disease.  Had an anterior MI in 2002 which was treated with a stent to the proximal LAD.  EF at that time was noted at 37%.  Follow-up cardiac cath in 2004 showed occlusion of proximal LAD, mid RCA and mid circumflex with collaterals.  He was "turned down for surgical revascularization". Reportedly no evidence of viability although unclear what type of study was used to assess this.  He had a non-STEMI in 2017.  Coronary anatomy was essentially unchanged on cardiac cath and unsuccessful attempt was made at PCI of the LAD.  Subsequent echocardiograms and nuclear stress test since that time had shown varying degrees of LV dysfunction with continued EF of 25 to 30%.  Had initial ICD implant in 2006 for primary prevention.  His high-voltage lead is reportedly "on advisory" (details unknown) and had a GEN change on 06/2018.  At his last intervention an attempt was made to upgrade his device to a biventricular pacemaker/defibrillator but was unsuccessful since  passive LV lead repeatedly dislodged.  Plan was to proceed with epicardial lead active fixation of LV lead at a future date but did not happen.  Also has a history of persistent atrial fibrillation and underwent cavotricuspid isthmus ablation in 04/2014.  He was previously followed by Dr. Uvaldo Rising, EP at [facility].   He was hospitalized at [facility] 2021 with septic shock due to Fournier's gangrene which required surgical debridement.  Device was interrogated during that hospitalization that showed several shocks.  Summary reported episodes of VT that were "self resolved", as well as synchronized cardioversion for atrial fibrillation with RVR.  Was treated with IV amiodarone and pressors.  He required intubation along with PEG tube placement.  He was discharged to [facility] on 02/2020.  Underwent repeated surgical procedures for groin wounds in January 2022.  Device interrogation showed defibrillator shocks during that time.  It was unclear whether these shocks were acknowledged by the patient as the device was not interrogated during that time.  He was eventually discharged home with continued PEG and colostomy.  He was readmitted to [facility] on 06/16/2020 for VT storm after presenting with diaphoresis and multiple ICD shocks.  Reported to receive a total of 103 ICD shocks.  Interrogation of device while in the ED showed lower rate programmed to 60 bpm with 86% atrial sensed, ventricular sensed rhythm and only 14% atrial pacing and 0.2% ventricular pacing. Activity level had been very poor throughout the months of November to  January when he was hospitalized and very low levels of activity (0.1 hours a day) since the beginning of February when he returned home. There had been no significant atrial fibrillation. Thoracic impedence had been out of range ever since he was initially hospitlaized in November and had never returned to baseline. He had over 100  episodes of VT over 2 hour span before presentation. A total of 55 of 61 episodes were pace terminated and another 73 of 74 episodes were shock terminated with a total of 103 shocks delivered. Multiple episodes of monophoric ventricular tachycardia over 200 bpm were treated as being in the VF zone usually with successful defibrillation with a single shock. ATP never intervened since the arrhythmia was too fast and ATP is turned off during charge in the VF zone.  VT storm was felt to be secondary to severe hypokalemia with a potassium of 2.1 on admission.  High-sensitivity troponin was elevated and peaked at 1810 which was felt to be demand ischemia in the setting of recurrent ICD shocks.  His electrolytes were corrected and placed on amiodarone without recurrent VT. echo during admission showed an EF of 20 to 25% with akinesis of the anterior septal wall, anterior wall and apical segments as well as grade 2 diastolic dysfunction.  TSH was markedly elevated at 15.865.   Presented back for follow-up in the office on 07/02/2020.  Weight was noted to be up 17 pounds.  No recurrent ICD shocks since discharge.  He was started on Lasix.  Message was sent to enroll in device clinic, and referred to EP.  Also started on Synthroid 25 mcg daily.   Presented to the ED on 4/17 with shortness of breath. Reports he went to bed that evening and was awoken from sleep with severe shortness of breath. Called EMS. En route reported to become unresponsive with reported Vtach. He was bagged en route and intubated in the ED. Labs on admission showed stable electrolytes, Cr 2.14, BNP 2369, Lactic Acid 4.3, WBC 13.9, hsTn 26>>47. EKG initially called Code STEMI in the ED but canceled by cardiology at the bedside. CXR with edema vs multifocal infiltrate. He was admitted to [facility] for further management. He has now been weaned and extubated this morning. In talking with him he reports his weights have fluctuated at home. He does not  believed he has been taking lasix. LE have been swollen. Really no reports of chest pain. Tolerating [place] at the time of assessment.   Past Medical History:  Diagnosis Date  . Acute on chronic respiratory failure with hypoxia ([place])   . Atrial fibrillation, chronic ([place])   . Benign hypertension   . CAD (coronary artery disease), native coronary artery 06/19/2020  . Chronic HFrEF (heart failure with reduced ejection fraction) ([place])   . Chronic kidney disease, stage III (moderate) (HCC)   . Diabetes ([place])   . Gout   . Hyperlipidemia     Past Surgical History:  Procedure Laterality Date  . IR THORACENTESIS ASP PLEURAL SPACE W/IMG GUIDE  04/17/2020  . SKIN SPLIT GRAFT Bilateral 03/27/2020   Procedure: SKIN GRAFT SPLIT THICKNESS;  Surgeon: Cindra Presume, MD;  Location: [facility];  Service: Plastics;  Laterality: Bilateral;  . WOUND EXPLORATION Bilateral 03/27/2020   Procedure: Exploration bilateral groin wounds with partial closure;  Surgeon: Cindra Presume, MD;  Location: [place];  Service: Plastics;  Laterality: Bilateral;     Home Medications:  Prior to Admission medications   Medication Sig Start Date  End Date Taking? Authorizing Provider  allopurinol (ZYLOPRIM) 100 MG tablet Take 100 mg by mouth daily.   Yes [provider]  amiodarone (PACERONE) 200 MG tablet Take 2 tablets (400 mg total) by mouth daily. 06/20/20  Yes Barrett, Evelene Croon, PA-C  apixaban (ELIQUIS) 5 MG TABS tablet Take 5 mg by mouth every 12 (twelve) hours. 12/01/19  Yes [provider]  ascorbic acid (VITAMIN C) 500 MG tablet Take 500 mg by mouth daily.   Yes [provider]  aspirin EC 81 MG tablet Take 81 mg by mouth as directed. Swallow whole.   Yes [provider]  atorvastatin (LIPITOR) 40 MG tablet Take 40 mg by mouth at bedtime. 11/11/15  Yes [provider]  colchicine 0.6 MG tablet Take 0.6 mg by mouth See admin instructions. Take 0.6 mg by mouth as directed for gout flares   Yes  [provider]  ergocalciferol (VITAMIN D2) 1.25 MG (50000 UT) capsule Take 50,000 Units by mouth once a week.   Yes [provider]  glucose blood test strip Accu-Chek Aviva Plus test strips   Yes [provider]  insulin lispro (HUMALOG) 100 UNIT/ML KwikPen Inject 5 Units into the skin 2 (two) times daily before a meal.   Yes [provider]  LANTUS SOLOSTAR 100 UNIT/ML Solostar Pen Inject 5-10 Units into the skin See admin instructions. Inject 10 units into the skin before breakfast and 5 units at bedtime   Yes [provider]  losartan (COZAAR) 25 MG tablet Take 1 tablet (25 mg total) by mouth daily. 06/19/20  Yes Barrett, Evelene Croon, PA-C  metoprolol succinate (TOPROL-XL) 25 MG 24 hr tablet Take 25 mg by mouth at bedtime.   Yes [provider]  nitroGLYCERIN (NITROSTAT) 0.4 MG SL tablet Place 1 tablet (0.4 mg total) under the tongue every 5 (five) minutes x 3 doses as needed for chest pain. 06/19/20  Yes Barrett, Evelene Croon, PA-C  pantoprazole (PROTONIX) 40 MG tablet Take 40 mg by mouth at bedtime. 04/21/20  Yes [provider]  potassium chloride SA (KLOR-CON) 20 MEQ tablet Take 1 tablet (20 mEq total) by mouth daily. 06/22/20  Yes Croitoru, Mihai, MD    Inpatient Medications: Scheduled Meds: . amiodarone  200 mg Per Tube Daily  . aspirin  81 mg Per Tube Daily  . atorvastatin  40 mg Per Tube Daily  . chlorhexidine gluconate (MEDLINE KIT)  15 mL Mouth Rinse BID  . Chlorhexidine Gluconate Cloth  6 each Topical Daily  . docusate  100 mg Per Tube BID  . furosemide  40 mg Intravenous Q8H  . insulin aspart  0-20 Units Subcutaneous Q4H  . mouth rinse  15 mL Mouth Rinse 10 times per day  . pantoprazole (PROTONIX) IV  40 mg Intravenous QHS  . polyethylene glycol  17 g Per Tube Daily   Continuous Infusions: . sodium chloride 10 mL/hr at 07/09/20 0600  . fentaNYL infusion INTRAVENOUS 25 mcg/hr (07/09/20 0600)  . heparin    .  norepinephrine (LEVOPHED) Adult infusion 4 mcg/min (07/09/20 0600)  . propofol (DIPRIVAN) infusion 5 mcg/kg/min (07/09/20 0600)  . propofol     PRN Meds: docusate, fentaNYL, ipratropium-albuterol, ondansetron (ZOFRAN) IV, polyethylene glycol  Allergies:   No Known Allergies  Social History:   Social History   Socioeconomic History  . Marital status: Married    Spouse name: Not on file  . Number of children: 7  . Years of education: Not on file  .  Highest education level: Not on file  Occupational History  . Occupation: Retried  Tobacco Use  . Smoking status: Former Research scientist (life sciences)  . Smokeless tobacco: Never Used  Substance and Sexual Activity  . Alcohol use: Never  . Drug use: Never  . Sexual activity: Not Currently  Other Topics Concern  . Not on file  Social History Narrative  . Not on file   Social Determinants of Health   Financial Resource Strain: Not on file  Food Insecurity: Not on file  Transportation Needs: Not on file  Physical Activity: Not on file  Stress: Not on file  Social Connections: Not on file  Intimate Partner Violence: Not on file    Family History:    Family History  Problem Relation Age of Onset  . Heart disease Father   . Diabetes Sister      ROS:  Please see the history of present illness.   All other ROS reviewed and negative.     Physical Exam/Data:   Vitals:   07/09/20 0700 07/09/20 0710 07/09/20 0726 07/09/20 0800  BP: 104/66     Pulse: (!) 59   (!) 58  Resp: 18   18  Temp:   98.4 F (36.9 C)   TempSrc:   Oral   SpO2: 100% 100%    Weight:      Height:        Intake/Output Summary (Last 24 hours) at 07/09/2020 1035 Last data filed at 07/09/2020 0600 Gross per 24 hour  Intake 294.37 ml  Output 1500 ml  Net -1205.63 ml   Last 3 Weights 07/09/2020 07/08/2020 07/04/2020  Weight (lbs) 199 lb 15.3 oz 201 lb 1 oz 201 lb  Weight (kg) 90.7 kg 91.2 kg 91.173 kg     Body mass index is 27.12 kg/m.  General:  Well nourished, well  developed, in no acute distress. Wearing [place] HEENT: normal Lymph: no adenopathy Neck: + JVD to jaw Endocrine:  No thryomegaly Vascular: No carotid bruits; FA pulses 2+ bilaterally without bruits  Cardiac:  normal S1, S2; RRR; no murmur  Lungs:  clear to auscultation bilaterally, no wheezing, rhonchi or rales  Abd: soft, nontender, no hepatomegaly  Ext: 1-2+ LE edema to bilateral ankles, 1+ pretibial edema Musculoskeletal:  No deformities, BUE and BLE strength normal and equal Skin: warm and dry  Neuro:  CNs 2-12 intact, no focal abnormalities noted Psych:  Normal affect   EKG:  The EKG was personally reviewed and demonstrates:  ST 108 bpm, LBBB  Relevant CV Studies:  Echo: 06/16/20  IMPRESSIONS    1. Left ventricular ejection fraction, by estimation, is 20 to 25%. The  left ventricle has severely decreased function. The left ventricle  demonstrates regional wall motion abnormalities (see scoring  diagram/findings for description). The left  ventricular internal cavity size was mildly dilated. Left ventricular  diastolic parameters are consistent with Grade II diastolic dysfunction  (pseudonormalization). Elevated left atrial pressure. There is scarring  and akinesis of the left ventricular,  entire anteroseptal wall, anterior wall and apical segment. There is  moderate hypokinesis of the left ventricular, entire inferoseptal wall,  inferior wall and inferolateral wall.  2. Right ventricular systolic function is mildly reduced. The right  ventricular size is normal. There is mildly elevated pulmonary artery  systolic pressure.  3. Left atrial size was severely dilated.  4. The mitral valve is normal in structure. Mild mitral valve  regurgitation.  5. The aortic valve is tricuspid. Aortic valve regurgitation is  not  visualized. Mild aortic valve sclerosis is present, with no evidence of  aortic valve stenosis.  6. The inferior vena cava is dilated in size with <50%  respiratory  variability, suggesting right atrial pressure of 15 mmHg.   Comparison(s): No significant change from prior study. Prior images  reviewed side by side.   Laboratory Data:  High Sensitivity Troponin:   Recent Labs  Lab 06/16/20 1637 06/16/20 1808 07/08/20 2356 07/09/20 0156  TROPONINIHS 1,214* 1,810* 26* 47*     Chemistry Recent Labs  Lab 07/02/20 1105 07/02/20 1105 07/08/20 2356 07/09/20 0030 07/09/20 0046 07/09/20 0320  NA 142   < > 142 143 143 141  K 4.7  --  4.2 3.8 3.7 4.1  CL 104  --  111  --   --   --   CO2 20  --  20*  --   --   --   GLUCOSE 99  --  220*  --   --   --   BUN 26  --  24*  --   --   --   CREATININE 1.96*  --  2.14*  --   --   --   CALCIUM 9.8  --  9.4  --   --   --   GFRNONAA  --   --  32*  --   --   --   ANIONGAP  --   --  11  --   --   --    < > = values in this interval not displayed.    Recent Labs  Lab 07/08/20 2356  PROT 6.8  ALBUMIN 3.4*  AST 21  ALT 16  ALKPHOS 124  BILITOT 0.4   Hematology Recent Labs  Lab 07/02/20 1105 07/08/20 2356 07/08/20 2356 07/09/20 0030 07/09/20 0046 07/09/20 0320  WBC 9.7 13.9*  --   --   --   --   RBC 4.32 4.36  --   --   --   --   HGB 11.7* 12.3*   < > 10.5* 10.9* 10.9*  HCT 36.9* 40.3   < > 31.0* 32.0* 32.0*  MCV 85 92.4  --   --   --   --   MCH 27.1 28.2  --   --   --   --   MCHC 31.7 30.5  --   --   --   --   RDW 16.4* 17.7*  --   --   --   --   PLT 398 431*  --   --   --   --    < > = values in this interval not displayed.   BNP Recent Labs  Lab 07/02/20 1105 07/08/20 2356  BNP 1,560.5* 2,369.9*    DDimer No results for input(s): DDIMER in the last 168 hours.   Radiology/Studies:  DG Chest Port 1 View  Result Date: 07/09/2020 CLINICAL DATA:  Shortness of breath, check endotracheal tube placement EXAM: PORTABLE CHEST 1 VIEW COMPARISON:  06/16/2020 FINDINGS: Endotracheal tube and gastric catheter are noted in satisfactory position. Stable defibrillator is seen.  Patchy airspace opacities are noted bilaterally right slightly greater than left. These changes are new from the prior exam and may simply represent pulmonary edema although multifocal pneumonia deserves consideration as well. No bony abnormality is seen. IMPRESSION: Tubes and lines as described above. Patchy airspace opacity bilaterally as described. Edema versus multifocal infiltrate. Electronically Signed   By: Linus Mako.D.  On: 07/09/2020 00:17     Assessment and Plan:   JERMANI EBERLEIN is a 75 y.o. 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 Tamala Julian.  1. Acute hypoxic respiratory failure: suspect 2/2 acute on chronic CHF exacerbation.  Required intubation on admission, but now extubated on [place]. BNP 2369, CXR with edema. In talking with patient, plan from recent office note was to start lasix but not appear that he ever did. Reports compliance with low Na+ diet. -- now weaned to [place] -- continued diuresis  2. Acute on Chronic ICM: has known EF of 20-25% with severe CAD that is not amenable to PCI/CABG from cath in 2017.  -- was hypotensive on admission, therefore would add back GDMT as tolerated (Toprol, losartan PTA) -- continue with IV lasix, net - 1.2L but RN reports incontinence episode as well -- will need to ensure daily diuretic at discharge  3. Known extensive hx of CAD: no chest pain per his report PTA. Has known extensive CAD from last reported cath in 2017 with occluded LAD, Lcx and RCA not amenable to PCI or CABG. hsTn with low flat trend. EKG with known LBBB -- at this time do not anticipate further  ischemic work up, favor treatment for CHF -- will attempt to get cath films from [facility] for further review -- on asa, statin  4. PAF: on eliquis PTA, held on admission -- currently on IV heparin, transition back to Eliquis  5. Hx of VT s/p ICD: prior admission with VT storm in the setting of severe hypokalemia -- notes indicate possible VT en route with EMS but no strips available to confirm -- will ask for device interrogation -- continue PO amiodarone 237m daily  6. Hypothyroidism: TSH 17, normal T4 -- on synthroid   7. CKD stage III: baseline Cr around 1.8. 2.14>>2.10 -- monitor with diuresis   Risk Assessment/Risk Scores:    New York Heart Association (NYHA) Functional Class NYHA Class III  CHA2DS2-VASc Score = 5  This indicates a 7.2% annual risk of stroke. The patient's score is based upon: CHF History: Yes HTN History: Yes Diabetes History: Yes Stroke History: No Vascular Disease History: Yes Age Score: 1 Gender Score: 0   For questions or updates, please contact CLake OzarkPlease consult [email or web address] for contact info under    Signed, LReino Bellis NP  07/09/2020 10:35 AM

## 2020-07-09 NOTE — Progress Notes (Signed)
  Echocardiogram 2D Echocardiogram has been performed.  Charles Mcdonald 07/09/2020, 11:54 AM

## 2020-07-09 NOTE — ED Notes (Signed)
Lactic acid 4.3 per lab. ED MD to be made aware

## 2020-07-09 NOTE — Plan of Care (Signed)

## 2020-07-09 NOTE — ED Provider Notes (Signed)
Procedure Name: Intubation Date/Time: 07/09/2020 3:56 AM Performed by: Tedd Sias, PA Pre-anesthesia Checklist: Patient identified, Patient being monitored, Emergency Drugs available, Timeout performed and Suction available Oxygen Delivery Method: Ambu bag Preoxygenation: Pre-oxygenation with 100% oxygen Induction Type: Rapid sequence Ventilation: Two handed mask ventilation required Laryngoscope Size: Glidescope Grade View: Grade I Endobronchial tube: 37 Fr Tube size: 8.0 mm Number of attempts: 1 Airway Equipment and Method: Video-laryngoscopy Placement Confirmation: CO2 detector and Breath sounds checked- equal and bilateral (ET tube inserted through vocal cords through video-assisted vision.) Secured at: 25 cm Tube secured with: ETT holder Dental Injury: Teeth and Oropharynx as per pre-operative assessment  Comments: Patient intubated by this note Probation officer. Dr. Sedonia Small was at bedside to assist as needed. RSI drugs were administered by attending physician.  Please see his separate note.         Charles Mcdonald, [place] 07/09/20 0401    Maudie Flakes, MD 07/09/20 [phone]

## 2020-07-09 NOTE — Progress Notes (Addendum)
[facility] Progress Note Patient Name: Charles Mcdonald DOB: Nov 14, 1945 MRN: [id number]   Date of Service  07/09/2020  HPI/Events of Note  Patient admitted with congestive heart failure and now hypotensive following diuresis. He only has peripheral iv's.  eICU Interventions  Peripheral Norepinephrine infusion orders entered. New Patient evaluation completed.        Kerry Kass Shelbee Apgar 07/09/2020, 5:24 AM

## 2020-07-09 NOTE — Progress Notes (Signed)
ANTICOAGULATION CONSULT NOTE - Initial Consult  Pharmacy Consult for heparin Indication: atrial fibrillation  No Known Allergies  Patient Measurements: Height: 6' (182.9 cm) Weight: 91.2 kg (201 lb 1 oz) IBW/kg (Calculated) : 77.6 Heparin Dosing Weight: 91.2 kg  Vital Signs: BP: 143/76 (04/18 0215) Pulse Rate: 80 (04/18 0215)  Labs: Recent Labs    07/08/20 2356 07/09/20 0030 07/09/20 0046  HGB 12.3* 10.5* 10.9*  HCT 40.3 31.0* 32.0*  PLT 431*  --   --   CREATININE 2.14*  --   --   TROPONINIHS 26*  --   --     Estimated Creatinine Clearance: 34.2 mL/min (A) (by C-G formula based on SCr of 2.14 mg/dL (H)).   Medical History: Past Medical History:  Diagnosis Date  . Acute on chronic respiratory failure with hypoxia ([place])   . Atrial fibrillation, chronic ([place])   . Benign hypertension   . CAD (coronary artery disease), native coronary artery 06/19/2020  . Chronic HFrEF (heart failure with reduced ejection fraction) ([place])   . Chronic kidney disease, stage III (moderate) (HCC)   . Diabetes ([place])   . Gout   . Hyperlipidemia     Medications:  See medication history  Assessment: 75 yo man on eliquis PTA to start heparin while eliquis on hold. Last dose eliquis 4/17 21:00.  Hg 12.3, PTLC 431.   Goal of Therapy:  Heparin level 0.3-0.7 units/ml aPTT 66-102 seconds Monitor platelets by anticoagulation protocol: Yes   Plan:  Start heparin drip 12 hours after last dose eliquis at 1450 units/hr Check aPTT and heparin level 6-8 hours after start then daily Monitor for bleeding complications  Willamae Demby [place] 07/09/2020,2:52 AM

## 2020-07-10 ENCOUNTER — Inpatient Hospital Stay (HOSPITAL_COMMUNITY): Payer: Medicare Other

## 2020-07-10 DIAGNOSIS — J9601 Acute respiratory failure with hypoxia: Secondary | ICD-10-CM

## 2020-07-10 LAB — GLUCOSE, CAPILLARY
Glucose-Capillary: 111 mg/dL — ABNORMAL HIGH (ref 70–99)
Glucose-Capillary: 112 mg/dL — ABNORMAL HIGH (ref 70–99)
Glucose-Capillary: 117 mg/dL — ABNORMAL HIGH (ref 70–99)
Glucose-Capillary: 124 mg/dL — ABNORMAL HIGH (ref 70–99)
Glucose-Capillary: 80 mg/dL (ref 70–99)
Glucose-Capillary: 80 mg/dL (ref 70–99)
Glucose-Capillary: 88 mg/dL (ref 70–99)

## 2020-07-10 LAB — MAGNESIUM: Magnesium: 1.7 mg/dL (ref 1.7–2.4)

## 2020-07-10 LAB — BASIC METABOLIC PANEL
Anion gap: 8 (ref 5–15)
Anion gap: 11 (ref 5–15)
BUN: 25 mg/dL — ABNORMAL HIGH (ref 8–23)
BUN: 25 mg/dL — ABNORMAL HIGH (ref 8–23)
CO2: 28 mmol/L (ref 22–32)
CO2: 26 mmol/L (ref 22–32)
Calcium: 9 mg/dL (ref 8.9–10.3)
Calcium: 9.1 mg/dL (ref 8.9–10.3)
Chloride: 106 mmol/L (ref 98–111)
Chloride: 104 mmol/L (ref 98–111)
Creatinine, Ser: 2.03 mg/dL — ABNORMAL HIGH (ref 0.61–1.24)
Creatinine, Ser: 2.04 mg/dL — ABNORMAL HIGH (ref 0.61–1.24)
GFR, Estimated: 34 mL/min — ABNORMAL LOW (ref >60)
GFR, Estimated: 34 mL/min — ABNORMAL LOW (ref >60)
Glucose, Bld: 110 mg/dL — ABNORMAL HIGH (ref 70–99)
Glucose, Bld: 105 mg/dL — ABNORMAL HIGH (ref 70–99)
Potassium: 3.5 mmol/L (ref 3.5–5.1)
Potassium: 4.2 mmol/L (ref 3.5–5.1)
Sodium: 142 mmol/L (ref 135–145)
Sodium: 141 mmol/L (ref 135–145)

## 2020-07-10 LAB — HEPARIN LEVEL (UNFRACTIONATED): Heparin Unfractionated: >1.1 IU/mL — ABNORMAL HIGH (ref 0.30–0.70)

## 2020-07-10 LAB — APTT: aPTT: 136 seconds — ABNORMAL HIGH (ref 24–36)

## 2020-07-10 LAB — PHOSPHORUS: Phosphorus: 3.2 mg/dL (ref 2.5–4.6)

## 2020-07-10 LAB — PROCALCITONIN: Procalcitonin: 1.14 ng/mL

## 2020-07-10 MED ORDER — ACETAMINOPHEN 325 MG PO TABS
650.0000 mg | ORAL_TABLET | ORAL | Status: DC | PRN
  Administered 2020-07-10 – 2020-07-11 (×3): 650 mg via ORAL
  Filled 2020-07-10 (×2): qty 2

## 2020-07-10 MED ORDER — DOCUSATE SODIUM 50 MG/5ML PO LIQD: 100.0000 mg | Freq: Two times a day (BID) | ORAL | Status: DC | PRN

## 2020-07-10 MED ORDER — POTASSIUM CHLORIDE CRYS ER 20 MEQ PO TBCR
40.0000 meq | EXTENDED_RELEASE_TABLET | Freq: Once | ORAL | Status: AC
  Administered 2020-07-10: 40 meq via ORAL
  Filled 2020-07-10: qty 2

## 2020-07-10 MED ORDER — ASPIRIN 81 MG PO CHEW
81.0000 mg | CHEWABLE_TABLET | Freq: Every day | ORAL | Status: DC
  Administered 2020-07-10 – 2020-07-12 (×3): 81 mg via ORAL
  Filled 2020-07-10 (×3): qty 1

## 2020-07-10 MED ORDER — POLYETHYLENE GLYCOL 3350 17 G PO PACK
17.0000 g | PACK | Freq: Every day | ORAL | Status: DC
  Administered 2020-07-10 – 2020-07-12 (×3): 17 g via ORAL
  Filled 2020-07-10 (×3): qty 1

## 2020-07-10 MED ORDER — APIXABAN 5 MG PO TABS
5.0000 mg | ORAL_TABLET | Freq: Two times a day (BID) | ORAL | Status: DC
  Administered 2020-07-10 – 2020-07-12 (×5): 5 mg via ORAL
  Filled 2020-07-10 (×5): qty 1

## 2020-07-10 MED ORDER — MAGNESIUM SULFATE 2 GM/50ML IV SOLN
2.0000 g | Freq: Once | INTRAVENOUS | Status: AC
  Administered 2020-07-10: 2 g via INTRAVENOUS
  Filled 2020-07-10: qty 50

## 2020-07-10 MED ORDER — DOCUSATE SODIUM 100 MG PO CAPS
100.0000 mg | ORAL_CAPSULE | Freq: Two times a day (BID) | ORAL | Status: DC
  Administered 2020-07-10 – 2020-07-12 (×5): 100 mg via ORAL
  Filled 2020-07-10 (×5): qty 1

## 2020-07-10 MED ORDER — METOPROLOL SUCCINATE ER 25 MG PO TB24
12.5000 mg | ORAL_TABLET | Freq: Every day | ORAL | Status: DC
  Administered 2020-07-12: 12.5 mg via ORAL
  Filled 2020-07-10 (×3): qty 1

## 2020-07-10 MED ORDER — AMIODARONE HCL 200 MG PO TABS
200.0000 mg | ORAL_TABLET | Freq: Every day | ORAL | Status: DC
  Administered 2020-07-10 – 2020-07-12 (×3): 200 mg via ORAL
  Filled 2020-07-10 (×3): qty 1

## 2020-07-10 MED ORDER — FUROSEMIDE 10 MG/ML IJ SOLN
40.0000 mg | Freq: Two times a day (BID) | INTRAMUSCULAR | Status: DC
  Administered 2020-07-10 – 2020-07-11 (×2): 40 mg via INTRAVENOUS
  Filled 2020-07-10 (×2): qty 4

## 2020-07-10 MED ORDER — ATORVASTATIN CALCIUM 40 MG PO TABS
40.0000 mg | ORAL_TABLET | Freq: Every day | ORAL | Status: DC
  Administered 2020-07-10 – 2020-07-12 (×3): 40 mg via ORAL
  Filled 2020-07-10 (×3): qty 1

## 2020-07-10 MED ORDER — LOSARTAN POTASSIUM 25 MG PO TABS
12.5000 mg | ORAL_TABLET | Freq: Every day | ORAL | Status: DC
  Administered 2020-07-12: 12.5 mg via ORAL
  Filled 2020-07-10 (×3): qty 1

## 2020-07-10 MED ORDER — POLYETHYLENE GLYCOL 3350 17 G PO PACK: 17.0000 g | PACK | Freq: Every day | ORAL | Status: DC | PRN

## 2020-07-10 NOTE — Evaluation (Signed)
Physical Therapy Evaluation Patient Details Name: Charles Mcdonald MRN: [id number] DOB: July 16, 1945 Today's Date: 07/10/2020   History of Present Illness  Pt is a 75 y/o male admitted 4/17 secondary to chest pain and SOB. Pt became unresponsive during transport to hosptial and had acute hypoxic respiratory failure. Pt was intubated on 4/17 and extubated on 4/18. Pt with PMH of VT s/p ICD, VT storm, CKD, a fib, CHF, and CAD.  Clinical Impression  Pt admitted secondary to problem above with deficits below. Pt requiring min guard A for mobility using RW. Noted mild unsteadiness during gait, but no overt LOB. Educated about walking program to perform. Pt reports he feels a little weaker, but not far off from baseline. Anticipate pt will progress well and will not require follow up PT at d/c. Will continue to follow acutely to maximize functional mobility independence and safety.     Follow Up Recommendations No PT follow up    Equipment Recommendations  None recommended by PT    Recommendations for Other Services       Precautions / Restrictions Precautions Precautions: Fall Restrictions Weight Bearing Restrictions: No      Mobility  Bed Mobility Overal bed mobility: Modified Independent                  Transfers Overall transfer level: Needs assistance Equipment used: Rolling walker (2 wheeled) Transfers: Sit to/from Stand Sit to Stand: Min guard         General transfer comment: Min guard for safety.  Ambulation/Gait Ambulation/Gait assistance: Min guard Gait Distance (Feet): 120 Feet Assistive device: Rolling walker (2 wheeled) Gait Pattern/deviations: Step-through pattern;Decreased stride length Gait velocity: Decreased   General Gait Details: Mild unsteadiness noted and required min guard A with use of RW. No overt LOB noted. Educated about using RW at home for increased safety.  Stairs            Wheelchair Mobility    Modified Rankin (Stroke  Patients Only)       Balance Overall balance assessment: Needs assistance Sitting-balance support: No upper extremity supported;Feet supported Sitting balance-Leahy Scale: Good     Standing balance support: Bilateral upper extremity supported;During functional activity Standing balance-Leahy Scale: Poor Standing balance comment: REliant on BUE support                             Pertinent Vitals/Pain Pain Assessment: No/denies pain    Home Living Family/patient expects to be discharged to:: Private residence Living Arrangements: Spouse/significant other Available Help at Discharge: Family Type of Home: House Home Access: Ramped entrance     Home Layout: One level Home Equipment: Environmental consultant - 2 wheels;Cane - single point      Prior Function Level of Independence: Independent with assistive device(s)         Comments: Reports using cane for mobility, but sometimes can go without. Reporting independence with ADL tasks.     Hand Dominance        Extremity/Trunk Assessment   Upper Extremity Assessment Upper Extremity Assessment: Defer to OT evaluation    Lower Extremity Assessment Lower Extremity Assessment: Generalized weakness    Cervical / Trunk Assessment Cervical / Trunk Assessment: Normal  Communication   Communication: No difficulties  Cognition Arousal/Alertness: Awake/alert Behavior During Therapy: WFL for tasks assessed/performed Overall Cognitive Status: Within Functional Limits for tasks assessed  General Comments General comments (skin integrity, edema, etc.): Educated about walking program to perform at home.    Exercises     Assessment/Plan    PT Assessment Patient needs continued PT services  PT Problem List Decreased strength;Decreased balance;Decreased mobility       PT Treatment Interventions DME instruction;Gait training;Stair training;Functional mobility  training;Therapeutic activities;Therapeutic exercise;Balance training;Patient/family education    PT Goals (Current goals can be found in the Care Plan section)  Acute Rehab PT Goals Patient Stated Goal: to go home PT Goal Formulation: With patient Time For Goal Achievement: 07/24/20 Potential to Achieve Goals: Good    Frequency Min 3X/week   Barriers to discharge        Co-evaluation               AM-PAC PT "6 Clicks" Mobility  Outcome Measure Help needed turning from your back to your side while in a flat bed without using bedrails?: None Help needed moving from lying on your back to sitting on the side of a flat bed without using bedrails?: None Help needed moving to and from a bed to a chair (including a wheelchair)?: A Little Help needed standing up from a chair using your arms (e.g., wheelchair or bedside chair)?: A Little Help needed to walk in hospital room?: A Little Help needed climbing 3-5 steps with a railing? : A Little 6 Click Score: 20    End of Session Equipment Utilized During Treatment: Gait belt Activity Tolerance: Patient tolerated treatment well Patient left: in bed;with call bell/phone within reach;with family/visitor present Nurse Communication: Mobility status PT Visit Diagnosis: Unsteadiness on feet (R26.81);Muscle weakness (generalized) (M62.81)    Time: [id number] PT Time Calculation (min) (ACUTE ONLY): 17 min   Charges:   PT Evaluation $PT Eval Low Complexity: 1 Low          Charles Mcdonald, Charles Mcdonald  Acute Rehabilitation Services  Pager: [phone] Office: [phone]   Charles Mcdonald 07/10/2020, 2:46 PM

## 2020-07-10 NOTE — Progress Notes (Addendum)
[facility] Progress Note Patient Name: Charles Mcdonald DOB: Dec 15, 1945 MRN: [id number]   Date of Service  07/10/2020  HPI/Events of Note  Notified of K 3.5, creatinine 2.03 Patient having vague leg pain. Seen asleep  eICU Interventions  Ordered K 40 meqs PO x 1  Follow up: Patient now awake still with leg pain described as around the heel. He states this is chronic for him for which he takes Tylenol  Present palpable pulse  Tylenol ordered      Intervention Category Intermediate Interventions: Electrolyte abnormality - evaluation and management  Judd Lien 07/10/2020, 3:52 AM

## 2020-07-10 NOTE — Progress Notes (Addendum)
Occupational Therapy Evaluation  PTA pt lives at home with his wife and is modified independent with use of cane/rollator and wife assists with bathing as needed. Pt drives however his daughter (who is a Marine scientist) manages his medication. Pt able to ambulate @ 240 ft with 1 rest break with VSS on RA (probe placed on ear). Pt weighs himself daily but does not monitor his fluid intake or "pay that much attention" to salt intake. Pt states he gets dizzy at times when he bends over to complete his self care. Will follow acutely to educate on use of AE/compensatory strategies for ADL, CHF management and lifestyle, energy conservation and strategies to reduce risk of falls. Pt/family very appreciative.  Encourage ambulation with rollator.   07/10/20 1500  OT Visit Information  Last OT Received On 07/10/20  Assistance Needed +1  History of Present Illness Pt is a 75 y/o male admitted 4/17 secondary to chest pain and SOB. Pt became unresponsive during transport to hosptial and had acute hypoxic respiratory failure. Pt was intubated on 4/17 and extubated on 4/18. Pt with PMH of VT s/p ICD, VT storm, CKD, a fib, CHF, and CAD.  Precautions  Precautions Fall  Home Living  Family/patient expects to be discharged to: Private residence  Living Arrangements Spouse/significant other  Available Help at Discharge Family  Type of [place] One level  Bathroom Shower/Tub Tub/shower unit  [place] height  Bathroom Accessibility Yes  How Accessible Accessible via walker (usually parks rollator outside door adn walks into bathroom)  [place] - 2 wheels;Cane - single point  Prior Function  Level of Independence Independent with assistive device(s)  Comments Reports using cane and rollator for mobility, but sometimes can go without. Wife assists with bathing as needed; pt gets dizzy at times when he leans over  Communication  Communication No  difficulties  Pain Assessment  Pain Assessment No/denies pain  Cognition  Arousal/Alertness Awake/alert  Behavior During Therapy WFL for tasks assessed/performed  Overall Cognitive Status Within Functional Limits for tasks assessed  Upper Extremity Assessment  Upper Extremity Assessment Overall WFL for tasks assessed  Lower Extremity Assessment  Lower Extremity Assessment Defer to PT evaluation  Cervical / Trunk Assessment  Cervical / Trunk Assessment Normal  ADL  Overall ADL's  Needs assistance/impaired  Grooming Set up;Standing  Upper Body Bathing Set up;Sitting  Lower Body Bathing Minimal assistance;Sit to/from stand  Upper Body Dressing  Set up;Sitting  Lower Body Dressing Minimal assistance;Sit to/from Retail buyer Ambulation;Min guard (rollator)  Toileting - Clothing Manipulation Details (indicate cue type and reason) uses urinal at baseline; wife does ostomy care  Functional mobility during ADLs Min guard ($wheeled walker)  Vision- History  Baseline Vision/History Wears glasses  Bed Mobility  Overal bed mobility Modified Independent  Transfers  Overall transfer level Needs assistance  Equipment used Rolling walker (2 wheeled)  Transfers Sit to/from Stand  Sit to [employer] guard  General transfer comment Min guard for safety.  Balance  Overall balance assessment Needs assistance  Sitting-balance support No upper extremity supported;Feet supported  Sitting balance-Leahy Scale Good  Standing balance support Bilateral upper extremity supported;During functional activity  Standing balance-Leahy Scale Poor  Standing balance comment REliant on BUE support  General Comments  General comments (skin integrity, edema, etc.) Pt weighs himslef daily; does not measure fluid intake; uses some canned food  Exercises  Exercises Other exercises  Other Exercises  Other  Exercises incentive spriometer x 5 - able to pull 716m  OT - End of Session  Equipment Utilized  During Treatment Gait belt;Other (comment) (4 wheeled walker)  Activity Tolerance Patient tolerated treatment well  Patient left in bed;with call bell/phone within reach;with bed alarm set;with family/visitor present;Other (comment) (bed in chair position; wife sitting in recliner)  Nurse Communication Mobility status;Other (comment) (encourage ambulation)  OT Assessment  OT Recommendation/Assessment Patient needs continued OT Services  OT Visit Diagnosis Unsteadiness on feet (R26.81);Muscle weakness (generalized) (M62.81)  OT Problem List Decreased strength;Decreased activity tolerance;Decreased knowledge of use of DME or AE;Cardiopulmonary status limiting activity  OT Plan  OT Frequency (ACUTE ONLY) Min 2X/week  OT Treatment/Interventions (ACUTE ONLY) Self-care/ADL training;Therapeutic exercise;Energy conservation;DME and/or AE instruction;Therapeutic activities;Patient/family education;Balance training  AM-PAC OT "6 Clicks" Daily Activity Outcome Measure (Version 2)  Help from another person eating meals? 4  Help from another person taking care of personal grooming? 3  Help from another person toileting, which includes using toliet, bedpan, or urinal? 3  Help from another person bathing (including washing, rinsing, drying)? 3  Help from another person to put on and taking off regular upper body clothing? 3  Help from another person to put on and taking off regular lower body clothing? 3  6 Click Score 19  OT Recommendation  Follow Up Recommendations Supervision - Intermittent  OT Equipment None recommended by OT  Individuals Consulted  Consulted and Agree with Results and Recommendations Patient;Family member/caregiver  Family Member Consulted wife/daughter  Acute Rehab OT Goals  Patient Stated Goal to go home  OT Goal Formulation With patient  Time For Goal Achievement 07/24/20  Potential to Achieve Goals Good  OT Time Calculation  OT Start Time (ACUTE ONLY) 1447  OT Stop Time  (ACUTE ONLY) 1520  OT Time Calculation (min) 33 min  OT General Charges  $OT Visit 1 Visit  OT Evaluation  $OT Eval Moderate Complexity 1 Mod  OT Treatments  $Self Care/Home Management  8-22 mins  Written Expression  Dominant Hand Right  HMaurie Boettcher OT/L   Acute OT Clinical Specialist AHuguleyPager [phone] [phone]

## 2020-07-10 NOTE — Progress Notes (Signed)
Pt arrived to rm 21 from [place]. Initiated tele. CHG wipe provided. VSS. Oriented pt to the unit. Call bell within reach.   Lavenia Atlas, RN

## 2020-07-10 NOTE — Progress Notes (Signed)
ANTICOAGULATION CONSULT NOTE - Follow Up Consult  Pharmacy Consult for heparin Indication: atrial fibrillation  No Known Allergies  Patient Measurements: Height: 6' (182.9 cm) Weight: 90.7 kg (199 lb 15.3 oz) IBW/kg (Calculated) : 77.6 Heparin Dosing Weight: 91.2 kg  Vital Signs: Temp: 97.8 F (36.6 C) (04/19 0354) Temp Source: Oral (04/19 0354) BP: 100/52 (04/19 0300) Pulse Rate: 65 (04/19 0300)  Labs: Recent Labs    07/08/20 2356 07/09/20 0030 07/09/20 0046 07/09/20 0156 07/09/20 0320 07/09/20 0437 07/09/20 0736 07/09/20 1436 07/09/20 1724 07/10/20 0212  HGB 12.3*   < > 10.9*  --  10.9*  --  11.4*  --   --   --   HCT 40.3   < > 32.0*  --  32.0*  --  36.7*  --   --   --   PLT 431*  --   --   --   --   --  339  --   --   --   APTT  --   --   --   --   --   --   --   --  39* 136*  LABPROT  --   --   --   --   --  17.9*  --   --   --   --   INR  --   --   --   --   --  1.5*  --   --   --   --   HEPARINUNFRC  --   --   --   --   --   --   --   --  >1.10* >1.10*  CREATININE 2.14*  --   --   --   --   --  2.10* 1.98*  --  2.03*  TROPONINIHS 26*  --   --  47*  --   --   --   --   --   --    < > = values in this interval not displayed.    Estimated Creatinine Clearance: 36.1 mL/min (A) (by C-G formula based on SCr of 2.03 mg/dL (H)).   Assessment: 27 YOM who presented on with CP, possible STEMI. The patient was on Apixaban PTA for hx Afib - now on hold and bridging with Heparin.   Heparin level >1.1 due to recent apixaban use. PTT now up to supratherapeutic (136 sec). RN notes that labs drawn from L wrist and heparin running in R arm. No bleeding noted.  Goal of Therapy:  Heparin level 0.3-0.7 units/ml aPTT 66-102 seconds Monitor platelets by anticoagulation protocol: Yes   Plan:  Decrease Heparin to 1550 units/hr F/u PTT in 8 hours  Thank you for allowing pharmacy to be a part of this patient's care.  Sherlon Handing, PharmD, BCPS Please see amion for complete  clinical pharmacist phone list 07/10/2020 3:58 AM

## 2020-07-10 NOTE — Progress Notes (Signed)
NAME:  Charles Mcdonald, MRN:  [id number], DOB:  Dec 08, 1945, LOS: 1 ADMISSION DATE:  07/08/2020, CONSULTATION DATE:  07/09/2020 REFERRING MD:  Sedonia Small, CHIEF COMPLAINT:  Short of breath   History of Present Illness:  75 year old man with PMHx significant for CAD, CHF brought in for possible STEMI after sudden onset of chest pain and SOB. Per his wife at bedside, patient had been increasingly SOB for several weeks prior to admission. En route to the hospital, he became unresponsive, developed VT and was hypoxemia in the setting of AMS.   IN ED:  "EKG repeated and reviewed by Cardiology at the bedside, given clinical picture namely the pulmonary edema on chest x-ray, the edema to the lower extremities, history of CHF, lack of true STEMI criteria and canceled code STEMI, favoring CHF exacerbation.  Hemodynamically stable, on the vent, will admit to intensivist service. Plan for Lasix, sedation with propofol."  Of note, patient was recently seen by cardiology 07/02/20 for hospital follow up after VT storm (>100 ICD shocks) in the setting of severe hypokalemia; weight was up 17lb but patient did not have other symptoms of CHF exacerbation at that visit and daily Lasix was initiated.  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 4/17 Presented to ED, intubated 2350 . 4/18 Admitted to ICU, good response to diuresis, extubated to [place] ~1047 . 4/19 Maintaining O2 sats on 1LNC, off pressors, continue diuresis per Cards, transition heparin to Eliquis, stable for transfer out of ICU Interim History / Subjective:  Feeling well this AM Breathing feels "normal" on 1LNC Denies fever/chills,  CP/SOB Sat up in the chair at bedside 4/18, tolerated well Eating ok, has not walked yet Hep gtt >> Eliquis today per Cards [facility] for floor transfer  Objective   Blood pressure (!) 102/56, pulse 73, temperature 98.5 F (36.9 C), temperature source Oral, resp. rate (!) 22, height 6' (1.829 m), weight 85.9 kg, SpO2 94 %.        Intake/Output Summary (Last 24 hours) at 07/10/2020 0750 Last data filed at 07/10/2020 0308 Gross per 24 hour  Intake 1191 ml  Output 3450 ml  Net -2259 ml   Filed Weights   07/08/20 2351 07/09/20 0405 07/10/20 0450  Weight: 91.2 kg 90.7 kg 85.9 kg   Physical Examination: General: Chronically ill-appearing male in NAD. HEENT: [place]/AT, anicteric sclera, PERRL, moist mucous membranes. [place] in place. Neuro: Awake, oriented x 4. Responds to verbal stimuli. Following commands consistently. Moves all 4 extremities spontaneously. CV: RRR, no m/g/r. PULM: Breathing even and unlabored on 1LNC. Lung fields with bibasilar crackles, L > R. GI: Soft, nontender, nondistended. Normoactive bowel sounds. Extremities: Bilateral symmetric 1-2+ LE edema noted. Skin: Warm/dry, no rashes noted.  Labs/imaging that I have personally reviewed: (right click and "Reselect all SmartList Selections" daily)  H&H stable K 3.5, Bicarb 28 Cr 2.0 (1.98) Mg 1.7 (replete) Glucoses 80-117 last 24H CXR with improved aeration of R lung, L with ?mild L pleural effusion  Resolved Hospital Problem list     Assessment & Plan:  Acute hypoxemic respiratory failure Per Cardiology, device interrogated and no evidence of VT/other arrhythmia contributing to presenting symptoms; feel this was  more likely worsening HF over the last several weeks. - Oxygen requirements significantly improved - Extubated 4/18 to [place] - Wean O2 as able for O2 sat > 90% - Stable for transfer to floor today  CAD History of VT storm LBBB History of anterior MI (2002, s/p stenting to proximal LAD) with additional NSTEMI  (2017). Coronary anatomy was essentially unchanged on cardiac catheterization and an unsuccessful attempt was made at a PCI of the LAD. On ASA, Lipitor and metoprolol at home. Trops neg. - Per cardiology, one additional day of IV diuresis (Lasix '40mg'$  IV BID) - Monitor electrolytes - May benefit from repeat R/LHC at some point, once clinically more stable  Atrial fibrilation On eliquis at home.    - Resume Eliquis today - Discontinue heparin  AKI on chronic kidney disease - Trend BMP with diuresis - Replete electrolytes as indicated - Monitor I&Os - Avoid nephrotoxic agents as able  Hypothyroidism TSH a bit high, T4 normal, only just started synthroid - Continue Synthroid  DM - SSI PRN  Best practice (right click and "Reselect all SmartList Selections" daily)  Diet:  Oral Pain/Anxiety/Delirium protocol (if indicated): No VAP protocol (if indicated): Not indicated DVT prophylaxis: Systemic AC GI prophylaxis: PPI Glucose control:  SSI Yes Central venous access:  N/A Arterial line:  N/A Foley:  N/A Mobility:  OOB  PT consulted: Yes Last date of multidisciplinary goals of care discussion: N/A Code Status:  full code Disposition: Transfer to floor today (progressive vs. Cardiac telemetry)  Critical Care Time: N/A  Rhae Lerner [place] [facility] 07/10/20 8:15 AM  Please see [email or web address] for pager details.

## 2020-07-10 NOTE — Progress Notes (Signed)
SLP Cancellation Note  Patient Details Name: Charles Mcdonald MRN: [id number] DOB: [birth year]   Cancelled treatment:       Reason Eval/Treat Not Completed: SLP screened chart and spoke with RN. Pt was intubated for less than one day and was started on a diet yesterday. RN reports no difficulties with PO intake so far. In discussion with him, will hold eval for now - please reorder if needed.     Osie Bond., M.A. [place] [facility] Pager [phone] Office [phone]  07/10/2020, 8:51 AM

## 2020-07-10 NOTE — Progress Notes (Signed)
Progress Note  Patient Name: Charles Mcdonald Date of Encounter: 07/10/2020  [facility] Cardiologist: Sanda Klein, MD   Subjective   Feels better. Breathing improved. No chest pain.  Inpatient Medications    Scheduled Meds: . amiodarone  200 mg Per Tube Daily  . aspirin  81 mg Per Tube Daily  . atorvastatin  40 mg Per Tube Daily  . Chlorhexidine Gluconate Cloth  6 each Topical Daily  . docusate  100 mg Per Tube BID  . furosemide  40 mg Intravenous Q12H  . insulin aspart  0-20 Units Subcutaneous Q4H  . losartan  12.5 mg Oral Daily  . metoprolol succinate  12.5 mg Oral Daily  . pantoprazole (PROTONIX) IV  40 mg Intravenous QHS  . polyethylene glycol  17 g Per Tube Daily   Continuous Infusions: . sodium chloride 10 mL/hr at 07/10/20 0300  . magnesium sulfate bolus IVPB     PRN Meds: acetaminophen, docusate, ipratropium-albuterol, ondansetron (ZOFRAN) IV, polyethylene glycol   Vital Signs    Vitals:   07/10/20 0450 07/10/20 0500 07/10/20 0600 07/10/20 0728  BP:  107/67 (!) 102/56   Pulse:  76 73   Resp:  (!) 25 (!) 22   Temp:    98.5 F (36.9 C)  TempSrc:    Oral  SpO2:  94% 94%   Weight: 85.9 kg     Height:        Intake/Output Summary (Last 24 hours) at 07/10/2020 0735 Last data filed at 07/10/2020 0308 Gross per 24 hour  Intake 1191 ml  Output 3450 ml  Net -2259 ml   Last 3 Weights 07/10/2020 07/09/2020 07/08/2020  Weight (lbs) 189 lb 6 oz 199 lb 15.3 oz 201 lb 1 oz  Weight (kg) 85.9 kg 90.7 kg 91.2 kg      Telemetry    NSR - Personally Reviewed  ECG    N/A - Personally Reviewed  Physical Exam   GEN: No acute distress.   Neck: Mild JVD Cardiac: RRR, no murmurs, rubs, or gallops.  Respiratory: Clear to auscultation bilaterally. GI: Soft, nontender, non-distended  MS: Tr edema on the right 1+ on left; No deformity. Neuro:  Nonfocal  Psych: Normal affect   Labs    High Sensitivity Troponin:   Recent Labs  Lab 06/16/20 1637  06/16/20 1808 07/08/20 2356 07/09/20 0156  TROPONINIHS 1,214* 1,810* 26* 47*      Chemistry Recent Labs  Lab 07/08/20 2356 07/09/20 0030 07/09/20 0736 07/09/20 1436 07/10/20 0212  NA 142   < > 143 143 142  K 4.2   < > 4.6 4.2 3.5  CL 111  --  108 106 106  CO2 20*  --  21* 25 28  GLUCOSE 220*  --  151* 105* 110*  BUN 24*  --  24* 23 25*  CREATININE 2.14*  --  2.10* 1.98* 2.03*  CALCIUM 9.4  --  9.7 9.7 9.0  PROT 6.8  --   --   --   --   ALBUMIN 3.4*  --   --   --   --   AST 21  --   --   --   --   ALT 16  --   --   --   --   ALKPHOS 124  --   --   --   --   BILITOT 0.4  --   --   --   --   GFRNONAA 32*  --  33* 35* 34*  ANIONGAP 11  --  '14 12 8   '$ < > = values in this interval not displayed.     Hematology Recent Labs  Lab 07/08/20 2356 07/09/20 0030 07/09/20 0046 07/09/20 0320 07/09/20 0736  WBC 13.9*  --   --   --  14.6*  RBC 4.36  --   --   --  4.10*  HGB 12.3*   < > 10.9* 10.9* 11.4*  HCT 40.3   < > 32.0* 32.0* 36.7*  MCV 92.4  --   --   --  89.5  MCH 28.2  --   --   --  27.8  MCHC 30.5  --   --   --  31.1  RDW 17.7*  --   --   --  17.5*  PLT 431*  --   --   --  339   < > = values in this interval not displayed.    BNP Recent Labs  Lab 07/08/20 2356  BNP 2,369.9*     DDimer No results for input(s): DDIMER in the last 168 hours.   Radiology    DG Chest Port 1 View  Result Date: 07/10/2020 CLINICAL DATA:  ARDS. EXAM: PORTABLE CHEST 1 VIEW COMPARISON:  07/09/2020. FINDINGS: Interim extubation and removal of NG tube. Cardiac pacer with lead tips over the right atrium right ventricle. Cardiomegaly. Diffuse bilateral pulmonary infiltrates/edema again noted. Slight improvement in aeration of the right lung noted. A small to moderate left pleural effusion cannot be excluded. No pneumothorax. IMPRESSION: 1.  Interim extubation and removal of NG tube. 2.  Cardiac pacer in stable position.  Cardiomegaly again noted. 3. Diffuse bilateral pulmonary  infiltrates/edema. Slight improvement in aeration right lung. Small to moderate left pleural effusion cannot be excluded. Electronically Signed   By: Marcello Moores  Register   On: 07/10/2020 06:38   DG Chest Port 1 View  Result Date: 07/09/2020 CLINICAL DATA:  Shortness of breath, check endotracheal tube placement EXAM: PORTABLE CHEST 1 VIEW COMPARISON:  06/16/2020 FINDINGS: Endotracheal tube and gastric catheter are noted in satisfactory position. Stable defibrillator is seen. Patchy airspace opacities are noted bilaterally right slightly greater than left. These changes are new from the prior exam and may simply represent pulmonary edema although multifocal pneumonia deserves consideration as well. No bony abnormality is seen. IMPRESSION: Tubes and lines as described above. Patchy airspace opacity bilaterally as described. Edema versus multifocal infiltrate. Electronically Signed   By: Inez Catalina M.D.   On: 07/09/2020 00:17   ECHOCARDIOGRAM LIMITED  Result Date: 07/09/2020    ECHOCARDIOGRAM REPORT   Patient Name:   Charles Mcdonald Date of Exam: 07/09/2020 Medical Rec #:  [id number]       Height:       72.0 in Accession #:    [id number]      Weight:       200.0 lb Date of Birth:  06-07-1945      BSA:          2.130 m Patient Age:    75 years        BP:           104/66 mmHg Patient Gender: M               HR:           82 bpm. Exam Location:  Inpatient Procedure: Limited Echo, Limited Color Doppler, Color Doppler and Intracardiac  Opacification Agent Indications:    CHF-Acute Systolic [id number]  History:        Patient has prior history of Echocardiogram examinations, most                 recent 06/17/2020. Arrythmias:Atrial Fibrillation; Risk                 Factors:Dyslipidemia and Hypertension.  Sonographer:    Mikki Santee RDCS (AE) Referring Phys: [id number] [place]  1. Left ventricular ejection fraction, by estimation, is 20 to 25%. The left ventricle has severely decreased  function. The left ventricle demonstrates regional wall motion abnormalities.         There is akinesis of the entire anteroseptal, anterior LV walls and apex. There is severe hypokinesis of all inferoseptal and inferior LV walls. The inferolateral wall is moderately hypokinetic. The left ventricular internal cavity size was moderately dilated. Left ventricular diastolic parameters are consistent with Grade II diastolic dysfunction (pseudonormalization).  2. Right ventricular systolic function is mildly reduced. The right ventricular size is normal. There is moderately elevated pulmonary artery systolic pressure. The estimated right ventricular systolic pressure is 123456 mmHg.  3. Left atrial size was moderately dilated.  4. The mitral valve is normal in structure. Trivial mitral valve regurgitation. No evidence of mitral stenosis.  5. The aortic valve is tricuspid. Aortic valve regurgitation is not visualized. No aortic stenosis is present.  6. The inferior vena cava is normal in size with <50% respiratory variability, suggesting right atrial pressure of 8 mmHg. Comparison(s): Compared to prior study on 06/16/20, the PASP is now 50mHg (previously 35mg). FINDINGS  Left Ventricle: Left ventricular ejection fraction, by estimation, is 20 to 25%. The left ventricle has severely decreased function. The left ventricle demonstrates regional wall motion abnormalities. Definity contrast agent was given IV to delineate the left ventricular endocardial borders. There is akinesis of the entire anteroseptal, anterior LV walls and apex. There is severe hypokinesis of all inferoseptal and inferior LV walls. The inferolateral wall is moderately hypokinetic. The left ventricular internal cavity size was moderately dilated. There is no left ventricular hypertrophy. Left ventricular diastolic parameters are consistent with Grade II diastolic dysfunction (pseudonormalization). Right Ventricle: The right ventricular size is normal.  No increase in right ventricular wall thickness. Right ventricular systolic function is mildly reduced. There is moderately elevated pulmonary artery systolic pressure. The tricuspid regurgitant velocity is 3.43 m/s, and with an assumed right atrial pressure of 8 mmHg, the estimated right ventricular systolic pressure is 55123456mHg. Left Atrium: Left atrial size was moderately dilated. Right Atrium: Right atrial size was normal in size. Pericardium: There is no evidence of pericardial effusion. Mitral Valve: The mitral valve is normal in structure. Trivial mitral valve regurgitation. No evidence of mitral valve stenosis. Tricuspid Valve: The tricuspid valve is normal in structure. Tricuspid valve regurgitation is trivial. Aortic Valve: The aortic valve is tricuspid. Aortic valve regurgitation is not visualized. No aortic stenosis is present. Pulmonic Valve: The pulmonic valve was not well visualized. Pulmonic valve regurgitation is not visualized. Aorta: The aortic root and ascending aorta are structurally normal, with no evidence of dilitation. Venous: The inferior vena cava is normal in size with less than 50% respiratory variability, suggesting right atrial pressure of 8 mmHg. IAS/Shunts: No atrial level shunt detected by color flow Doppler. Additional Comments: A device lead is visualized. There is a small pleural effusion in the left lateral region.  LEFT VENTRICLE PLAX 2D LVIDd:  6.20 cm  Diastology LVIDs:         5.00 cm  LV e' medial:    3.73 cm/s LV PW:         0.90 cm  LV E/e' medial:  27.9 LV IVS:        0.60 cm  LV e' lateral:   7.62 cm/s LVOT diam:     2.20 cm  LV E/e' lateral: 13.6 LVOT Area:     3.80 cm  LEFT ATRIUM           Index LA diam:      4.50 cm 2.11 cm/m LA Vol (A4C): 79.1 ml 37.13 ml/m   AORTA Ao Root diam: 3.50 cm MITRAL VALVE                TRICUSPID VALVE MV Area (PHT): 5.02 cm     TR Peak grad:   47.1 mmHg MV Decel Time: 151 msec     TR Vmax:        343.00 cm/s MV E velocity:  104.00 cm/s MV A velocity: 92.40 cm/s   SHUNTS MV E/A ratio:  1.13         Systemic Diam: 2.20 cm Gwyndolyn Kaufman MD Electronically signed by Gwyndolyn Kaufman MD Signature Date/Time: 07/09/2020/12:55:44 PM    Final     Cardiac Studies   Echo 07/09/20: IMPRESSIONS    1. Left ventricular ejection fraction, by estimation, is 20 to 25%. The  left ventricle has severely decreased function. The left ventricle  demonstrates regional wall motion abnormalities.      There is akinesis of the entire anteroseptal, anterior LV walls and  apex. There is severe hypokinesis of all inferoseptal and inferior LV  walls. The inferolateral wall is moderately hypokinetic. The left  ventricular internal cavity size was  moderately dilated. Left ventricular diastolic parameters are consistent  with Grade II diastolic dysfunction (pseudonormalization).  2. Right ventricular systolic function is mildly reduced. The right  ventricular size is normal. There is moderately elevated pulmonary artery  systolic pressure. The estimated right ventricular systolic pressure is  123456 mmHg.  3. Left atrial size was moderately dilated.  4. The mitral valve is normal in structure. Trivial mitral valve  regurgitation. No evidence of mitral stenosis.  5. The aortic valve is tricuspid. Aortic valve regurgitation is not  visualized. No aortic stenosis is present.  6. The inferior vena cava is normal in size with <50% respiratory  variability, suggesting right atrial pressure of 8 mmHg.   Comparison(s): Compared to prior study on 06/16/20, the PASP is now 64mHg  (previously 358mg).   Patient Profile     [age].o. 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 SmTamala Julian Assessment & Plan    1. Acute hypoxic respiratory failure: suspect 2/2 acute on chronic CHF exacerbation.  Required intubation on admission, but now extubated on [place]. BNP 2369, CXR with edema. In talking with patient, plan from recent office note was to start lasix but not appear that he ever did. Reports compliance with low Na+ diet. -- [facility]- continued  diuresis  2. Acute on Chronic ICM: has EF of 20-25% with severe CAD that is not amenable to PCI/CABG from cath in 2017.  -- was hypotensive on admission, therefore would add back GDMT as tolerated (Toprol, losartan PTA) -- continue with IV lasix, net - 3.2L. weight is down 12 lbs.  --significant improvement in edema -- still has some infiltrate on CXR -- would continue lasix IV today. Plan to transition to Po tomorrow. -- will start  Low dose losartan and Toprol and watch BP carefully. If able to tolerate losartan can transition to [place] later.   3. Known extensive hx of CAD: no chest pain per his report PTA. Has known extensive CAD from last reported cath in 2017 with occluded LAD, Lcx and RCA not amenable to PCI or CABG. hsTn with low flat trend. EKG with known LBBB -- at this time do not anticipate further ischemic work up, favor treatment for CHF -- will attempt to get cath films from [place] for further review -- on asa, statin  4. PAF: on eliquis PTA, held on admission -- currently on IV heparin, transition back to Eliquis  5. Hx of VT s/p ICD: prior admission with VT storm in the setting of severe hypokalemia -- no VT seen on device interrogation on admission -- continue PO amiodarone '200mg'$  daily  6. Hypothyroidism: TSH 17, normal T4 -- on synthroid   7. CKD stage III: baseline Cr around 1.8. 2.14>>2.10>>2.03 -- monitor with diuresis       For questions or updates, please  contact [place] Please consult [email or web address] for contact info under        Signed, Lochlin Eppinger [place], MD  07/10/2020, 7:35 AM

## 2020-07-11 DIAGNOSIS — J80 Acute respiratory distress syndrome: Secondary | ICD-10-CM

## 2020-07-11 DIAGNOSIS — I509 Heart failure, unspecified: Secondary | ICD-10-CM

## 2020-07-11 LAB — MAGNESIUM: Magnesium: 1.8 mg/dL (ref 1.7–2.4)

## 2020-07-11 LAB — GLUCOSE, CAPILLARY
Glucose-Capillary: 104 mg/dL — ABNORMAL HIGH (ref 70–99)
Glucose-Capillary: 113 mg/dL — ABNORMAL HIGH (ref 70–99)
Glucose-Capillary: 122 mg/dL — ABNORMAL HIGH (ref 70–99)
Glucose-Capillary: 113 mg/dL — ABNORMAL HIGH (ref 70–99)

## 2020-07-11 LAB — BASIC METABOLIC PANEL
Anion gap: 8 (ref 5–15)
Anion gap: 10 (ref 5–15)
BUN: 23 mg/dL (ref 8–23)
BUN: 22 mg/dL (ref 8–23)
CO2: 28 mmol/L (ref 22–32)
CO2: 28 mmol/L (ref 22–32)
Calcium: 8.7 mg/dL — ABNORMAL LOW (ref 8.9–10.3)
Calcium: 9.3 mg/dL (ref 8.9–10.3)
Chloride: 102 mmol/L (ref 98–111)
Chloride: 103 mmol/L (ref 98–111)
Creatinine, Ser: 2.02 mg/dL — ABNORMAL HIGH (ref 0.61–1.24)
Creatinine, Ser: 2.08 mg/dL — ABNORMAL HIGH (ref 0.61–1.24)
GFR, Estimated: 34 mL/min — ABNORMAL LOW (ref >60)
GFR, Estimated: 33 mL/min — ABNORMAL LOW (ref >60)
Glucose, Bld: 124 mg/dL — ABNORMAL HIGH (ref 70–99)
Glucose, Bld: 161 mg/dL — ABNORMAL HIGH (ref 70–99)
Potassium: 3.7 mmol/L (ref 3.5–5.1)
Potassium: 3.7 mmol/L (ref 3.5–5.1)
Sodium: 138 mmol/L (ref 135–145)
Sodium: 141 mmol/L (ref 135–145)

## 2020-07-11 LAB — PHOSPHORUS: Phosphorus: 2.8 mg/dL (ref 2.5–4.6)

## 2020-07-11 MED ORDER — FUROSEMIDE 40 MG PO TABS
40.0000 mg | ORAL_TABLET | Freq: Every day | ORAL | Status: DC
  Administered 2020-07-12: 40 mg via ORAL
  Filled 2020-07-11: qty 1

## 2020-07-11 MED ORDER — ENSURE ENLIVE PO LIQD: 237.0000 mL | Freq: Two times a day (BID) | ORAL | Status: DC

## 2020-07-11 MED ORDER — ADULT MULTIVITAMIN W/MINERALS CH
1.0000 | ORAL_TABLET | Freq: Every day | ORAL | Status: DC
  Administered 2020-07-11 – 2020-07-12 (×2): 1 via ORAL
  Filled 2020-07-11 (×2): qty 1

## 2020-07-11 MED ORDER — INSULIN ASPART 100 UNIT/ML ~~LOC~~ SOLN
0.0000 [IU] | Freq: Three times a day (TID) | SUBCUTANEOUS | Status: DC
  Administered 2020-07-12: 3 [IU] via SUBCUTANEOUS

## 2020-07-11 NOTE — Progress Notes (Signed)
Occupational Therapy Treatment Patient Details Name: Charles Mcdonald MRN: [id number] DOB: 1945-06-18 Today's Date: 07/11/2020    History of present illness Pt is a 75 y/o male admitted 4/17 secondary to chest pain and SOB. Pt became unresponsive during transport to hosptial and had acute hypoxic respiratory failure. Pt was intubated on 4/17 and extubated on 4/18. Pt with PMH of VT s/p ICD, VT storm, CKD, a fib, CHF, and CAD.   OT comments  Pt making steady progress towards OT goals this session.  Pt received supine in bed ready to mobilize OOB. Pt making good progress with activity tolerance with pt able to stand at sink ~ 10 mins for ADLs and to speak with MD. Pt completing household distance functional mobility with min guard assist and RW. Education and demo provided on all LB AE as pt reports dizziness when reaching to LB ( issued pt handout on where to obtain items if needed). Education also provided on maintaining CHF at home and energy conservation strategies for home. Education also provided on getting pulse ox for home to monitor O2.. Pt very appreciative of all education. Pt likely to DC home tomorrow. Will follow acutely per POC.    Follow Up Recommendations  Supervision - Intermittent    Equipment Recommendations  None recommended by OT    Recommendations for Other Services      Precautions / Restrictions Precautions Precautions: Fall Restrictions Weight Bearing Restrictions: No       Mobility Bed Mobility Overal bed mobility: Modified Independent                  Transfers Overall transfer level: Needs assistance Equipment used: Rolling walker (2 wheeled) Transfers: Sit to/from Stand Sit to Stand: Min guard         General transfer comment: Min guard for safety to rise from EOB    Balance Overall balance assessment: Needs assistance Sitting-balance support: No upper extremity supported;Feet supported Sitting balance-Leahy Scale: Good Sitting balance  - Comments: sitting EOB for LB ADLs with no LOB   Standing balance support: No upper extremity supported;During functional activity Standing balance-Leahy Scale: Fair Standing balance comment: able to stand at sink ~ 10 mins for ADLs and to speak to MD with no LOB                           ADL either performed or assessed with clinical judgement   ADL Overall ADL's : Needs assistance/impaired     Grooming: Oral care;Standing;Supervision/safety;Wash/dry face Grooming Details (indicate cue type and reason): close supervision standing at sink         Upper Body Dressing : Set up;Sitting Upper Body Dressing Details (indicate cue type and reason): to don posterior gown Lower Body Dressing: Supervision/safety;Sitting/lateral leans;Cueing for sequencing Lower Body Dressing Details (indicate cue type and reason): to adjust socks from EOB , pt additionally able to use sock aid to don socks from EOB with with cues for sequencing of task, demo provided on using reacher to don pants as ECS Toilet Transfer: Ambulation;Min Psychiatric nurse Details (indicate cue type and reason): simulated via functional mobility with RW, minguard for steadying assist         Functional mobility during ADLs: Min guard;Rolling walker General ADL Comments: pt completing functional mobility, standing grooming and provided education on ECS, CHF and AE     Vision       Perception     Praxis  Cognition Arousal/Alertness: Awake/alert Behavior During Therapy: WFL for tasks assessed/performed Overall Cognitive Status: Within Functional Limits for tasks assessed                                 General Comments: very pleasant and appreciative of education        Exercises     Shoulder Instructions       General Comments education provided on CHF/ self care, ECS and AE for LB ADLS. Pt on RA and VSS, pt reports his daughter ( who is a Marine scientist) manages his meds     Pertinent Vitals/ Pain       Pain Assessment: No/denies pain  Home Living                                          Prior Functioning/Environment              Frequency  Min 2X/week        Progress Toward Goals  OT Goals(current goals can now be found in the care plan section)  Progress towards OT goals: Progressing toward goals  Acute Rehab OT Goals Patient Stated Goal: to go home OT Goal Formulation: With patient Time For Goal Achievement: 07/24/20 Potential to Achieve Goals: Good  Plan Discharge plan remains appropriate;Frequency remains appropriate    Co-evaluation                 AM-PAC OT "6 Clicks" Daily Activity     Outcome Measure   Help from another person eating meals?: None Help from another person taking care of personal grooming?: None Help from another person toileting, which includes using toliet, bedpan, or urinal?: A Little Help from another person bathing (including washing, rinsing, drying)?: A Little Help from another person to put on and taking off regular upper body clothing?: None Help from another person to put on and taking off regular lower body clothing?: A Little 6 Click Score: 21    End of Session Equipment Utilized During Treatment: Gait belt;Rolling walker;Other (comment) (LB AE)  OT Visit Diagnosis: Unsteadiness on feet (R26.81);Muscle weakness (generalized) (M62.81)   Activity Tolerance Patient tolerated treatment well   Patient Left in chair;with call bell/phone within reach   Nurse Communication Mobility status        Time: [id number] OT Time Calculation (min): 32 min  Charges: OT General Charges $OT Visit: 1 Visit OT Treatments $Self Care/Home Management : 23-37 mins  [name], COTA/L Acute Rehabilitation Services [phone]    Precious Haws 07/11/2020, 3:28 PM

## 2020-07-11 NOTE — Progress Notes (Signed)
PROGRESS NOTE    Charles Mcdonald  [id number] DOB: 06-25-1945 DOA: 07/08/2020 PCP: Joline Salt, PA-C    Brief Narrative:  75yo with hx CAD, CHF who presented initially with concerns of STEMI with acute chest pain and sob. Pt had LOC with VT and findings of gross volume overload, requiring intubation. Pt was admitted to ICU. Cardiology had been following and pt had been diuresed. 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 Coronary anatomy was essentially unchanged on cardiac catheterization and an unsuccessful attempt was made at a PCI of the LAD. On ASA, Lipitor and metoprolol at home. Trops neg. - Cardiology recs to transition to PO lasix today  - Recheck lytes in AM  Atrial fibrilation On eliquis at home.    - Eliquis resumed as of 4/19  AKI on chronic kidney disease - Cr is remaining stable with diuresis -Will repeat bmet in AM  Hypothyroidism - Continue Synthroid as tolerated -Recommend repeat TSH in 4-6 weeks  DM - SSI PRN   DVT prophylaxis: Eliquis Code Status: Full Family Communication: Pt in room, family not at bedside  Status is: Inpatient  Remains inpatient appropriate because:Inpatient level of care appropriate due to  severity of illness   Dispo: The patient is from: Home              Anticipated d/c is to: Home              Patient currently is not medically stable to d/c.   Difficult to place patient No       Consultants:   Cardiology  PCCM  Procedures:     Antimicrobials: Anti-infectives (From admission, onward)   None       Subjective: Reports feeling better. Eager to go home soon  Objective: Vitals:   07/11/20 0359 07/11/20 0804 07/11/20 1116 07/11/20 1640  BP:  92/65 100/67 106/65  Pulse:  78 75 68  Resp:  '18 20 18  '$ Temp:  98.9 F (37.2 C) 99.4 F (37.4 C) 97.6 F (36.4 C)  TempSrc:  Oral Oral Oral  SpO2:  98% 100% 100%  Weight: 83.7 kg     Height:        Intake/Output Summary (Last 24 hours) at 07/11/2020 1716 Last data filed at 07/11/2020 1115 Gross per 24 hour  Intake 100 ml  Output 4150 ml  Net -4050 ml   Filed Weights   07/09/20 0405 07/10/20 0450 07/11/20 0359  Weight: 90.7 kg 85.9 kg 83.7 kg    Examination: General exam: Awake, standing at the sink, in nad Respiratory system: Normal respiratory effort, no wheezing Cardiovascular system: regular rate, s1, s2 Gastrointestinal system: Soft, nondistended, positive BS Central nervous system: CN2-12 grossly intact, strength intact Extremities: Perfused, no clubbing Skin: Normal skin turgor, no notable skin lesions seen Psychiatry: Mood normal //  no visual hallucinations   Data Reviewed: I have personally reviewed following labs and imaging studies  CBC: Recent Labs  Lab 07/08/20 2356 07/09/20 0030 07/09/20 0046 07/09/20 0320 07/09/20 0736  WBC 13.9*  --   --   --  14.6*  HGB 12.3* 10.5* 10.9* 10.9* 11.4*  HCT 40.3 31.0* 32.0* 32.0* 36.7*  MCV 92.4  --   --   --  89.5  PLT 431*  --   --   --  [id number]   Basic Metabolic Panel: Recent Labs  Lab 07/09/20 0437 07/09/20 0736 07/09/20 1436 07/10/20 0212 07/10/20 1526 07/11/20 0204 07/11/20 1401  NA  --    < > 143 142 141 138 141  K  --    <  > 4.2 3.5 4.2 3.7 3.7  CL  --    < > 106 106 104 102 103  CO2  --    < > '25 28 26 28 28  '$ GLUCOSE  --    < > 105* 110* 105* 124* 161*  BUN  --    < > 23 25* 25* 23 22  CREATININE  --    < > 1.98* 2.03* 2.04* 2.02* 2.08*  CALCIUM  --    < > 9.7 9.0 9.1 8.7* 9.3  MG 1.9  --   --  1.7  --  1.8  --   PHOS 4.2  --   --  3.2  --  2.8  --    < > = values in this interval not displayed.   GFR: Estimated Creatinine Clearance: 35.2 mL/min (A) (by C-G formula based on SCr of 2.08 mg/dL (H)). Liver Function Tests: Recent Labs  Lab 07/08/20 2356  AST 21  ALT 16  ALKPHOS 124  BILITOT 0.4  PROT 6.8  ALBUMIN 3.4*   No results for input(s): LIPASE, AMYLASE in the last 168 hours. No results for input(s): AMMONIA in the last 168 hours. Coagulation Profile: Recent Labs  Lab 07/09/20 0437  INR 1.5*   Cardiac Enzymes: No results for input(s): CKTOTAL, CKMB, CKMBINDEX, TROPONINI in the last 168 hours. BNP (last 3 results) No results for input(s): PROBNP in the last 8760 hours. HbA1C: No results for input(s): HGBA1C in the last 72 hours. CBG: Recent Labs  Lab 07/10/20 2049 07/10/20 2347 07/11/20 0355 07/11/20 1111 07/11/20 1637  GLUCAP 117* 112* 104* 113* 122*   Lipid Profile: No results for input(s): CHOL, HDL, LDLCALC, TRIG, CHOLHDL, LDLDIRECT in the last 72 hours. Thyroid Function Tests: Recent Labs    07/09/20 0437  TSH 17.355*  FREET4 0.85   Anemia Panel: No results for input(s): VITAMINB12, FOLATE, FERRITIN, TIBC, IRON, RETICCTPCT in the last 72 hours. Sepsis Labs: Recent Labs  Lab 07/08/20 2356 07/09/20 0437 07/10/20 0212  PROCALCITON  --  0.46 1.14  LATICACIDVEN 4.3* 2.9*  --     Recent Results (from the past 240 hour(s))  Resp Panel by RT-PCR (Flu A&B, Covid) Nasopharyngeal Swab     Status: None   Collection Time: 07/09/20 12:58 AM   Specimen: Nasopharyngeal Swab; Nasopharyngeal(NP) swabs in vial transport medium  Result Value Ref Range Status   SARS  Coronavirus 2 by RT PCR NEGATIVE NEGATIVE Final    Comment: (NOTE) SARS-CoV-2 target nucleic acids are NOT DETECTED.  The SARS-CoV-2 RNA is generally detectable in upper respiratory specimens during the acute phase of infection. The lowest concentration of SARS-CoV-2 viral copies this assay can detect is 138 copies/mL. A negative result does not  preclude SARS-Cov-2 infection and should not be used as the sole basis for treatment or other patient management decisions. A negative result may occur with  improper specimen collection/handling, submission of specimen other than nasopharyngeal swab, presence of viral mutation(s) within the areas targeted by this assay, and inadequate number of viral copies(<138 copies/mL). A negative result must be combined with clinical observations, patient history, and epidemiological information. The expected result is Negative.  Fact Sheet for Patients:  EntrepreneurPulse.com.au  Fact Sheet for Healthcare Providers:  IncredibleEmployment.be  This test is no t yet approved or cleared by the [place] FDA and  has been authorized for detection and/or diagnosis of SARS-CoV-2 by FDA under an Emergency Use Authorization (EUA). This EUA will remain  in effect (meaning this test can be used) for the duration of the COVID-19 declaration under Section 564(b)(1) of the Act, 21 U.S.C.section 360bbb-3(b)(1), unless the authorization is terminated  or revoked sooner.       Influenza A by PCR NEGATIVE NEGATIVE Final   Influenza B by PCR NEGATIVE NEGATIVE Final    Comment: (NOTE) The Xpert Xpress SARS-CoV-2/FLU/RSV plus assay is intended as an aid in the diagnosis of influenza from Nasopharyngeal swab specimens and should not be used as a sole basis for treatment. Nasal washings and aspirates are unacceptable for Xpert Xpress SARS-CoV-2/FLU/RSV testing.  Fact Sheet for  Patients: EntrepreneurPulse.com.au  Fact Sheet for Healthcare Providers: IncredibleEmployment.be  This test is not yet approved or cleared by the [place] FDA and has been authorized for detection and/or diagnosis of SARS-CoV-2 by FDA under an Emergency Use Authorization (EUA). This EUA will remain in effect (meaning this test can be used) for the duration of the COVID-19 declaration under Section 564(b)(1) of the Act, 21 U.S.C. section 360bbb-3(b)(1), unless the authorization is terminated or revoked.  Performed at [facility], [place] [address] [place], [place] [address]   MRSA PCR Screening     Status: None   Collection Time: 07/09/20  3:51 AM   Specimen: Nasopharyngeal  Result Value Ref Range Status   MRSA by PCR NEGATIVE NEGATIVE Final    Comment:        The GeneXpert MRSA Assay (FDA approved for NASAL specimens only), is one component of a comprehensive MRSA colonization surveillance program. It is not intended to diagnose MRSA infection nor to guide or monitor treatment for MRSA infections. Performed at [facility], [place] [address], [place], [place] [address]   Culture, blood (routine x 2)     Status: None (Preliminary result)   Collection Time: 07/09/20  4:02 AM   Specimen: BLOOD RIGHT HAND  Result Value Ref Range Status   Specimen Description BLOOD RIGHT HAND  Final   Special Requests   Final    BOTTLES DRAWN AEROBIC AND ANAEROBIC Blood Culture adequate volume   Culture   Final    NO GROWTH 2 DAYS Performed at [facility], [place] [address] [place], [place] [address]    Report Status PENDING  Incomplete  Culture, blood (routine x 2)     Status: None (Preliminary result)   Collection Time: 07/09/20  4:31 AM   Specimen: BLOOD RIGHT HAND  Result Value Ref Range Status   Specimen Description BLOOD RIGHT HAND  Final   Special Requests   Final    BOTTLES DRAWN AEROBIC AND ANAEROBIC Blood Culture results may  not be optimal due to an inadequate volume of blood received in culture bottles   Culture  Final    NO GROWTH 2 DAYS Performed at [facility], [place] [address] [place], [place] [address]    Report Status PENDING  Incomplete     Radiology Studies: DG Chest Port 1 View  Result Date: 07/10/2020 CLINICAL DATA:  ARDS. EXAM: PORTABLE CHEST 1 VIEW COMPARISON:  07/09/2020. FINDINGS: Interim extubation and removal of NG tube. Cardiac pacer with lead tips over the right atrium right ventricle. Cardiomegaly. Diffuse bilateral pulmonary infiltrates/edema again noted. Slight improvement in aeration of the right lung noted. A small to moderate left pleural effusion cannot be excluded. No pneumothorax. IMPRESSION: 1.  Interim extubation and removal of NG tube. 2.  Cardiac pacer in stable position.  Cardiomegaly again noted. 3. Diffuse bilateral pulmonary infiltrates/edema. Slight improvement in aeration right lung. Small to moderate left pleural effusion cannot be excluded. Electronically Signed   By: Marcello Moores  Register   On: 07/10/2020 06:38    Scheduled Meds: . amiodarone  200 mg Oral Daily  . apixaban  5 mg Oral BID  . aspirin  81 mg Oral Daily  . atorvastatin  40 mg Oral Daily  . Chlorhexidine Gluconate Cloth  6 each Topical Daily  . docusate sodium  100 mg Oral BID  . feeding supplement  237 mL Oral BID BM  . [START ON 07/12/2020] furosemide  40 mg Oral Daily  . insulin aspart  0-20 Units Subcutaneous Q4H  . losartan  12.5 mg Oral Daily  . metoprolol succinate  12.5 mg Oral Daily  . multivitamin with minerals  1 tablet Oral Daily  . pantoprazole (PROTONIX) IV  40 mg Intravenous QHS  . polyethylene glycol  17 g Oral Daily   Continuous Infusions: . sodium chloride 10 mL/hr at 07/10/20 0300     LOS: 2 days   Marylu Lund, MD [facility] Pager On Amion  If 7PM-7AM, please contact night-coverage 07/11/2020, 5:16 PM

## 2020-07-11 NOTE — Progress Notes (Addendum)
Progress Note  Patient Name: CHAYSON GRALL Date of Encounter: 07/11/2020  [facility] Cardiologist: Sanda Klein, MD   Subjective   Feeling well this morning. Hopeful to go home soon.   Inpatient Medications    Scheduled Meds: . amiodarone  200 mg Oral Daily  . apixaban  5 mg Oral BID  . aspirin  81 mg Oral Daily  . atorvastatin  40 mg Oral Daily  . Chlorhexidine Gluconate Cloth  6 each Topical Daily  . docusate sodium  100 mg Oral BID  . furosemide  40 mg Intravenous Q12H  . insulin aspart  0-20 Units Subcutaneous Q4H  . losartan  12.5 mg Oral Daily  . metoprolol succinate  12.5 mg Oral Daily  . pantoprazole (PROTONIX) IV  40 mg Intravenous QHS  . polyethylene glycol  17 g Oral Daily   Continuous Infusions: . sodium chloride 10 mL/hr at 07/10/20 0300   PRN Meds: acetaminophen, docusate, ipratropium-albuterol, ondansetron (ZOFRAN) IV, polyethylene glycol   Vital Signs    Vitals:   07/10/20 2327 07/11/20 0312 07/11/20 0359 07/11/20 0804  BP: 116/69 106/66  92/65  Pulse: 93 80  78  Resp: '20 20  18  '$ Temp: 98.3 F (36.8 C) 98.6 F (37 C)  98.9 F (37.2 C)  TempSrc: Oral Oral  Oral  SpO2: 95% 97%  98%  Weight:   83.7 kg   Height:        Intake/Output Summary (Last 24 hours) at 07/11/2020 0920 Last data filed at 07/11/2020 0351 Gross per 24 hour  Intake 0 ml  Output 3750 ml  Net -3750 ml   Last 3 Weights 07/11/2020 07/10/2020 07/09/2020  Weight (lbs) 184 lb 9.6 oz 189 lb 6 oz 199 lb 15.3 oz  Weight (kg) 83.734 kg 85.9 kg 90.7 kg      Telemetry    NSR - Personally Reviewed  ECG    No new tracing   Physical Exam  Pleasant older male, sitting up in bed GEN: No acute distress.   Neck: No JVD Cardiac: RRR, no murmurs, rubs, or gallops.  Respiratory: Clear to auscultation bilaterally. GI: Soft, nontender, non-distended. Colostomy present MS: No edema; No deformity. Neuro:  Nonfocal  Psych: Normal affect   Labs    High Sensitivity Troponin:    Recent Labs  Lab 06/16/20 1637 06/16/20 1808 07/08/20 2356 07/09/20 0156  TROPONINIHS 1,214* 1,810* 26* 47*      Chemistry Recent Labs  Lab 07/08/20 2356 07/09/20 0030 07/10/20 0212 07/10/20 1526 07/11/20 0204  NA 142   < > 142 141 138  K 4.2   < > 3.5 4.2 3.7  CL 111   < > 106 104 102  CO2 20*   < > '28 26 28  '$ GLUCOSE 220*   < > 110* 105* 124*  BUN 24*   < > 25* 25* 23  CREATININE 2.14*   < > 2.03* 2.04* 2.02*  CALCIUM 9.4   < > 9.0 9.1 8.7*  PROT 6.8  --   --   --   --   ALBUMIN 3.4*  --   --   --   --   AST 21  --   --   --   --   ALT 16  --   --   --   --   ALKPHOS 124  --   --   --   --   BILITOT 0.4  --   --   --   --  GFRNONAA 32*   < > 34* 34* 34*  ANIONGAP 11   < > '8 11 8   '$ < > = values in this interval not displayed.     Hematology Recent Labs  Lab 07/08/20 2356 07/09/20 0030 07/09/20 0046 07/09/20 0320 07/09/20 0736  WBC 13.9*  --   --   --  14.6*  RBC 4.36  --   --   --  4.10*  HGB 12.3*   < > 10.9* 10.9* 11.4*  HCT 40.3   < > 32.0* 32.0* 36.7*  MCV 92.4  --   --   --  89.5  MCH 28.2  --   --   --  27.8  MCHC 30.5  --   --   --  31.1  RDW 17.7*  --   --   --  17.5*  PLT 431*  --   --   --  339   < > = values in this interval not displayed.    BNP Recent Labs  Lab 07/08/20 2356  BNP 2,369.9*     DDimer No results for input(s): DDIMER in the last 168 hours.   Radiology    DG Chest Port 1 View  Result Date: 07/10/2020 CLINICAL DATA:  ARDS. EXAM: PORTABLE CHEST 1 VIEW COMPARISON:  07/09/2020. FINDINGS: Interim extubation and removal of NG tube. Cardiac pacer with lead tips over the right atrium right ventricle. Cardiomegaly. Diffuse bilateral pulmonary infiltrates/edema again noted. Slight improvement in aeration of the right lung noted. A small to moderate left pleural effusion cannot be excluded. No pneumothorax. IMPRESSION: 1.  Interim extubation and removal of NG tube. 2.  Cardiac pacer in stable position.  Cardiomegaly again noted.  3. Diffuse bilateral pulmonary infiltrates/edema. Slight improvement in aeration right lung. Small to moderate left pleural effusion cannot be excluded. Electronically Signed   By: Marcello Moores  Register   On: 07/10/2020 06:38   ECHOCARDIOGRAM LIMITED  Result Date: 07/09/2020    ECHOCARDIOGRAM REPORT   Patient Name:   CORRY PEOPLES Date of Exam: 07/09/2020 Medical Rec #:  [id number]       Height:       72.0 in Accession #:    [id number]      Weight:       200.0 lb Date of Birth:  29-Nov-1945      BSA:          2.130 m Patient Age:    75 years        BP:           104/66 mmHg Patient Gender: M               HR:           82 bpm. Exam Location:  Inpatient Procedure: Limited Echo, Limited Color Doppler, Color Doppler and Intracardiac            Opacification Agent Indications:    CHF-Acute Systolic [id number]  History:        Patient has prior history of Echocardiogram examinations, most                 recent 06/17/2020. Arrythmias:Atrial Fibrillation; Risk                 Factors:Dyslipidemia and Hypertension.  Sonographer:    Mikki Santee RDCS (AE) Referring Phys: [id number] [place]  1. Left ventricular ejection fraction, by estimation, is 20 to 25%. The left ventricle has severely decreased function. The  left ventricle demonstrates regional wall motion abnormalities.         There is akinesis of the entire anteroseptal, anterior LV walls and apex. There is severe hypokinesis of all inferoseptal and inferior LV walls. The inferolateral wall is moderately hypokinetic. The left ventricular internal cavity size was moderately dilated. Left ventricular diastolic parameters are consistent with Grade II diastolic dysfunction (pseudonormalization).  2. Right ventricular systolic function is mildly reduced. The right ventricular size is normal. There is moderately elevated pulmonary artery systolic pressure. The estimated right ventricular systolic pressure is 123456 mmHg.  3. Left atrial size was moderately  dilated.  4. The mitral valve is normal in structure. Trivial mitral valve regurgitation. No evidence of mitral stenosis.  5. The aortic valve is tricuspid. Aortic valve regurgitation is not visualized. No aortic stenosis is present.  6. The inferior vena cava is normal in size with <50% respiratory variability, suggesting right atrial pressure of 8 mmHg. Comparison(s): Compared to prior study on 06/16/20, the PASP is now 86mHg (previously 312mg). FINDINGS  Left Ventricle: Left ventricular ejection fraction, by estimation, is 20 to 25%. The left ventricle has severely decreased function. The left ventricle demonstrates regional wall motion abnormalities. Definity contrast agent was given IV to delineate the left ventricular endocardial borders. There is akinesis of the entire anteroseptal, anterior LV walls and apex. There is severe hypokinesis of all inferoseptal and inferior LV walls. The inferolateral wall is moderately hypokinetic. The left ventricular internal cavity size was moderately dilated. There is no left ventricular hypertrophy. Left ventricular diastolic parameters are consistent with Grade II diastolic dysfunction (pseudonormalization). Right Ventricle: The right ventricular size is normal. No increase in right ventricular wall thickness. Right ventricular systolic function is mildly reduced. There is moderately elevated pulmonary artery systolic pressure. The tricuspid regurgitant velocity is 3.43 m/s, and with an assumed right atrial pressure of 8 mmHg, the estimated right ventricular systolic pressure is 55123456mHg. Left Atrium: Left atrial size was moderately dilated. Right Atrium: Right atrial size was normal in size. Pericardium: There is no evidence of pericardial effusion. Mitral Valve: The mitral valve is normal in structure. Trivial mitral valve regurgitation. No evidence of mitral valve stenosis. Tricuspid Valve: The tricuspid valve is normal in structure. Tricuspid valve regurgitation is  trivial. Aortic Valve: The aortic valve is tricuspid. Aortic valve regurgitation is not visualized. No aortic stenosis is present. Pulmonic Valve: The pulmonic valve was not well visualized. Pulmonic valve regurgitation is not visualized. Aorta: The aortic root and ascending aorta are structurally normal, with no evidence of dilitation. Venous: The inferior vena cava is normal in size with less than 50% respiratory variability, suggesting right atrial pressure of 8 mmHg. IAS/Shunts: No atrial level shunt detected by color flow Doppler. Additional Comments: A device lead is visualized. There is a small pleural effusion in the left lateral region.  LEFT VENTRICLE PLAX 2D LVIDd:         6.20 cm  Diastology LVIDs:         5.00 cm  LV e' medial:    3.73 cm/s LV PW:         0.90 cm  LV E/e' medial:  27.9 LV IVS:        0.60 cm  LV e' lateral:   7.62 cm/s LVOT diam:     2.20 cm  LV E/e' lateral: 13.6 LVOT Area:     3.80 cm  LEFT ATRIUM           Index LA diam:  4.50 cm 2.11 cm/m LA Vol (A4C): 79.1 ml 37.13 ml/m   AORTA Ao Root diam: 3.50 cm MITRAL VALVE                TRICUSPID VALVE MV Area (PHT): 5.02 cm     TR Peak grad:   47.1 mmHg MV Decel Time: 151 msec     TR Vmax:        343.00 cm/s MV E velocity: 104.00 cm/s MV A velocity: 92.40 cm/s   SHUNTS MV E/A ratio:  1.13         Systemic Diam: 2.20 cm Gwyndolyn Kaufman MD Electronically signed by Gwyndolyn Kaufman MD Signature Date/Time: 07/09/2020/12:55:44 PM    Final     Cardiac Studies   Echo 07/09/20:   1. Left ventricular ejection fraction, by estimation, is 20 to 25%. The  left ventricle has severely decreased function. The left ventricle  demonstrates regional wall motion abnormalities.      There is akinesis of the entire anteroseptal, anterior LV walls and  apex. There is severe hypokinesis of all inferoseptal and inferior LV  walls. The inferolateral wall is moderately hypokinetic. The left  ventricular internal cavity size was   moderately dilated. Left ventricular diastolic parameters are consistent  with Grade II diastolic dysfunction (pseudonormalization).  2. Right ventricular systolic function is mildly reduced. The right  ventricular size is normal. There is moderately elevated pulmonary artery  systolic pressure. The estimated right ventricular systolic pressure is  123456 mmHg.  3. Left atrial size was moderately dilated.  4. The mitral valve is normal in structure. Trivial mitral valve  regurgitation. No evidence of mitral stenosis.  5. The aortic valve is tricuspid. Aortic valve regurgitation is not  visualized. No aortic stenosis is present.  6. The inferior vena cava is normal in size with <50% respiratory  variability, suggesting right atrial pressure of 8 mmHg.   Comparison(s): Compared to prior study on 06/16/20, the PASP is now 20mHg  (previously 366mg).   Patient Profile     [age].o. 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 in the setting of severe hypokalemia, persistent atrial fibrillation on Eliquis, left bundle branch block, hypertension, hyperlipidemia, diabetes, CKD stage III andFournier'sgangrenewho was seen for the evaluation of cardiac arrestat the request of Dr. SmTamala Julian Assessment & Plan    1. Acute hypoxic respiratory failure: suspect 2/2 acute on chronic CHF exacerbation. Required intubation on admission, but now extubated on [place]. BNP 2369, CXR with edema. In talking with patient, plan from recent office note was to start lasix but not appear that he ever did. Reports compliance with low Na+ diet. -- now weaned to RA with stable O2 sats  2. Acute on Chronic ICM: has EF of 20-25% with severe CAD that is not amenable to PCI/CABG from cath in 2017.  -- was hypotensive on admission, therefore would add back GDMT as tolerated (Toprol, losartan PTA) -- continue  with IV lasix, net - 7.9L. weight is down 16 lbs.  -- significant improvement in edema, will plan to transition to oral lasix, '40mg'$  daily -- blood pressures are soft but tolerating low dose losartan and Toprol. [facility] BP can tolerate.  3. Known extensive hx of CAD: no chest pain per his report PTA. Has known extensive CAD from last reported cath in 2017 with occluded  LAD, Lcx and RCA not amenable to PCI or CABG. hsTn with low flat trend. EKG with known LBBB -- at this time do not anticipate further ischemic work up, favor treatment for CHF -- attempting to get cath films from [place] for further review, but do not anticipate this will change therapy  -- on asa, statin  4. PAF: on eliquis PTA, held on admission -- now transitioned back to Eliquis  5. Hx of VT s/p ICD: prior admission with VT storm in the setting of severe hypokalemia -- no VT seen on device interrogation on admission -- continue PO amiodarone '200mg'$  daily  6. Hypothyroidism:TSH 17, normal T4 -- on synthroid   7. CKD stage III: baseline Cr around 1.8. 2.14>>2.10>>2.03>>2.02 -- monitor with diuresis   For questions or updates, please contact [place] Please consult [email or web address] for contact info under        Signed, Reino Bellis, NP  07/11/2020, 9:20 AM

## 2020-07-11 NOTE — Consult Note (Signed)
[place] Nurse ostomy consult note Stoma type/location: LLQ colostomy.  Wife cares for his ostomy at home. Pouch from home is still intact and is pointing sideways.  I informed him that I would order supplies and we could perform a pouch change.  He will notify his wife so she can be here. She has some vision problems, he reports, so he is unsure when she is coming.   Stomal assessment/size: 1 1/2" pink and moist  Budded Peristomal assessment: pouch intact  Will change when supplies arrive Treatment options for stomal/peristomal skin: barrier ring and 1piece pouch Output soft brown stool in pouch Ostomy pouching: 1pc.pouch with barrier ring Education provided: See above.  Enrolled patient in [place] program: No  Does not wear [place] pouches Will follow.  Domenic Moras MSN, RN, FNP-BC CWON Wound, Ostomy, Continence Nurse Pager [phone]

## 2020-07-11 NOTE — Plan of Care (Signed)
  Problem: Health Behavior/Discharge Planning: Goal: Ability to manage health-related needs will improve Outcome: Progressing   Problem: Activity: Goal: Risk for activity intolerance will decrease Outcome: Progressing   

## 2020-07-11 NOTE — Progress Notes (Signed)
Interventional cardiology review:  Cardiac cath films reviewed from 07/03/15 at [facility].  This demonstrated a normal left main.  The LAD was flush occluded at the ostium. There is evidence of a stent in the proximal LAD. It is calcified. There are left to left collaterals (late) to the mid and distal LAD The LCx gives rise to 2 OM branches then continues in the AV groove. The first OM is a large bifurcating vessel with 40% disease prior to the bifurcation. The second OM is occluded and fills late by left to left collaterals. The continuation AV groove branch is small in caliber and has 90% stenosis. The RCA is subtotally occluded in the mid vessel following an RV marginal branch. The distal vessel fills by right to right collaterals. The RV marginal vessel is severely diseased. The RCA is calcified. It appears that some attempt was made to open the LAD but unable to engage the proximal cap with a wire.  In review of his films there were no good PCI targets. The RCA could be approached for CTO PCI but only if patient has significant viability. Unclear if he ever had viability study and would be a poor candidate for MRI with ICD in place. Coronary occlusions date back to 2004 per records.   All this favors continued medical therapy. If he were to have significant angina it would be reasonable to do cardiac cath to make sure he doesn't have progressive disease in the proximal LCx/OM1.  Vern Guerette Martinique MD, [facility]

## 2020-07-11 NOTE — Progress Notes (Signed)
Nutrition Follow Up  DOCUMENTATION CODES:   Not applicable  INTERVENTION:    Ensure Enlive po BID, each supplement provides 350 kcal and 20 grams of protein  MVI daily   NUTRITION DIAGNOSIS:   Inadequate oral intake related to inability to eat as evidenced by NPO status.  Diet advanced   GOAL:   Patient will meet greater than or equal to 90% of their needs   Progressing   MONITOR:   Vent status,Labs,Weight trends,I & O's  REASON FOR ASSESSMENT:   Ventilator    ASSESSMENT:   75 year old male with medical history of CAD, CHF, stage 3 CKD, HLD, HTN, DM, and gout. He presented to the ED due to possible STEMI. In the ED his wife reported that patient had sudden onset of chest pain and several weeks of progressive shortness of breath. He was intubated in the ED.   4/18- extubated   Transferred to progressive unit yesterday. Patient not in room at time of RD follow up. Per RN, patient does not use PEG at home and is able to maintain his weight with PO intake alone. Last two meal completions charted as 75% and 100%. RD to provide supplementation to maximize kcal and protein.   Admission weight: 91.2 kg  Current weight: 83.7 kg   UOP: 4525 ml x 24 hrs   Medications: colace, 40 mg lasix daily, SS novolog, miralax Labs: CBG 80-117  Diet Order:   Diet Order            Diet heart healthy/carb modified Room service appropriate? Yes; Fluid consistency: Thin; Fluid restriction: 1500 mL Fluid  Diet effective now                 EDUCATION NEEDS:   Not appropriate for education at this time  Skin:  Skin Assessment: Reviewed RN Assessment  Last BM:  4/18- via colostomy   Height:   Ht Readings from Last 1 Encounters:  07/08/20 6' (1.829 m)    Weight:   Wt Readings from Last 1 Encounters:  07/11/20 83.7 kg    Estimated Nutritional Needs:   Kcal:  2400-2600 kcal   Protein:  120-145 grams   Fluid:  >/= 2 L/day  Mariana Single RD, LDN Clinical  Nutrition Pager listed in [place]

## 2020-07-12 ENCOUNTER — Encounter (HOSPITAL_COMMUNITY): Payer: Self-pay | Admitting: Pulmonary Disease

## 2020-07-12 ENCOUNTER — Other Ambulatory Visit (HOSPITAL_COMMUNITY): Payer: Self-pay

## 2020-07-12 DIAGNOSIS — I5043 Acute on chronic combined systolic (congestive) and diastolic (congestive) heart failure: Secondary | ICD-10-CM

## 2020-07-12 LAB — COMPREHENSIVE METABOLIC PANEL
ALT: 15 U/L (ref 0–44)
AST: 18 U/L (ref 15–41)
Albumin: 2.6 g/dL — ABNORMAL LOW (ref 3.5–5.0)
Alkaline Phosphatase: 75 U/L (ref 38–126)
Anion gap: 8 (ref 5–15)
BUN: 23 mg/dL (ref 8–23)
CO2: 30 mmol/L (ref 22–32)
Calcium: 9.2 mg/dL (ref 8.9–10.3)
Chloride: 103 mmol/L (ref 98–111)
Creatinine, Ser: 2.03 mg/dL — ABNORMAL HIGH (ref 0.61–1.24)
GFR, Estimated: 34 mL/min — ABNORMAL LOW (ref >60)
Glucose, Bld: 107 mg/dL — ABNORMAL HIGH (ref 70–99)
Potassium: 4 mmol/L (ref 3.5–5.1)
Sodium: 141 mmol/L (ref 135–145)
Total Bilirubin: 0.7 mg/dL (ref 0.3–1.2)
Total Protein: 5.3 g/dL — ABNORMAL LOW (ref 6.5–8.1)

## 2020-07-12 LAB — GLUCOSE, CAPILLARY
Glucose-Capillary: 133 mg/dL — ABNORMAL HIGH (ref 70–99)
Glucose-Capillary: 112 mg/dL — ABNORMAL HIGH (ref 70–99)

## 2020-07-12 LAB — PHOSPHORUS: Phosphorus: 3.3 mg/dL (ref 2.5–4.6)

## 2020-07-12 LAB — MAGNESIUM: Magnesium: 1.9 mg/dL (ref 1.7–2.4)

## 2020-07-12 MED ORDER — AMIODARONE HCL 200 MG PO TABS
200.0000 mg | ORAL_TABLET | Freq: Every day | ORAL | 0 refills | Status: DC
  Filled 2020-07-12: qty 30, 30d supply, fill #0

## 2020-07-12 MED ORDER — LOSARTAN POTASSIUM 25 MG PO TABS
12.5000 mg | ORAL_TABLET | Freq: Every day | ORAL | 0 refills | Status: DC
  Filled 2020-07-12: qty 15, 30d supply, fill #0

## 2020-07-12 MED ORDER — FUROSEMIDE 40 MG PO TABS
40.0000 mg | ORAL_TABLET | Freq: Every day | ORAL | 0 refills | Status: DC
  Filled 2020-07-12: qty 30, 30d supply, fill #0

## 2020-07-12 MED ORDER — METOPROLOL SUCCINATE ER 25 MG PO TB24
12.5000 mg | ORAL_TABLET | Freq: Every day | ORAL | 0 refills | Status: DC
  Filled 2020-07-12: qty 15, 30d supply, fill #0

## 2020-07-12 NOTE — Discharge Summary (Signed)
Physician Discharge Summary  Charles Mcdonald [id number] DOB: 09-07-1945 DOA: 07/08/2020  PCP: Joline Salt, PA-C  Admit date: 07/08/2020 Discharge date: 07/12/2020  Admitted From: Home Disposition:  Home  Recommendations for Outpatient Follow-up:  1. Follow up with PCP in 1-2 weeks 2. Follow up with Cardiology as scheduled  Discharge Condition:Improved CODE STATUS:Full Diet recommendation: Diabetic, heart healthy   Brief/Interim Summary: 75yo with hx CAD, CHF who presented initially with concerns of STEMI with acute chest pain and sob. Pt had LOC with VT and findings of gross volume overload, requiring intubation. Pt was admitted to ICU. Cardiology had been following and pt had been diuresed. 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 Coronary anatomy was essentially unchanged on cardiac catheterization and an unsuccessful attempt was made at a PCI of the LAD.OnASA,Lipitor and metoprolol at home. Trops neg. - Cardiology recs to transition to PO lasix on d/c  Atrial fibrilation On eliquis at home. - Eliquis resumed as of 4/19  AKI on chronic kidney disease - Cr is remaining stable with diuresis  Hypothyroidism - Continue Synthroid as  tolerated -Recommend repeat TSH in 4-6 weeks  DM - SSI PRN  Discharge Instructions   Allergies as of 07/12/2020   No Known Allergies     Medication List    TAKE these medications   allopurinol 100 MG tablet Commonly known as: ZYLOPRIM Take 100 mg by mouth daily.   amiodarone 200 MG tablet Commonly known as: PACERONE Take 1 tablet (200 mg total) by mouth daily. Start taking on: July 13, 2020 What changed: how much to take   apixaban 5 MG Tabs tablet Commonly known as: ELIQUIS Take 5 mg by mouth every 12 (twelve) hours.   ascorbic acid 500 MG tablet Commonly known as: VITAMIN C Take 500 mg by mouth daily.   aspirin EC 81 MG tablet Take 81 mg by mouth as directed. Swallow whole.   atorvastatin 40 MG tablet Commonly known as: LIPITOR Take 40 mg by mouth at bedtime.   colchicine 0.6 MG tablet Take 0.6 mg by mouth See admin instructions. Take 0.6 mg by mouth as directed for gout flares   ergocalciferol 1.25 MG (50000 UT) capsule Commonly known as: VITAMIN D2 Take 50,000 Units by mouth once a week.   furosemide 40 MG tablet Commonly known as: LASIX Take 1 tablet (40 mg total) by mouth daily. Start taking on: July 13, 2020   glucose blood test strip Accu-Chek Aviva Plus test strips   insulin lispro 100 UNIT/ML KwikPen Commonly known as: HUMALOG Inject 5 Units into the skin 2 (two) times daily before a meal.   Lantus SoloStar 100 UNIT/ML Solostar Pen Generic drug: insulin glargine Inject 5-10 Units into the skin See admin instructions. Inject 10 units into the skin before breakfast and 5 units at bedtime   losartan 25 MG tablet Commonly  known as: COZAAR Take 0.5 tablets (12.5 mg total) by mouth daily. Start taking on: July 13, 2020 What changed: how much to take   metoprolol succinate 25 MG 24 hr tablet Commonly known as: TOPROL-XL Take 0.5 tablets (12.5 mg total) by mouth daily. Start taking on: July 13, 2020 What changed:   how much to  take  when to take this   nitroGLYCERIN 0.4 MG SL tablet Commonly known as: NITROSTAT Place 1 tablet (0.4 mg total) under the tongue every 5 (five) minutes x 3 doses as needed for chest pain.   pantoprazole 40 MG tablet Commonly known as: PROTONIX Take 40 mg by mouth at bedtime.   potassium chloride SA 20 MEQ tablet Commonly known as: KLOR-CON Take 1 tablet (20 mEq total) by mouth daily.       Follow-up Information    [facility]. Go to.   Specialty: Cardiology Why: Monday 4/25 @ 2pm. [facility]. FREE valet parking at [employer], off [address] all your medications and pill box with you to your 1+ hour appt.  You will see a doctor, pharmacist, Education officer, museum, and nurse. Contact information: [address] [id number] [place] [phone]       Follow up wtih your PCP Follow up.   Why: 1-2 weeks             No Known Allergies  Consultations:  Cardiology  Procedures/Studies: DG Chest Port 1 View  Result Date: 07/10/2020 CLINICAL DATA:  ARDS. EXAM: PORTABLE CHEST 1 VIEW COMPARISON:  07/09/2020. FINDINGS: Interim extubation and removal of NG tube. Cardiac pacer with lead tips over the right atrium right ventricle. Cardiomegaly. Diffuse bilateral pulmonary infiltrates/edema again noted. Slight improvement in aeration of the right lung noted. A small to moderate left pleural effusion cannot be excluded. No pneumothorax. IMPRESSION: 1.  Interim extubation and removal of NG tube. 2.  Cardiac pacer in stable position.  Cardiomegaly again noted. 3. Diffuse bilateral pulmonary infiltrates/edema. Slight improvement in aeration right lung. Small to moderate left pleural effusion cannot be excluded. Electronically Signed   By: Marcello Moores  Register   On: 07/10/2020 06:38   DG Chest Port 1 View  Result Date: 07/09/2020 CLINICAL DATA:  Shortness of breath,  check endotracheal tube placement EXAM: PORTABLE CHEST 1 VIEW COMPARISON:  06/16/2020 FINDINGS: Endotracheal tube and gastric catheter are noted in satisfactory position. Stable defibrillator is seen. Patchy airspace opacities are noted bilaterally right slightly greater than left. These changes are new from the prior exam and may simply represent pulmonary edema although multifocal pneumonia deserves consideration as well. No bony abnormality is seen. IMPRESSION: Tubes and lines as described above. Patchy airspace opacity bilaterally as described. Edema versus multifocal infiltrate. Electronically Signed   By: Inez Catalina M.D.   On: 07/09/2020 00:17   DG Chest Port 1 View  Result Date: 06/16/2020 CLINICAL DATA:  Tachycardia.  ICD discharging. EXAM: PORTABLE CHEST 1 VIEW COMPARISON:  04/17/20. FINDINGS: Left chest wall ICD is noted with leads in the right atrial appendage and right ventricle. Stable mild cardiac enlargement. Interstitial and airspace opacities within the left midlung and left base is identified. No signs of pleural effusion. IMPRESSION: 1. Asymmetric interstitial and airspace opacities in the left midlung and left base concerning for pneumonia versus asymmetric edema 2. Mild cardiac enlargement. Electronically Signed   By: Kerby Moors M.D.   On: 06/16/2020 13:35  ECHOCARDIOGRAM COMPLETE  Result Date: 06/17/2020    ECHOCARDIOGRAM REPORT   Patient Name:   ARPAN ARNWINE Date of Exam: 06/16/2020 Medical Rec #:  [id number]       Height:       72.0 in Accession #:    [id number]      Weight:       185.0 lb Date of Birth:  [birth year]      BSA:          2.061 m Patient Age:    29 years        BP:           105/81 mmHg Patient Gender: M               HR:           85 bpm. Exam Location:  Inpatient Procedure: 2D Echo Indications:    acute systolic chf  History:        Patient has prior history of Echocardiogram examinations, most                 recent 04/12/2020. Defibrillator, chronic kidney  disease;                 Arrythmias:Atrial Fibrillation and V-Tach.  Sonographer:    Johny Chess Referring Phys: [place]  1. Left ventricular ejection fraction, by estimation, is 20 to 25%. The left ventricle has severely decreased function. The left ventricle demonstrates regional wall motion abnormalities (see scoring diagram/findings for description). The left ventricular internal cavity size was mildly dilated. Left ventricular diastolic parameters are consistent with Grade II diastolic dysfunction (pseudonormalization). Elevated left atrial pressure. There is scarring and akinesis of the left ventricular, entire anteroseptal wall, anterior wall and apical segment. There is moderate hypokinesis of the left ventricular, entire inferoseptal wall, inferior wall and inferolateral wall.  2. Right ventricular systolic function is mildly reduced. The right ventricular size is normal. There is mildly elevated pulmonary artery systolic pressure.  3. Left atrial size was severely dilated.  4. The mitral valve is normal in structure. Mild mitral valve regurgitation.  5. The aortic valve is tricuspid. Aortic valve regurgitation is not visualized. Mild aortic valve sclerosis is present, with no evidence of aortic valve stenosis.  6. The inferior vena cava is dilated in size with <50% respiratory variability, suggesting right atrial pressure of 15 mmHg. Comparison(s): No significant change from prior study. Prior images reviewed side by side. FINDINGS  Left Ventricle: Left ventricular ejection fraction, by estimation, is 20 to 25%. The left ventricle has severely decreased function. The left ventricle demonstrates regional wall motion abnormalities. Scarring and akinesis of the left ventricular, entire anteroseptal wall, anterior wall and apical segment. Moderate hypokinesis of the left ventricular, entire inferoseptal wall, inferior wall and inferolateral wall. The left ventricular internal  cavity size was mildly dilated. There is no left ventricular hypertrophy. Left ventricular diastolic parameters are consistent with Grade II diastolic dysfunction (pseudonormalization). Elevated left atrial pressure.  LV Wall Scoring: The mid and distal anterior wall, entire anterior septum, and entire apex are akinetic. The inferior wall, posterior wall, mid inferoseptal segment, and basal inferoseptal segment are hypokinetic. The antero-lateral wall and basal anterior segment are normal. Right Ventricle: The right ventricular size is normal. No increase in right ventricular wall thickness. Right ventricular systolic function is mildly reduced. There is mildly elevated pulmonary artery systolic pressure. The tricuspid regurgitant velocity  is 2.38 m/s, and with an assumed right atrial pressure  of 15 mmHg, the estimated right ventricular systolic pressure is 0000000 mmHg. Left Atrium: Left atrial size was severely dilated. Right Atrium: Right atrial size was normal in size. Pericardium: There is no evidence of pericardial effusion. Mitral Valve: The mitral valve is normal in structure. Mild mitral valve regurgitation, with centrally-directed jet. Tricuspid Valve: The tricuspid valve is normal in structure. Tricuspid valve regurgitation is trivial. Aortic Valve: The aortic valve is tricuspid. Aortic valve regurgitation is not visualized. Mild aortic valve sclerosis is present, with no evidence of aortic valve stenosis. Pulmonic Valve: The pulmonic valve was normal in structure. Pulmonic valve regurgitation is not visualized. Aorta: The aortic root and ascending aorta are structurally normal, with no evidence of dilitation. Venous: The inferior vena cava is dilated in size with less than 50% respiratory variability, suggesting right atrial pressure of 15 mmHg. IAS/Shunts: No atrial level shunt detected by color flow Doppler. Additional Comments: A device lead is visualized. There is a small pleural effusion in the right  lateral region.  LEFT VENTRICLE PLAX 2D LVIDd:         5.90 cm  Diastology LVIDs:         5.30 cm  LV e' medial:    3.08 cm/s LV PW:         0.90 cm  LV E/e' medial:  25.5 LV IVS:        0.80 cm  LV e' lateral:   5.63 cm/s LVOT diam:     2.00 cm  LV E/e' lateral: 13.9 LV SV:         35 LV SV Index:   17 LVOT Area:     3.14 cm  RIGHT VENTRICLE            IVC RV S prime:     7.80 cm/s  IVC diam: 2.10 cm TAPSE (M-mode): 1.3 cm LEFT ATRIUM              Index       RIGHT ATRIUM           Index LA diam:        3.90 cm  1.89 cm/m  RA Area:     18.10 cm LA Vol (A2C):   105.0 ml 50.94 ml/m RA Volume:   51.70 ml  25.08 ml/m LA Vol (A4C):   90.3 ml  43.81 ml/m LA Biplane Vol: 100.0 ml 48.52 ml/m  AORTIC VALVE LVOT Vmax:   76.40 cm/s LVOT Vmean:  44.500 cm/s LVOT VTI:    0.112 m  AORTA Ao Root diam: 3.00 cm Ao Asc diam:  3.10 cm MITRAL VALVE               TRICUSPID VALVE MV Area (PHT): 4.89 cm    TR Peak grad:   22.7 mmHg MV Decel Time: 155 msec    TR Vmax:        238.00 cm/s MR Peak grad: 53.3 mmHg MR Mean grad: 34.0 mmHg    SHUNTS MR Vmax:      365.00 cm/s  Systemic VTI:  0.11 m MR Vmean:     272.0 cm/s   Systemic Diam: 2.00 cm MV E velocity: 78.40 cm/s MV A velocity: 52.70 cm/s MV E/A ratio:  1.49 Mihai Croitoru MD Electronically signed by Sanda Klein MD Signature Date/Time: 06/17/2020/12:22:40 PM    Final    ECHOCARDIOGRAM LIMITED  Result Date: 07/09/2020    ECHOCARDIOGRAM REPORT   Patient Name:   [name] Date of Exam:  07/09/2020 Medical Rec #:  [id number]       Height:       72.0 in Accession #:    [id number]      Weight:       200.0 lb Date of Birth:  1945-04-07      BSA:          2.130 m Patient Age:    57 years        BP:           104/66 mmHg Patient Gender: M               HR:           82 bpm. Exam Location:  Inpatient Procedure: Limited Echo, Limited Color Doppler, Color Doppler and Intracardiac            Opacification Agent Indications:    CHF-Acute Systolic [id number]  History:        Patient has  prior history of Echocardiogram examinations, most                 recent 06/17/2020. Arrythmias:Atrial Fibrillation; Risk                 Factors:Dyslipidemia and Hypertension.  Sonographer:    Mikki Santee RDCS (AE) Referring Phys: [id number] [place]  1. Left ventricular ejection fraction, by estimation, is 20 to 25%. The left ventricle has severely decreased function. The left ventricle demonstrates regional wall motion abnormalities.         There is akinesis of the entire anteroseptal, anterior LV walls and apex. There is severe hypokinesis of all inferoseptal and inferior LV walls. The inferolateral wall is moderately hypokinetic. The left ventricular internal cavity size was moderately dilated. Left ventricular diastolic parameters are consistent with Grade II diastolic dysfunction (pseudonormalization).  2. Right ventricular systolic function is mildly reduced. The right ventricular size is normal. There is moderately elevated pulmonary artery systolic pressure. The estimated right ventricular systolic pressure is 123456 mmHg.  3. Left atrial size was moderately dilated.  4. The mitral valve is normal in structure. Trivial mitral valve regurgitation. No evidence of mitral stenosis.  5. The aortic valve is tricuspid. Aortic valve regurgitation is not visualized. No aortic stenosis is present.  6. The inferior vena cava is normal in size with <50% respiratory variability, suggesting right atrial pressure of 8 mmHg. Comparison(s): Compared to prior study on 06/16/20, the PASP is now 63mHg (previously 341mg). FINDINGS  Left Ventricle: Left ventricular ejection fraction, by estimation, is 20 to 25%. The left ventricle has severely decreased function. The left ventricle demonstrates regional wall motion abnormalities. Definity contrast agent was given IV to delineate the left ventricular endocardial borders. There is akinesis of the entire anteroseptal, anterior LV walls and apex. There is  severe hypokinesis of all inferoseptal and inferior LV walls. The inferolateral wall is moderately hypokinetic. The left ventricular internal cavity size was moderately dilated. There is no left ventricular hypertrophy. Left ventricular diastolic parameters are consistent with Grade II diastolic dysfunction (pseudonormalization). Right Ventricle: The right ventricular size is normal. No increase in right ventricular wall thickness. Right ventricular systolic function is mildly reduced. There is moderately elevated pulmonary artery systolic pressure. The tricuspid regurgitant velocity is 3.43 m/s, and with an assumed right atrial pressure of 8 mmHg, the estimated right ventricular systolic pressure is 55123456mHg. Left Atrium: Left atrial size was moderately dilated. Right Atrium: Right atrial size was normal in size. Pericardium: There is no  evidence of pericardial effusion. Mitral Valve: The mitral valve is normal in structure. Trivial mitral valve regurgitation. No evidence of mitral valve stenosis. Tricuspid Valve: The tricuspid valve is normal in structure. Tricuspid valve regurgitation is trivial. Aortic Valve: The aortic valve is tricuspid. Aortic valve regurgitation is not visualized. No aortic stenosis is present. Pulmonic Valve: The pulmonic valve was not well visualized. Pulmonic valve regurgitation is not visualized. Aorta: The aortic root and ascending aorta are structurally normal, with no evidence of dilitation. Venous: The inferior vena cava is normal in size with less than 50% respiratory variability, suggesting right atrial pressure of 8 mmHg. IAS/Shunts: No atrial level shunt detected by color flow Doppler. Additional Comments: A device lead is visualized. There is a small pleural effusion in the left lateral region.  LEFT VENTRICLE PLAX 2D LVIDd:         6.20 cm  Diastology LVIDs:         5.00 cm  LV e' medial:    3.73 cm/s LV PW:         0.90 cm  LV E/e' medial:  27.9 LV IVS:        0.60 cm  LV e'  lateral:   7.62 cm/s LVOT diam:     2.20 cm  LV E/e' lateral: 13.6 LVOT Area:     3.80 cm  LEFT ATRIUM           Index LA diam:      4.50 cm 2.11 cm/m LA Vol (A4C): 79.1 ml 37.13 ml/m   AORTA Ao Root diam: 3.50 cm MITRAL VALVE                TRICUSPID VALVE MV Area (PHT): 5.02 cm     TR Peak grad:   47.1 mmHg MV Decel Time: 151 msec     TR Vmax:        343.00 cm/s MV E velocity: 104.00 cm/s MV A velocity: 92.40 cm/s   SHUNTS MV E/A ratio:  1.13         Systemic Diam: 2.20 cm Gwyndolyn Kaufman MD Electronically signed by Gwyndolyn Kaufman MD Signature Date/Time: 07/09/2020/12:55:44 PM    Final      Subjective: Eager to go home  Discharge Exam: Vitals:   07/12/20 1005 07/12/20 1117  BP: 111/70 125/81  Pulse: 76 76  Resp:  20  Temp:  (!) 97.3 F (36.3 C)  SpO2:  100%   Vitals:   07/12/20 0821 07/12/20 1004 07/12/20 1005 07/12/20 1117  BP: 114/66  111/70 125/81  Pulse: 77 77 76 76  Resp: 20 (!) 22  20  Temp: 98.2 F (36.8 C)   (!) 97.3 F (36.3 C)  TempSrc: Oral   Oral  SpO2: 100%   100%  Weight:      Height:        General: Pt is alert, awake, not in acute distress Cardiovascular: RRR, S1/S2 +, no rubs, no gallops Respiratory: CTA bilaterally, no wheezing, no rhonchi Abdominal: Soft, NT, ND, bowel sounds + Extremities: no edema, no cyanosis   The results of significant diagnostics from this hospitalization (including imaging, microbiology, ancillary and laboratory) are listed below for reference.     Microbiology: Recent Results (from the past 240 hour(s))  Resp Panel by RT-PCR (Flu A&B, Covid) Nasopharyngeal Swab     Status: None   Collection Time: 07/09/20 12:58 AM   Specimen: Nasopharyngeal Swab; Nasopharyngeal(NP) swabs in vial transport medium  Result Value Ref Range Status  SARS Coronavirus 2 by RT PCR NEGATIVE NEGATIVE Final    Comment: (NOTE) SARS-CoV-2 target nucleic acids are NOT DETECTED.  The SARS-CoV-2 RNA is generally detectable in upper  respiratory specimens during the acute phase of infection. The lowest concentration of SARS-CoV-2 viral copies this assay can detect is 138 copies/mL. A negative result does not preclude SARS-Cov-2 infection and should not be used as the sole basis for treatment or other patient management decisions. A negative result may occur with  improper specimen collection/handling, submission of specimen other than nasopharyngeal swab, presence of viral mutation(s) within the areas targeted by this assay, and inadequate number of viral copies(<138 copies/mL). A negative result must be combined with clinical observations, patient history, and epidemiological information. The expected result is Negative.  Fact Sheet for Patients:  EntrepreneurPulse.com.au  Fact Sheet for Healthcare Providers:  IncredibleEmployment.be  This test is no t yet approved or cleared by the [place] FDA and  has been authorized for detection and/or diagnosis of SARS-CoV-2 by FDA under an Emergency Use Authorization (EUA). This EUA will remain  in effect (meaning this test can be used) for the duration of the COVID-19 declaration under Section 564(b)(1) of the Act, 21 U.S.C.section 360bbb-3(b)(1), unless the authorization is terminated  or revoked sooner.       Influenza A by PCR NEGATIVE NEGATIVE Final   Influenza B by PCR NEGATIVE NEGATIVE Final    Comment: (NOTE) The Xpert Xpress SARS-CoV-2/FLU/RSV plus assay is intended as an aid in the diagnosis of influenza from Nasopharyngeal swab specimens and should not be used as a sole basis for treatment. Nasal washings and aspirates are unacceptable for Xpert Xpress SARS-CoV-2/FLU/RSV testing.  Fact Sheet for Patients: EntrepreneurPulse.com.au  Fact Sheet for Healthcare Providers: IncredibleEmployment.be  This test is not yet approved or cleared by the [place] FDA and has been  authorized for detection and/or diagnosis of SARS-CoV-2 by FDA under an Emergency Use Authorization (EUA). This EUA will remain in effect (meaning this test can be used) for the duration of the COVID-19 declaration under Section 564(b)(1) of the Act, 21 U.S.C. section 360bbb-3(b)(1), unless the authorization is terminated or revoked.  Performed at [facility], [place] [address], [place], [place] [address]   MRSA PCR Screening     Status: None   Collection Time: 07/09/20  3:51 AM   Specimen: Nasopharyngeal  Result Value Ref Range Status   MRSA by PCR NEGATIVE NEGATIVE Final    Comment:        The GeneXpert MRSA Assay (FDA approved for NASAL specimens only), is one component of a comprehensive MRSA colonization surveillance program. It is not intended to diagnose MRSA infection nor to guide or monitor treatment for MRSA infections. Performed at [facility], [place] [address], [place], [place] [address]   Culture, blood (routine x 2)     Status: None (Preliminary result)   Collection Time: 07/09/20  4:02 AM   Specimen: BLOOD RIGHT HAND  Result Value Ref Range Status   Specimen Description BLOOD RIGHT HAND  Final   Special Requests   Final    BOTTLES DRAWN AEROBIC AND ANAEROBIC Blood Culture adequate volume   Culture   Final    NO GROWTH 3 DAYS Performed at [facility], [place] [address] [place], [place] [address]    Report Status PENDING  Incomplete  Culture, blood (routine x 2)     Status: None (Preliminary result)   Collection Time: 07/09/20  4:31 AM  Specimen: BLOOD RIGHT HAND  Result Value Ref Range Status   Specimen Description BLOOD RIGHT HAND  Final   Special Requests   Final    BOTTLES DRAWN AEROBIC AND ANAEROBIC Blood Culture results may not be optimal due to an inadequate volume of blood received in culture bottles   Culture   Final    NO GROWTH 3 DAYS Performed at [facility], [place] [address] [place], [place] [address]    Report Status  PENDING  Incomplete     Labs: BNP (last 3 results) Recent Labs    04/11/20 1903 07/02/20 1105 07/08/20 2356  BNP 3,412.9* 1,560.5* [id number]*   Basic Metabolic Panel: Recent Labs  Lab 07/09/20 0437 07/09/20 0736 07/10/20 0212 07/10/20 1526 07/11/20 0204 07/11/20 1401 07/12/20 0224  NA  --    < > 142 141 138 141 141  K  --    < > 3.5 4.2 3.7 3.7 4.0  CL  --    < > 106 104 102 103 103  CO2  --    < > '28 26 28 28 30  '$ GLUCOSE  --    < > 110* 105* 124* 161* 107*  BUN  --    < > 25* 25* '23 22 23  '$ CREATININE  --    < > 2.03* 2.04* 2.02* 2.08* 2.03*  CALCIUM  --    < > 9.0 9.1 8.7* 9.3 9.2  MG 1.9  --  1.7  --  1.8  --  1.9  PHOS 4.2  --  3.2  --  2.8  --  3.3   < > = values in this interval not displayed.   Liver Function Tests: Recent Labs  Lab 07/08/20 2356 07/12/20 0224  AST 21 18  ALT 16 15  ALKPHOS 124 75  BILITOT 0.4 0.7  PROT 6.8 5.3*  ALBUMIN 3.4* 2.6*   No results for input(s): LIPASE, AMYLASE in the last 168 hours. No results for input(s): AMMONIA in the last 168 hours. CBC: Recent Labs  Lab 07/08/20 2356 07/09/20 0030 07/09/20 0046 07/09/20 0320 07/09/20 0736  WBC 13.9*  --   --   --  14.6*  HGB 12.3* 10.5* 10.9* 10.9* 11.4*  HCT 40.3 31.0* 32.0* 32.0* 36.7*  MCV 92.4  --   --   --  89.5  PLT 431*  --   --   --  339   Cardiac Enzymes: No results for input(s): CKTOTAL, CKMB, CKMBINDEX, TROPONINI in the last 168 hours. BNP: Invalid input(s): POCBNP CBG: Recent Labs  Lab 07/11/20 1111 07/11/20 1637 07/11/20 2041 07/12/20 0602 07/12/20 1134  GLUCAP 113* 122* 113* 133* 112*   D-Dimer No results for input(s): DDIMER in the last 72 hours. Hgb A1c No results for input(s): HGBA1C in the last 72 hours. Lipid Profile No results for input(s): CHOL, HDL, LDLCALC, TRIG, CHOLHDL, LDLDIRECT in the last 72 hours. Thyroid function studies No results for input(s): TSH, T4TOTAL, T3FREE, THYROIDAB in the last 72 hours.  Invalid input(s):  FREET3 Anemia work up No results for input(s): VITAMINB12, FOLATE, FERRITIN, TIBC, IRON, RETICCTPCT in the last 72 hours. Urinalysis    Component Value Date/Time   COLORURINE YELLOW 04/04/2020 1827   APPEARANCEUR CLOUDY (A) 04/04/2020 1827   LABSPEC 1.013 04/04/2020 1827   PHURINE 5.0 04/04/2020 1827   GLUCOSEU NEGATIVE 04/04/2020 1827   HGBUR MODERATE (A) 04/04/2020 1827   BILIRUBINUR NEGATIVE 04/04/2020 1827   KETONESUR NEGATIVE 04/04/2020 1827   PROTEINUR >=  300 (A) 04/04/2020 1827   NITRITE NEGATIVE 04/04/2020 1827   LEUKOCYTESUR LARGE (A) 04/04/2020 1827   Sepsis Labs Invalid input(s): PROCALCITONIN,  WBC,  LACTICIDVEN Microbiology Recent Results (from the past 240 hour(s))  Resp Panel by RT-PCR (Flu A&B, Covid) Nasopharyngeal Swab     Status: None   Collection Time: 07/09/20 12:58 AM   Specimen: Nasopharyngeal Swab; Nasopharyngeal(NP) swabs in vial transport medium  Result Value Ref Range Status   SARS Coronavirus 2 by RT PCR NEGATIVE NEGATIVE Final    Comment: (NOTE) SARS-CoV-2 target nucleic acids are NOT DETECTED.  The SARS-CoV-2 RNA is generally detectable in upper respiratory specimens during the acute phase of infection. The lowest concentration of SARS-CoV-2 viral copies this assay can detect is 138 copies/mL. A negative result does not preclude SARS-Cov-2 infection and should not be used as the sole basis for treatment or other patient management decisions. A negative result may occur with  improper specimen collection/handling, submission of specimen other than nasopharyngeal swab, presence of viral mutation(s) within the areas targeted by this assay, and inadequate number of viral copies(<138 copies/mL). A negative result must be combined with clinical observations, patient history, and epidemiological information. The expected result is Negative.  Fact Sheet for Patients:  EntrepreneurPulse.com.au  Fact Sheet for Healthcare Providers:   IncredibleEmployment.be  This test is no t yet approved or cleared by the [place] FDA and  has been authorized for detection and/or diagnosis of SARS-CoV-2 by FDA under an Emergency Use Authorization (EUA). This EUA will remain  in effect (meaning this test can be used) for the duration of the COVID-19 declaration under Section 564(b)(1) of the Act, 21 U.S.C.section 360bbb-3(b)(1), unless the authorization is terminated  or revoked sooner.       Influenza A by PCR NEGATIVE NEGATIVE Final   Influenza B by PCR NEGATIVE NEGATIVE Final    Comment: (NOTE) The Xpert Xpress SARS-CoV-2/FLU/RSV plus assay is intended as an aid in the diagnosis of influenza from Nasopharyngeal swab specimens and should not be used as a sole basis for treatment. Nasal washings and aspirates are unacceptable for Xpert Xpress SARS-CoV-2/FLU/RSV testing.  Fact Sheet for Patients: EntrepreneurPulse.com.au  Fact Sheet for Healthcare Providers: IncredibleEmployment.be  This test is not yet approved or cleared by the [place] FDA and has been authorized for detection and/or diagnosis of SARS-CoV-2 by FDA under an Emergency Use Authorization (EUA). This EUA will remain in effect (meaning this test can be used) for the duration of the COVID-19 declaration under Section 564(b)(1) of the Act, 21 U.S.C. section 360bbb-3(b)(1), unless the authorization is terminated or revoked.  Performed at [facility], [place] [address], [place], [place] [address]   MRSA PCR Screening     Status: None   Collection Time: 07/09/20  3:51 AM   Specimen: Nasopharyngeal  Result Value Ref Range Status   MRSA by PCR NEGATIVE NEGATIVE Final    Comment:        The GeneXpert MRSA Assay (FDA approved for NASAL specimens only), is one component of a comprehensive MRSA colonization surveillance program. It is not intended to diagnose MRSA infection nor to guide  or monitor treatment for MRSA infections. Performed at [facility], [place] [address] [place], [place] [address]   Culture, blood (routine x 2)     Status: None (Preliminary result)   Collection Time: 07/09/20  4:02 AM   Specimen: BLOOD RIGHT HAND  Result Value Ref Range Status   Specimen Description BLOOD RIGHT  HAND  Final   Special Requests   Final    BOTTLES DRAWN AEROBIC AND ANAEROBIC Blood Culture adequate volume   Culture   Final    NO GROWTH 3 DAYS Performed at [facility], [address], [place], [place] [address]    Report Status PENDING  Incomplete  Culture, blood (routine x 2)     Status: None (Preliminary result)   Collection Time: 07/09/20  4:31 AM   Specimen: BLOOD RIGHT HAND  Result Value Ref Range Status   Specimen Description BLOOD RIGHT HAND  Final   Special Requests   Final    BOTTLES DRAWN AEROBIC AND ANAEROBIC Blood Culture results may not be optimal due to an inadequate volume of blood received in culture bottles   Culture   Final    NO GROWTH 3 DAYS Performed at [facility], [place] [address], [place], [place] [address]    Report Status PENDING  Incomplete   Time spent: 30 min  SIGNED:   Marylu Lund, MD  [facility] 07/12/2020, 12:43 PM  If 7PM-7AM, please contact night-coverage

## 2020-07-12 NOTE — Progress Notes (Addendum)
Progress Note  Patient Name: Charles Mcdonald Date of Encounter: 07/12/2020  [facility] Cardiologist: Sanda Klein, MD   Subjective   Pt feels well, on room air, lying flat at night. He walked length of hallway. Anxious to discharge.  Inpatient Medications    Scheduled Meds: . amiodarone  200 mg Oral Daily  . apixaban  5 mg Oral BID  . aspirin  81 mg Oral Daily  . atorvastatin  40 mg Oral Daily  . Chlorhexidine Gluconate Cloth  6 each Topical Daily  . docusate sodium  100 mg Oral BID  . feeding supplement  237 mL Oral BID BM  . furosemide  40 mg Oral Daily  . insulin aspart  0-20 Units Subcutaneous TID AC & HS  . losartan  12.5 mg Oral Daily  . metoprolol succinate  12.5 mg Oral Daily  . multivitamin with minerals  1 tablet Oral Daily  . pantoprazole (PROTONIX) IV  40 mg Intravenous QHS  . polyethylene glycol  17 g Oral Daily   Continuous Infusions: . sodium chloride 10 mL/hr at 07/10/20 0300   PRN Meds: acetaminophen, docusate, ipratropium-albuterol, ondansetron (ZOFRAN) IV, polyethylene glycol   Vital Signs    Vitals:   07/11/20 2042 07/11/20 2326 07/12/20 0325 07/12/20 0821  BP: 113/74 106/67 103/63 114/66  Pulse: 72 81 74 77  Resp: '20 18 20 20  '$ Temp: 97.8 F (36.6 C) 99.1 F (37.3 C) 97.9 F (36.6 C) 98.2 F (36.8 C)  TempSrc: Oral Oral Oral Oral  SpO2: 100% 99% 100% 100%  Weight:   84.4 kg   Height:        Intake/Output Summary (Last 24 hours) at 07/12/2020 0938 Last data filed at 07/11/2020 2313 Gross per 24 hour  Intake 30.97 ml  Output 1200 ml  Net -1169.03 ml   Last 3 Weights 07/12/2020 07/11/2020 07/10/2020  Weight (lbs) 186 lb 1.1 oz 184 lb 9.6 oz 189 lb 6 oz  Weight (kg) 84.4 kg 83.734 kg 85.9 kg      Telemetry    Sinus rhythm HR 70-80s - Personally Reviewed  ECG    No new tracings - Personally Reviewed  Physical Exam   GEN: No acute distress.   Neck: minimal JVD Cardiac: RRR, no murmurs, rubs, or gallops.  Respiratory:  Clear to auscultation bilaterally. GI: Soft, nontender, non-distended  MS: No edema; No deformity. Neuro:  Nonfocal  Psych: Normal affect   Labs    High Sensitivity Troponin:   Recent Labs  Lab 06/16/20 1637 06/16/20 1808 07/08/20 2356 07/09/20 0156  TROPONINIHS 1,214* 1,810* 26* 47*      Chemistry Recent Labs  Lab 07/08/20 2356 07/09/20 0030 07/11/20 0204 07/11/20 1401 07/12/20 0224  NA 142   < > 138 141 141  K 4.2   < > 3.7 3.7 4.0  CL 111   < > 102 103 103  CO2 20*   < > '28 28 30  '$ GLUCOSE 220*   < > 124* 161* 107*  BUN 24*   < > '23 22 23  '$ CREATININE 2.14*   < > 2.02* 2.08* 2.03*  CALCIUM 9.4   < > 8.7* 9.3 9.2  PROT 6.8  --   --   --  5.3*  ALBUMIN 3.4*  --   --   --  2.6*  AST 21  --   --   --  18  ALT 16  --   --   --  15  ALKPHOS  124  --   --   --  75  BILITOT 0.4  --   --   --  0.7  GFRNONAA 32*   < > 34* 33* 34*  ANIONGAP 11   < > '8 10 8   '$ < > = values in this interval not displayed.     Hematology Recent Labs  Lab 07/08/20 2356 07/09/20 0030 07/09/20 0046 07/09/20 0320 07/09/20 0736  WBC 13.9*  --   --   --  14.6*  RBC 4.36  --   --   --  4.10*  HGB 12.3*   < > 10.9* 10.9* 11.4*  HCT 40.3   < > 32.0* 32.0* 36.7*  MCV 92.4  --   --   --  89.5  MCH 28.2  --   --   --  27.8  MCHC 30.5  --   --   --  31.1  RDW 17.7*  --   --   --  17.5*  PLT 431*  --   --   --  339   < > = values in this interval not displayed.    BNP Recent Labs  Lab 07/08/20 2356  BNP 2,369.9*     DDimer No results for input(s): DDIMER in the last 168 hours.   Radiology    No results found.  Cardiac Studies   Echo 07/09/20:  1. Left ventricular ejection fraction, by estimation, is 20 to 25%. The  left ventricle has severely decreased function. The left ventricle  demonstrates regional wall motion abnormalities.      There is akinesis of the entire anteroseptal, anterior LV walls and  apex. There is severe hypokinesis of all inferoseptal and inferior  LV  walls. The inferolateral wall is moderately hypokinetic. The left  ventricular internal cavity size was  moderately dilated. Left ventricular diastolic parameters are consistent  with Grade II diastolic dysfunction (pseudonormalization).  2. Right ventricular systolic function is mildly reduced. The right  ventricular size is normal. There is moderately elevated pulmonary artery  systolic pressure. The estimated right ventricular systolic pressure is  123456 mmHg.  3. Left atrial size was moderately dilated.  4. The mitral valve is normal in structure. Trivial mitral valve  regurgitation. No evidence of mitral stenosis.  5. The aortic valve is tricuspid. Aortic valve regurgitation is not  visualized. No aortic stenosis is present.  6. The inferior vena cava is normal in size with <50% respiratory  variability, suggesting right atrial pressure of 8 mmHg.    Patient Profile     75 y.o. 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 in the setting of severe hypokalemia, persistent atrial fibrillation on Eliquis, left bundle branch block, hypertension, hyperlipidemia, diabetes, CKD stage III andFournier'sgangrenewho was seen for the evaluation of cardiac arrest  Assessment & Plan    Respiratory arrest with hypoxia - required intubation at admission - now weaned to RA - suspect secondary to CHF exacarbation   ICM Acute on chronic combined heart failure - has been diuresing on 40 mg IV lasix BID --> transitioned to 40 mg PO lasix yesterday - overall net negative > 9L, weight is 186 lbs, up 2 lbs since yesterday but overall down 5 lbs from admission - GDMT has been difficult given soft BP and renal function - continue 12.5 mg metoprolol succinate, 12.5 mg losartan - held  yesterday for BP drop to the 90s following IV lasix - question changing losartan to low dose  spironolactone for better diuresis - can entertain this in the outpatient clinic should he need further diuresis with marginal BP   PAF Chronic anticoagulation - continue amiodarone, BB, and eliquis   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 [place] reviewed cath films yesterday from 07/03/15 ([facility]) - see note 07/11/20 --> no good targets, unable to perform viability study due to ICD in place, if recurrent angina, could consider angiography to look at LCx/OM1 - continue ASA, BB, statin   Hypothyroidism - on synthroid - TSH 17, normal T4   CKD stage III - baseline creatinine near 1.8 - now 2.03 - appears stable   DM A1c 6.4%, down from 9.3% Holding off farxiga due to fournier gangrene (prolonged hospitalization in Nov 2021)   Hyperlipidemia with LDL goal < 70 - 06/17/2020: Cholesterol 123; HDL 28; LDL Cholesterol 75; Triglycerides 99; VLDL 20 - continue 40 mg lipitor --> could consider increasing to 80 mg   Anticipate discharge in the next 24 hrs. I will arrange follow up with our team.      For questions or updates, please contact [place] Please consult [email or web address] for contact info under        Signed, Ledora Bottcher, PA  07/12/2020, 9:38 AM

## 2020-07-12 NOTE — Progress Notes (Signed)
Occupational Therapy Treatment Patient Details Name: Charles Mcdonald MRN: [id number] DOB: 10-Dec-1945 Today's Date: 07/12/2020    History of present illness Pt is a 75 y/o male admitted 4/17 secondary to chest pain and SOB. Pt became unresponsive during transport to hosptial and had acute hypoxic respiratory failure. Pt was intubated on 4/17 and extubated on 4/18. Pt with PMH of VT s/p ICD, VT storm, CKD, a fib, CHF, and CAD.   OT comments  Pt making steady progress towards OT goals this session. Pt received supine in bed ready to mobilize OOB as pt anticipating DC home today. Pt currently requires min guard assist for functional mobility greater than a household distance with RW, and set- up assist for UB ADLs. Pt reports no further questions about education provided last session. Pt on RA with SpO2 >90% during session, HR WFL RR increase to as much as 33 breaths per minute. DC plan remains appropriate, will follow acutely per POC.   Follow Up Recommendations  Supervision - Intermittent    Equipment Recommendations  None recommended by OT    Recommendations for Other Services      Precautions / Restrictions Precautions Precautions: Fall Restrictions Weight Bearing Restrictions: No       Mobility Bed Mobility Overal bed mobility: Modified Independent                  Transfers Overall transfer level: Needs assistance Equipment used: Rolling walker (2 wheeled) Transfers: Sit to/from Stand Sit to Stand: Supervision         General transfer comment: supervision for safety    Balance Overall balance assessment: Needs assistance Sitting-balance support: No upper extremity supported;Feet supported Sitting balance-Leahy Scale: Good Sitting balance - Comments: sitting EOB with supervision with no LOB   Standing balance support: During functional activity;Bilateral upper extremity supported Standing balance-Leahy Scale: Fair Standing balance comment: BUE support  needed for ambulation                           ADL either performed or assessed with clinical judgement   ADL Overall ADL's : Needs assistance/impaired                 Upper Body Dressing : Set up;Sitting Upper Body Dressing Details (indicate cue type and reason): to don posterior gown     Toilet Transfer: Ambulation;Min guard Toilet Transfer Details (indicate cue type and reason): simulated via functional mobility with RW, minguard for safety         Functional mobility during ADLs: Min guard;Rolling walker General ADL Comments: pt improving with activity tolerance able to complete functional mobility greater than a household distance with RW and min guard assist. pt with no questions about education provided, reviewed education and AE with pt likely to DC home with wife today     Vision       Perception     Praxis      Cognition Arousal/Alertness: Awake/alert Behavior During Therapy: WFL for tasks assessed/performed Overall Cognitive Status: Within Functional Limits for tasks assessed                                          Exercises     Shoulder Instructions       General Comments pt on RA with SpO2 >90% during session, HR WFL RR increase to as much  as 33 breaths per minute    Pertinent Vitals/ Pain       Pain Assessment: No/denies pain  Home Living                                          Prior Functioning/Environment              Frequency  Min 2X/week        Progress Toward Goals  OT Goals(current goals can now be found in the care plan section)  Progress towards OT goals: Progressing toward goals  Acute Rehab OT Goals Patient Stated Goal: to go home today OT Goal Formulation: With patient Time For Goal Achievement: 07/24/20 Potential to Achieve Goals: Good  Plan Discharge plan remains appropriate;Frequency remains appropriate    Co-evaluation                 AM-PAC OT "6  Clicks" Daily Activity     Outcome Measure   Help from another person eating meals?: None Help from another person taking care of personal grooming?: None Help from another person toileting, which includes using toliet, bedpan, or urinal?: A Little Help from another person bathing (including washing, rinsing, drying)?: A Little Help from another person to put on and taking off regular upper body clothing?: None Help from another person to put on and taking off regular lower body clothing?: A Little 6 Click Score: 21    End of Session Equipment Utilized During Treatment: Gait belt;Rolling walker  OT Visit Diagnosis: Unsteadiness on feet (R26.81);Muscle weakness (generalized) (M62.81)   Activity Tolerance Patient tolerated treatment well   Patient Left in chair;with call bell/phone within reach   Nurse Communication Mobility status        Time: [id number] OT Time Calculation (min): 28 min  Charges: OT General Charges $OT Visit: 1 Visit OT Treatments $Self Care/Home Management : 23-37 mins  [name], COTA/L Acute Rehabilitation Services [phone]    Precious Haws 07/12/2020, 10:48 AM

## 2020-07-12 NOTE — Progress Notes (Signed)
Physical Therapy Treatment Patient Details Name: Charles Mcdonald MRN: [id number] DOB: [birth year] Today's Date: 07/12/2020    History of Present Illness Pt is a 75 y/o male admitted 4/17 secondary to chest pain and SOB. Pt became unresponsive during transport to hosptial and had acute hypoxic respiratory failure. Pt was intubated on 4/17 and extubated on 4/18. Pt with PMH of VT s/p ICD, VT storm, CKD, a fib, CHF, and CAD.    PT Comments    Pt received in chair, agreeable to therapy session and with good participation and tolerance for gait/stair training and good safety awareness with transfers. Pt needing up to minA for stair training and mostly Supervision for all other functional mobility tasks using RW. Pt at increased risk of falls due to gait speed <0.4 m/s. Pt reports 2/10 modified RPE (fatigue) at end of session and encouraged frequent use of IS (hourly) even post-discharge due to decreased pulmonary endurance (pulling only ~700-900 on IS). VSS on RA. Pt given HEP handout (link below) for strengthening. Pt continues to benefit from PT services to progress toward functional mobility goals. Anticipate pt safe to DC home with support from family once medically cleared.   Follow Up Recommendations  No PT follow up     Equipment Recommendations  None recommended by PT    Recommendations for Other Services       Precautions / Restrictions Precautions Precautions: Fall Precaution Comments: colostomy LLQ Restrictions Weight Bearing Restrictions: No    Mobility  Bed Mobility Overal bed mobility: Modified Independent             General bed mobility comments: pt received in chair and seated EOB with RN present at end of session    Transfers Overall transfer level: Needs assistance Equipment used: Rolling walker (2 wheeled) Transfers: Sit to/from Stand Sit to Stand: Supervision         General transfer comment: supervision for safety, increased time to perform but safe  hand placement for sit<>stand from chair<>EOB  Ambulation/Gait Ambulation/Gait assistance: Supervision Gait Distance (Feet): 150 Feet Assistive device: Rolling walker (2 wheeled) Gait Pattern/deviations: Step-through pattern;Decreased stride length Gait velocity: grossly <0.3 m/s   General Gait Details: no LOB, slow cadence, VSS on RA, good use of RW   Stairs Stairs: Yes Stairs assistance: Min assist Stair Management: One rail Right;Step to pattern;Forwards (HHA on LUE) Number of Stairs: 2 General stair comments: cues for sequencing, pt reports both legs "about the same", good safety/activity pacing, needs minA on hand opposite rail for stability. pt reports he can also use ramp if needed   Wheelchair Mobility    Modified Rankin (Stroke Patients Only)       Balance Overall balance assessment: Needs assistance Sitting-balance support: No upper extremity supported;Feet supported Sitting balance-Leahy Scale: Good Sitting balance - Comments: sitting EOB with no LOB   Standing balance support: During functional activity;Bilateral upper extremity supported Standing balance-Leahy Scale: Fair Standing balance comment: BUE support needed for ambulation                            Cognition Arousal/Alertness: Awake/alert Behavior During Therapy: WFL for tasks assessed/performed Overall Cognitive Status: Within Functional Limits for tasks assessed                                 General Comments: very pleasant and appreciative of education  Exercises Other Exercises Other Exercises: incentive spriometer x 5 - able to pull 700-955m Other Exercises: pt given HEP:([email or web address] Access Code: [email or web address] for supine/seated/standing activities, he agrees to have external support from a family member for safety with standing exercises    General Comments General comments (skin integrity, edema, etc.): SpO2 96-100% with exertion on RA, HR  76-80's bpm with exertion, BP 111/70 (83) prior to session and no dizziness reported      Pertinent Vitals/Pain Pain Assessment: No/denies pain    Home Living                      Prior Function            PT Goals (current goals can now be found in the care plan section) Acute Rehab PT Goals Patient Stated Goal: to go home today PT Goal Formulation: With patient Time For Goal Achievement: 07/24/20 Potential to Achieve Goals: Good Progress towards PT goals: Progressing toward goals    Frequency    Min 3X/week      PT Plan Current plan remains appropriate    Co-evaluation              AM-PAC PT "6 Clicks" Mobility   Outcome Measure  Help needed turning from your back to your side while in a flat bed without using bedrails?: None Help needed moving from lying on your back to sitting on the side of a flat bed without using bedrails?: None Help needed moving to and from a bed to a chair (including a wheelchair)?: A Little Help needed standing up from a chair using your arms (e.g., wheelchair or bedside chair)?: A Little Help needed to walk in hospital room?: A Little Help needed climbing 3-5 steps with a railing? : A Little 6 Click Score: 20    End of Session Equipment Utilized During Treatment: Gait belt Activity Tolerance: Patient tolerated treatment well Patient left: in bed;with call bell/phone within reach;with nursing/sitter in room (seated EOB with RN educator in room) Nurse Communication: Mobility status PT Visit Diagnosis: Unsteadiness on feet (R26.81);Muscle weakness (generalized) (M62.81)     Time: [id number] Time Calculation (min) (ACUTE ONLY): 16 min  Charges:  $Gait Training: 8-22 mins                     Donyell Carrell P., PTA Acute Rehabilitation Services Pager: [phone]: 3[place]4/21/2022, 11:09 AM

## 2020-07-12 NOTE — Consult Note (Signed)
[place] Nurse ostomy follow up Stoma type/location: LLQ colostomy  POuch change with wife present.  They are changing entire pouch when full rather than emptying.  States its too messy to empty. I feel the sideways slant is causing the mess.  We discuss that we will replace the bag pointing down and patient should sit on toilet to empty himself. Using toilet paper "cigar rolls" to clean inside and outside of flap closure.  Due to visual disturbance for wife and dexterity issues of patient, the roll closure of the Hollister pouch would be easier than the plastic clip on the COnvatec pouch they are using.  I have supplied and [employer] catalog with the products I used Ear marked.  1 piece flat pouch and barrier ring.  Stomal assessment/size: 1 1/2" loop colostomy  budded Peristomal assessment: Skin is surprisingly intact. He states sometimes it gets raw and sore.  We discuss MEdical Adhesive related skin injury from pouch changes so frequently (2-3 times daily)   Treatment options for stomal/peristomal skin: barrier ring and 1 piece pouch Output soft brown stool. Was stuck at the top of the bag due to bag being applied sideways.  Patient and wife observed pouch this way in hospital initially and have continued to do it this way.   Ostomy pouching: 1pc. Flat with barrier ring Education provided: See above. WIfe participates in learning. I encourage patient to apply pouch after it has been cut.  He is able with max assist.  Enrolled patient in [facility] Discharge program: Yes SInce I have recommended Hollister products.  I will today.  Will not follow at this time.  Please re-consult if needed. Discharging today, he states.  Domenic Moras MSN, RN, FNP-BC CWON Wound, Ostomy, Continence Nurse Pager [phone]

## 2020-07-12 NOTE — Progress Notes (Addendum)
Heart Failure Nurse Navigator Progress Note  PCP: Joline Salt, PA-C PCP-Cardiologist: Bertrum Sol., MD Admission Diagnosis: CHF Admitted from: home with spouse  Presentation:   [name] presented with CP and SOB, pt intubated in ED. Recently DC for VT and ICD shocks. Pt working with PT ambulating in halls and stairs. Upon start of interview pt sitting on side of bed and interactive in process. Pt daughter fills pill box for him- pt very thankful.  ECHO/ LVEF: 20-25%, G2DD. RV mildly reduced. LA mod dilated, trivial MVR. Similar findings on ECHO 06/16/2020.  Clinical Course:  Past Medical History:  Diagnosis Date  . Acute on chronic respiratory failure with hypoxia ([place])   . Atrial fibrillation, chronic ([place])   . Benign hypertension   . CAD (coronary artery disease), native coronary artery 06/19/2020  . Chronic HFrEF (heart failure with reduced ejection fraction) ([place])   . Chronic kidney disease, stage III (moderate) ([facility])   . Diabetes ([place])   . Gout   . Hyperlipidemia     Social History   Socioeconomic History  . Marital status: Married    Spouse name: Dwan Bolt. [place]  . Number of children: 7  . Years of education: Not on file  . Highest education level: Some college, no degree  Occupational History  . Occupation: Retried  Tobacco Use  . Smoking status: Former Smoker    Packs/day: 0.20    Years: 55.00    Pack years: 11.00    Types: Cigarettes    Quit date: 2012    Years since quitting: 10.3  . Smokeless tobacco: Never Used  Vaping Use  . Vaping Use: Never used  Substance and Sexual Activity  . Alcohol use: Never  . Drug use: Never  . Sexual activity: Not Currently  Other Topics Concern  . Not on file  Social History Narrative  . Not on file   Social Determinants of Health   Financial Resource Strain: High Risk  . Difficulty of Paying Living Expenses: Hard  Food Insecurity: Food Insecurity Present  . Worried About Charity fundraiser in the Last  Year: Sometimes true  . Ran Out of Food in the Last Year: Sometimes true  Transportation Needs: No Transportation Needs  . Lack of Transportation (Medical): No  . Lack of Transportation (Non-Medical): No  Physical Activity: Not on file  Stress: Not on file  Social Connections: Not on file   High Risk Criteria for Readmission and/or Poor Patient Outcomes:  Heart failure hospital admissions (last 6 months): 2   No Show rate: 0%  Difficult social situation: yes  Demonstrates medication adherence: yes  Primary Language: English  Literacy level: able to read/write and comprehend  Education Assessment and Provision:  Detailed education and instructions provided on heart failure disease management including the following:  Signs and symptoms of Heart Failure When to call the physician Importance of daily weights Low sodium diet Fluid restriction Medication management Anticipated future follow-up appointments  Patient education given on each of the above topics.  Patient acknowledges understanding via teach back method and acceptance of all instructions.  Education Materials:  "Living Better With Heart Failure" Booklet, HF zone tool, & Daily Weight Tracker Tool.  Patient has scale at home: yes Patient has pill box at home: yes   Barriers of Care:   -financial strain re: medications/medical cost. Pt has medicare. On fixed SSI.  Considerations/Referrals:   Referral made to Heart Failure Pharmacist Stewardship: yes, to see at [place]  Referral made to Heart & Vascular TOC clinic: yes, 4/25 @ 2pm  Items for Follow-up on DC/TOC: -financial strain -food insecurity -medication regimen/optimizaiton  Pricilla Holm, RN, BSN Heart Failure Nurse Navigator [phone]

## 2020-07-13 ENCOUNTER — Encounter: Payer: Self-pay | Admitting: *Deleted

## 2020-07-13 NOTE — Telephone Encounter (Signed)
This encounter was created in error - please disregard.

## 2020-07-13 NOTE — Telephone Encounter (Signed)
-----   Message from Darreld Mclean, [place] sent at 07/03/2020  1:32 PM EDT ----- Please notify patient of results: Creatinine (which measures kidney function) slightly higher than where it was at discharge but relatively stable. BNP (fluid level) has not come back yet but patient looked volume overloaded on exam yesterday I don't think this will change my plan. I would like to start Lasix '40mg'$  daily and lets have him increase his potassium supplement to 30 mEq daily with this. Will need to repeat BMET in 5-7 days. TSH very elevated at 19.4. I discussed with Dr. Sallyanne Kuster - he recommends starting Synthroid 97mg daily. Patient should follow-up with PCP for further management of this. Can we please fax results to PCP.  Dr. CSallyanne Kusterwould also like to continue Amiodarone '400mg'$  for 2 more weeks and then decrease to '200mg'$  daily.  Also, can we please refer patient to EP. He was seen by Dr. KCaryl Comesin the past. Can we also send a message to the device clinic - patient is switching to our practice and needs to be enrolled in our clinic for management of his ICD.  Thank you so much!

## 2020-07-14 LAB — CULTURE, BLOOD (ROUTINE X 2)
Culture: NO GROWTH
Culture: NO GROWTH
Special Requests: ADEQUATE

## 2020-07-16 ENCOUNTER — Other Ambulatory Visit: Payer: Self-pay

## 2020-07-16 ENCOUNTER — Ambulatory Visit (HOSPITAL_COMMUNITY)
Admit: 2020-07-16 | Discharge: 2020-07-16 | Disposition: A | Discharge status: Home / Self Care | Payer: Medicare Other | Attending: Internal Medicine | Admitting: Internal Medicine

## 2020-07-16 ENCOUNTER — Encounter (HOSPITAL_COMMUNITY): Payer: Self-pay

## 2020-07-16 ENCOUNTER — Telehealth (HOSPITAL_COMMUNITY): Payer: Self-pay

## 2020-07-16 VITALS — BP 102/82 | HR 84 | Wt 186.0 lb

## 2020-07-16 DIAGNOSIS — I482 Chronic atrial fibrillation, unspecified: Secondary | ICD-10-CM

## 2020-07-16 DIAGNOSIS — Z79899 Other long term (current) drug therapy: Secondary | ICD-10-CM | POA: Diagnosis not present

## 2020-07-16 DIAGNOSIS — Z95810 Presence of automatic (implantable) cardiac defibrillator: Secondary | ICD-10-CM | POA: Diagnosis not present

## 2020-07-16 DIAGNOSIS — Z794 Long term (current) use of insulin: Secondary | ICD-10-CM | POA: Diagnosis not present

## 2020-07-16 DIAGNOSIS — I5042 Chronic combined systolic (congestive) and diastolic (congestive) heart failure: Secondary | ICD-10-CM | POA: Insufficient documentation

## 2020-07-16 DIAGNOSIS — N1832 Chronic kidney disease, stage 3b: Secondary | ICD-10-CM | POA: Insufficient documentation

## 2020-07-16 DIAGNOSIS — I2510 Atherosclerotic heart disease of native coronary artery without angina pectoris: Secondary | ICD-10-CM | POA: Diagnosis not present

## 2020-07-16 DIAGNOSIS — Z87891 Personal history of nicotine dependence: Secondary | ICD-10-CM | POA: Insufficient documentation

## 2020-07-16 DIAGNOSIS — I5022 Chronic systolic (congestive) heart failure: Secondary | ICD-10-CM | POA: Diagnosis not present

## 2020-07-16 DIAGNOSIS — I5082 Biventricular heart failure: Secondary | ICD-10-CM | POA: Diagnosis not present

## 2020-07-16 DIAGNOSIS — E039 Hypothyroidism, unspecified: Secondary | ICD-10-CM | POA: Insufficient documentation

## 2020-07-16 DIAGNOSIS — Z8249 Family history of ischemic heart disease and other diseases of the circulatory system: Secondary | ICD-10-CM | POA: Diagnosis not present

## 2020-07-16 DIAGNOSIS — I472 Ventricular tachycardia, unspecified: Secondary | ICD-10-CM

## 2020-07-16 DIAGNOSIS — Z7901 Long term (current) use of anticoagulants: Secondary | ICD-10-CM | POA: Insufficient documentation

## 2020-07-16 DIAGNOSIS — Z79890 Hormone replacement therapy (postmenopausal): Secondary | ICD-10-CM | POA: Insufficient documentation

## 2020-07-16 DIAGNOSIS — E118 Type 2 diabetes mellitus with unspecified complications: Secondary | ICD-10-CM

## 2020-07-16 DIAGNOSIS — I255 Ischemic cardiomyopathy: Secondary | ICD-10-CM | POA: Diagnosis not present

## 2020-07-16 DIAGNOSIS — Z933 Colostomy status: Secondary | ICD-10-CM | POA: Diagnosis not present

## 2020-07-16 DIAGNOSIS — Z7982 Long term (current) use of aspirin: Secondary | ICD-10-CM | POA: Diagnosis not present

## 2020-07-16 DIAGNOSIS — Z955 Presence of coronary angioplasty implant and graft: Secondary | ICD-10-CM | POA: Diagnosis not present

## 2020-07-16 DIAGNOSIS — E1122 Type 2 diabetes mellitus with diabetic chronic kidney disease: Secondary | ICD-10-CM | POA: Insufficient documentation

## 2020-07-16 DIAGNOSIS — I480 Paroxysmal atrial fibrillation: Secondary | ICD-10-CM | POA: Insufficient documentation

## 2020-07-16 DIAGNOSIS — I252 Old myocardial infarction: Secondary | ICD-10-CM | POA: Insufficient documentation

## 2020-07-16 LAB — BASIC METABOLIC PANEL
Anion gap: 18 — ABNORMAL HIGH (ref 5–15)
BUN: 32 mg/dL — ABNORMAL HIGH (ref 8–23)
CO2: 20 mmol/L — ABNORMAL LOW (ref 22–32)
Calcium: 8.7 mg/dL — ABNORMAL LOW (ref 8.9–10.3)
Chloride: 101 mmol/L (ref 98–111)
Creatinine, Ser: 2.13 mg/dL — ABNORMAL HIGH (ref 0.61–1.24)
GFR, Estimated: 32 mL/min — ABNORMAL LOW (ref >60)
Glucose, Bld: 86 mg/dL (ref 70–99)
Potassium: 5.5 mmol/L — ABNORMAL HIGH (ref 3.5–5.1)
Sodium: 139 mmol/L (ref 135–145)

## 2020-07-16 NOTE — Progress Notes (Addendum)
[facility] Transitions of Care [facility]  PCP: Lajoyce Lauber Primary Cardiologist: Sanda Klein  HPI:  Charles Mcdonald is a 75 y.o.  male  with a PMH significant for ischemic cardiomyopathy, chronic combined systolic and diastolic heart failure with severely depressed left ventricular systolic function, extensive CAD not amenable to surgical revascularization, status post Medtronic ICD  Long history of ischemic heart disease.  Had an anterior MI in 2002 which was treated with a stent to the proximal LAD.  EF at that time was noted at 37%.  Follow-up cardiac cath in 2004 showed occlusion of proximal LAD, mid RCA and mid circumflex with collaterals.  Felt to not be a candidate for surgical revascularization. He had a non-STEMI in 2017.  Coronary anatomy was essentially unchanged on cardiac cath and unsuccessful attempt was made at PCI of the LAD.  Subsequent echocardiograms and nuclear stress test since that time had shown varying degrees of LV dysfunction with continued EF of 25 to 30%.  Had initial ICD implant in 2006 for primary prevention.  His high-voltage lead is reportedly "on advisory" (details unknown) and had a GEN change on 06/2018.  At his last intervention an attempt was made to upgrade his device to a biventricular pacemaker/defibrillator but was unsuccessful since passive LV lead repeatedly dislodged.  Plan was to proceed with epicardial lead active fixation of LV lead at a future date but did not happen.  Also has a history of persistent atrial fibrillation and underwent cavotricuspid isthmus ablation in 04/2014.  He was previously followed by Dr. Uvaldo Rising, EP at [place].   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  Device was interrogated during that hospitalization that showed several shocks.  Summary reported episodes of VT that were  "self resolved", as well as synchronized cardioversion for atrial fibrillation with RVR.  Was treated with IV amiodarone and pressors.  He required intubation along with PEG tube placement.  He was discharged to [facility] on 02/2020.  Underwent repeated surgical procedures for groin wounds in January 2022.  Device interrogation showed defibrillator shocks during that time.  It was unclear whether these shocks were acknowledged by the patient as the device was not interrogated during that time.  He was eventually discharged home with continued PEG and colostomy.  He was readmitted to [facility] on 06/16/2020 for VT storm after presenting with diaphoresis and multiple ICD shocks. Reported to receive a total of 103 ICD shocks.  Interrogation of device while in the ED showed lower rate programmed to 60 bpm with 86% atrial sensed, ventricular sensed rhythm and only 14% atrial pacingand0.2% ventricular pacing. Activity level hadbeen very poor throughout the months of November toJanuary when he was hospitalizedandvery low levelsof activity (0.1 hours a day) since the beginning of February when he returned home. There hadbeen no significant atrial fibrillation. Thoracic impedence had been out of range ever since he was initially hospitlaized in November and had never returned to baseline.He had over 100 episodes of VT over 2 hour span before presentation. A total of 55 of 61 episodes were pace terminated and another 73 of 74 episodes were shock terminated with a total of 103 shocks delivered. Multiple episodes of monophoric ventricular tachycardia over 200 bpm were treated as being in the VF zone usually with successful defibrillation with a single shock. ATP never intervened since the arrhythmia was too fast and ATP is  turned off during charge in the VF zone.  VT storm was felt to be secondary to severe hypokalemia with a potassium of 2.1 on admission.  High-sensitivity troponin was elevated and peaked at 1810  which was felt to be demand ischemia in the setting of recurrent ICD shocks.  His electrolytes were corrected and he was started on amiodarone without recurrent VT. echo during admission showed an EF of 20 to 25% with akinesis of the anterior septal wall, anterior wall and apical segments as well as grade 2 diastolic dysfunction. TSH was elevated at 15.865.   Presented back for follow-up in the office on 07/02/2020.  Weight was noted to be up 17 pounds.  No recurrent ICD shocks since discharge. There were plans to start him on lasix but he never received this.  Message was sent to enroll in device clinic, and referred to EP.    Presented to the ED on 4/17 with hypoxemic respiratory failure.  Apparently never received lasix in the outpatient setting.  He was bagged en route and intubated in the ED. Labs on admission showed stable electrolytes, Cr 2.14, BNP 2369, Lactic Acid 4.3, WBC 13.9, hsTn 26>>47. EKG initially called Code STEMI in the ED but canceled by cardiology at the bedside. CXR with edema vs multifocal infiltrate. He was admitted to [facility].  Had a repeat echo which was largely unchanged from 05/2020.  He was started on diuresis, extubated, continued to diurese from 201lbs to 186lbs, losartan reduced to 12.'5mg'$ m, started on potassium 22mq and was discharged home.    Doing well since leaving the hospital.  Has to take his time doing things but denies shortness of breath.  Doing a little gardening, going shopping with his wife.  Using just a cane now.  Denies any chest pain or discomfort.  Denies orthopnea or PND.  Weighing himself at home daily, usually is around 182-183lbs by his scale.  He is 186 here by our scale.  Has not felt any shocks since discharge.  Taking lasix '40mg'$  daily with good urine output.      ROS: All systems negative except as listed in HPI, PMH and Problem List.  SH:  Social History   Socioeconomic History  . Marital status: Married    Spouse name: Charles Mcdonald  .  Number of children: 7  . Years of education: Not on file  . Highest education level: Some college, no degree  Occupational History  . Occupation: Retried  Tobacco Use  . Smoking status: Former Smoker    Packs/day: 0.20    Years: 55.00    Pack years: 11.00    Types: Cigarettes    Quit date: 2012    Years since quitting: 10.3  . Smokeless tobacco: Never Used  Vaping Use  . Vaping Use: Never used  Substance and Sexual Activity  . Alcohol use: Never  . Drug use: Never  . Sexual activity: Not Currently  Other Topics Concern  . Not on file  Social History Narrative  . Not on file   Social Determinants of Health   Financial Resource Strain: High Risk  . Difficulty of Paying Living Expenses: Hard  Food Insecurity: Food Insecurity Present  . Worried About RCharity fundraiserin the Last Year: Sometimes true  . Ran Out of Food in the Last Year: Sometimes true  Transportation Needs: No Transportation Needs  . Lack of Transportation (Medical): No  . Lack of Transportation (Non-Medical): No  Physical Activity: Not on file  Stress: Not on file  Social Connections: Not on file  Intimate Partner Violence: Not on file    FH:  Family History  Problem Relation Age of Onset  . Heart disease Father   . Diabetes Sister     Past Medical History:  Diagnosis Date  . Acute on chronic respiratory failure with hypoxia ([place])   . Atrial fibrillation, chronic ([place])   . Benign hypertension   . CAD (coronary artery disease), native coronary artery 06/19/2020  . Chronic HFrEF (heart failure with reduced ejection fraction) ([place])   . Chronic kidney disease, stage III (moderate) ([facility])   . Diabetes ([place])   . Gout   . Hyperlipidemia     Current Outpatient Medications  Medication Sig Dispense Refill  . allopurinol (ZYLOPRIM) 100 MG tablet Take 100 mg by mouth daily.    Marland Kitchen amiodarone (PACERONE) 200 MG tablet Take 1 tablet (200 mg total) by mouth daily. 30 tablet 0  . apixaban (ELIQUIS) 5 MG  TABS tablet Take 5 mg by mouth every 12 (twelve) hours.    Marland Kitchen ascorbic acid (VITAMIN C) 500 MG tablet Take 500 mg by mouth daily.    Marland Kitchen aspirin EC 81 MG tablet Take 81 mg by mouth as directed. Swallow whole.    Marland Kitchen atorvastatin (LIPITOR) 40 MG tablet Take 40 mg by mouth at bedtime.    . colchicine 0.6 MG tablet Take 0.6 mg by mouth See admin instructions. Take 0.6 mg by mouth as directed for gout flares    . ergocalciferol (VITAMIN D2) 1.25 MG (50000 UT) capsule Take 50,000 Units by mouth once a week.    . furosemide (LASIX) 40 MG tablet Take 1 tablet (40 mg total) by mouth daily. 30 tablet 0  . glucose blood test strip Accu-Chek Aviva Plus test strips    . insulin lispro (HUMALOG) 100 UNIT/ML KwikPen Inject 5 Units into the skin 2 (two) times daily before a meal.    . LANTUS SOLOSTAR 100 UNIT/ML Solostar Pen Inject 5-10 Units into the skin See admin instructions. Inject 10 units into the skin before breakfast and 5 units at bedtime    . losartan (COZAAR) 25 MG tablet Take 0.5 tablets (12.5 mg total) by mouth daily. 15 tablet 0  . metoprolol succinate (TOPROL-XL) 25 MG 24 hr tablet Take 0.5 tablets (12.5 mg total) by mouth daily. 15 tablet 0  . nitroGLYCERIN (NITROSTAT) 0.4 MG SL tablet Place 1 tablet (0.4 mg total) under the tongue every 5 (five) minutes x 3 doses as needed for chest pain. 25 tablet 12  . pantoprazole (PROTONIX) 40 MG tablet Take 40 mg by mouth at bedtime.    . potassium chloride SA (KLOR-CON) 20 MEQ tablet Take 1 tablet (20 mEq total) by mouth daily. 30 tablet 6   No current facility-administered medications for this encounter.    Vitals:   07/16/20 1402  BP: 102/82  Pulse: 84  SpO2: 95%  Weight: 84.4 kg (186 lb)    PHYSICAL EXAM: Cardiac: JVD flat, normal rate and rhythm, clear s1 and s2, no murmurs, rubs or gallops, 1+ LE edema bilaterally Pulmonary: decreased breath sounds left base, otherwise clear, not in distress Abdominal: non distended abdomen, soft and nontender,  colostomy present Psych: Alert, conversant, in good spirits    ASSESSMENT & PLAN:   Chronic combined systolic and diastolic biventricular CHF ICM -longstanding ischemic cardiomyopathy anterior MI in 2002 which was treated with a stent to the proximal LAD.  EF at that time was  noted at 37% -Echocardiograms over the years with continued reduced EF of 25 to 30% last ECHO 06/2020 EF 20-25% akinesis of anteroseptal anterior LV walls and apex, G2DD, mild RV dysfunction -Had an anterior MI in 2002 which was treated with a stent to the proximal LAD.  Follow-up cardiac cath in 2004 showed occlusion of proximal LAD, mid RCA and mid circumflex with collaterals.  Felt to not be a candidate for surgical revascularization. He had a non-STEMI in 2017.  Coronary anatomy was essentially unchanged on cardiac cath and unsuccessful attempt was made at PCI of the LAD -Has known extensive CAD from last reported cath in 2017 with occluded LAD, Lcx and RCA not amenable to PCI or CABG -NYHA class II-III symptoms, volume status okay, he is at his discharge weight, still some peripheral edema and possible left base pleural effusion but JVD completely flat -not much room for titration given low bp and CKD, continue 12.5 mg metoprolol succinate, 12.5 mg losartan, lasix '40mg'$  with 48mq K supplement -Not a good candidate for advanced therapies with age, CKD, RV dysfuncion, luckily he is actually feeling pretty well currently and has not had many issues with volume overload until recently.  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 Mild Hypothyroidism vs subclinical hypothyroidism -prior notes say patient is on synthroid but it appears primary cardiology team was not able to contact him to get this started, we will start this for him at 24m as intended  -may be beneficial having someone else follow this going forward as well, PCP or perhaps an endocrinologist's perspective will need to monitor going forward since patient on amiodarone.     CKD stage IIIb - baseline creatinine near 1.8 - last cr 2.03, repeat bmp today   T2DM -most recent A1c 6.4% -no farxiga with hx of fournier's gangrene 2021   Follow up with general cardiology

## 2020-07-16 NOTE — Telephone Encounter (Signed)
Heart Failure Nurse Navigator Progress Note  Called to confirm Heart & Vascular Transitions of Care appointment at 2pm. Patient reminded to bring all medications and pill box organizer with them. Pt requesting pill organizer/box at appt.  Confirmed patient has transportation. Gave directions, instructed to utilize [place] parking.  Confirmed appointment prior to ending call.   Pricilla Holm, RN, BSN Heart Failure Nurse Navigator [phone]

## 2020-07-16 NOTE — Progress Notes (Signed)
Heart and Vascular Care Navigation  07/16/2020  Charles Mcdonald 1945/05/19 [id number]  Reason for Referral: Patient seen in [facility] clinic.   Engaged with patient face to face for initial visit for Heart and Vascular Care Coordination.                                                                                                   Assessment:  Patient is a 75yo married male who lives in a single family home. He reports the home is owned by his family and they recently moved there from [place]. He has Social Security along with his wife and has [employer]. He stated they receive close to $400 in food stamps which indicates a low combined income for both. They report that finances are tight and they managed to pay all the bills in the home. He states that his co pays vary from $2-18 and unsure if he has any assistance programs.                                       HRT/VAS Care Coordination     Patients Home Cardiology Office --  [facility]   Outpatient Care Team Social Worker   Social Worker Name: Charles Mcdonald, Charles Mcdonald [phone]   Living arrangements for the past 2 months Single Family Home   Lives with: Spouse   Patient Current Insurance Coverage Managed Medicare   Patient Has Concern With Paying Medical Bills Yes   Medical Bill Referrals: Medicaid vs Financial counseling   Does Patient Have Prescription Coverage? Yes   Home Assistive Devices/Equipment Walker (specify type); Cane (specify quad or straight); Grab bars in shower       Social History:                                                                             SDOH Screenings   Alcohol Screen: Low Risk   . Last Alcohol Screening Score (AUDIT): 0  Depression (PHQ2-9): Not on file  Financial Resource Strain: High Risk  . Difficulty of Paying Living Expenses: Hard  Food Insecurity: Food Insecurity Present  . Worried About Charity fundraiser in the Last Year: Sometimes true  . Ran Out of Food in the Last  Year: Sometimes true  Housing: Low Risk   . Last Housing Risk Score: 0  Physical Activity: Not on file  Social Connections: Not on file  Stress: Not on file  Tobacco Use: Medium Risk  . Smoking Tobacco Use: Former Smoker  . Smokeless Tobacco Use: Never Used  Transportation Needs: No Transportation Needs  . Lack of Transportation (Medical): No  . Lack of Transportation (Non-Medical): No    SDOH Interventions: Financial Resources:  Financial Strain Interventions: Other (Comment) (Provided SHIIP to apply for the Extra Help program)   Food Insecurity:  Food Insecurity Interventions: Other (Comment) (Has food stamps but provided food banks to supplement)  Housing Insecurity:   n/a  Transportation:    n/a   Follow-up plan: CSW provided patient and wife with information on [employer] to see if they may be eligible for the extra help program to reduce their premiums and regain some finances in their monthly checks. CSW also discussed some food resources if needed. Patient and wife will follow up with info on [facility] and return call to [place] if needed. Charles Mcdonald, Charles Mcdonald, Charles Mcdonald

## 2020-07-16 NOTE — Patient Instructions (Addendum)
Labs done today, your results will be available in MyChart, we will contact you for abnormal readings.  Thank you for allowing [name] to provider your heart failure care after your recent hospitalization. Please follow-up with [facility] as scheduled on 07/26/20  Do the following things EVERYDAY: 1) Weigh yourself in the morning before breakfast. Write it down and keep it in a log. 2) Take your medicines as prescribed 3) Eat low salt foods--Limit salt (sodium) to 2000 mg per day.  4) Stay as active as you can everyday 5) Limit all fluids for the day to less than 2 liters  Limit your fluid intake to less than 2 liters of fluid per day. Fluid includes all drinks, coffee, juice, ice chips, soup, jello, and all other liquids.  Restrict your sodium intake to less than '2000mg'$  per day. This will help prevent your body from holding onto fluid. Read food labels as a lot of canned and packaged foods have a lot of sodium.  Weigh yourself EVERY morning after you go to the bathroom but before you eat or drink anything. Write this number down in a weight log/diary. If you gain 3 pounds overnight or 5 pounds in a week, call the heart failure clinic

## 2020-07-17 ENCOUNTER — Telehealth (HOSPITAL_COMMUNITY): Payer: Self-pay | Admitting: Licensed Clinical Social Worker

## 2020-07-17 ENCOUNTER — Telehealth (HOSPITAL_COMMUNITY): Payer: Self-pay | Admitting: Internal Medicine

## 2020-07-17 DIAGNOSIS — E039 Hypothyroidism, unspecified: Secondary | ICD-10-CM

## 2020-07-17 MED ORDER — LEVOTHYROXINE SODIUM 25 MCG PO TABS: 25.0000 ug | ORAL_TABLET | Freq: Every day | ORAL | 0 refills | Status: DC

## 2020-07-17 NOTE — Telephone Encounter (Signed)
Spoke with Mr. 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  Also confirmed he was supposed to be on synthroid but they were not able to contact him so will go ahead and start this as well at 57mg.

## 2020-07-17 NOTE — Progress Notes (Addendum)
Cardiology Office Note:    Date:  07/26/2020   ID:  Charles Mcdonald, DOB [birth year], MRN [id number]  PCP:  Joline Salt, PA-C  Cardiologist:  Sanda Klein, MD  Electrophysiologist:  None   Referring MD: Joline Salt, PA-C   Chief Complaint: follow-up of CHF and VT  History of Present Illness:    Charles Mcdonald is a 75 y.o. 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 Sallyanne Kuster and presents today for follow-up of CHF and VT.  Patient has a long history of ischemic heart disease. He had an anterior MI in 2002 which was treated with a stent to the proximal LAD. EF at the time was 37%. Cardiac cath in 2004 showed occlusion of the proximal LAD, mid RCA, and mid LCX with collateral formation. He was "turned down for surgical revascularization" and "refused transplant." There was reportedly no evidence of viability (alhthough unclear what type of study was used to assess this). He had another NSTEMI in 2017. Coronary anatomy was essentially unchanged on cardiac catheterization and an unsuccessful attempt was made at a PCI of the LAD. Subsequent echocardiograms and nuclear stress tests have shown varying degrees of LV systolic dysfunction usually with EF around 25-30%. Initial ICD was implanted in 2006 for primary prevention. His high-voltage lead is reportedly "on advisory" (details unknown) and he had generator change out in 06/2018. At that last intervention, an attempt at upgrading his device to a biventricular pacemaker defibrillator was unsuccessful since the passive LV lead  repeatedly dislodged. Plan was to go ahead with an epicardial lead or an active fixation of LV lead at a future date but this did not happen. His current device is a [employer] systems engineer CRT-D device but the LV lead port is pinned and the device is programmed as a conventional dual chamber defibrillator. He also has a history of persistent atrial fibrillation and underwent cavotricuspid isthmus ablation in 04/2014. He is followed by Dr. Uvaldo Rising, EP at [facility]. However, he has not been seen by Dr. Uvaldo Rising since 09/2018.   Patient was hospitalized at [facility] in November/December 2021 with septic shock due to Fournier's gangrene and required repeat surgical debridement. His device was interrogated during this hospitalization and showed several shocks just before the device was interrogated. Discharge summary from that hospitalization reports episodes of VT that "self resolved" (Dr. Sallyanne Kuster suspect resolved with ICD shock) as well as synchronized cardioversion for atrial fibrillation with RVR. He was treated with IV Amiodarone and IV pressors (at times was on 3 different pressors) for shock. He required endotracheal intubation mechanical ventilation for a long period of time. He also required temporary peg tube feeding. Echo during admission showed LVEF of 25-30%. He was discharged to [facility] on 02/29/2020. He underwent repeat surgical procedures on 03/27/2020 for his groin wounds. He received 6 defibrillator shocks in early 03/2020. It was unclear whether these shocks were acknowledge by the patient as the device was not interrogated. It is possible that he was under anesthesia or sedated when they occurred. He was noted  to have an acute CHF exacerbation during that hospitalization. He required intubation again due to respiratory failure. Cardiology was consulted and he was seen by Dr. Doylene Canard. He ended up testing positive for COVID and was felt to have secondary bacterial  pneumonia. He was treated with antibiotics. He has since returned home. However, he still has PEG and colostomy in left lower quadrant.  He then relocated to [place]. He was readmitted to [facility] from 06/16/2020  to 06/19/2020 for VT storm after presenting with diaphoresis and multiple ICD shocks. He denied any chest pain, dyspnea palpitations, or syncope though. He never did lose consciousness. He received a total a 103 ICD shocks while awaiting medicare in the ED. Interrogation of device in the ED showed lower rate programmed to 60 bpm with 86% atrial sensed, ventricular sensed rhythm and only 14% atrial pacing and 0.2% ventricular pacing.Activity level had been very poor throughout the months of November to January when he was hospitalized and very low levels of activity (0.1 hours a day) since the beginning of February when he returned home. There had been no significant atrial fibrillation. Thoracic impedence had been out of range ever since he was initially hospitlaized in November and had never returned to baseline. He had over 100 episodes of VT over 2 hour span before presentation. A total of 55 of 61 episodes were pace terminated and another 73 of 74 episodes were shock terminated with a total of 103 shocks delivered. Multiple episodes of monophoric ventricular tachycardia over 200 bpm were treated as being in the VF zone usually with successful defibrillation with a single shock. ATP never intervened since the arrhythmia was too fast and ATP is turned off during charge in the VF zone.  VT storm felt to be secondary to severe hypokalemia with potassium of 2.1 on admission (although contribution of ischemia could not be excluded). Magnesium was 1.7. VT was monomorphic suggesting scare as the substrate but the episodes were triggered by brief ventricular couplets. High-sensitivity troponin elevated and peaked at 1,810 which was suspected given so many ICD shocks. Not felt to be ACS. Potassium was  repleted and he was started on IV Amiodarone and VT improved. Diuretics were resumed after hypokalemia was corrected for protracted episode of mild systolic CHF over the last several months. Echo during admission showed LVEF of 20-25% with akinesis of the anteroseptal wall, anterior wall, and apical segment as well as grade 2 diastolic dysfunction. Of note, TSH was markedly elevated at 15,865 but free T4 was normal. TSH was felt to be a little too high for sick euthyroid syndrome. Patient was discharged on Amiodarone '400mg'$  dialy, Toprol-XL '25mg'$  daily, Losartan '25mg'$  daily, Potassium chloride 40 mEq twice daily, Aspirin '81mg'$  daily, and Eliquis '5mg'$  twice daily. Will need to monitor TSH closely on Amiodarone. Close follow-up was arranged in clinic. Note, after numerous ICD shocks, device longevity is only expected to be 15 months. Also need to clarify why his high-voltage lead is "under advisory."  Patient was seen by me for follow-up on 07/02/2020 at which time he denied any recurrent ICD shocks. He noted chronic shortness of breath and lower extremity; however, patient felt these symptoms were stable. However, he was up 17 lbs from discharge. Lasix '40mg'$  daily was added. Plan was to continue Amiodarone '400mg'$  daily for another week and then decrease to '200mg'$  daily. Synthroid was also started due to markedly elevated TSH. Patient was also referred to EP.  Unfortunately, patient never started the Lasix and he was  readmitted from 07/08/2020 to 07/12/2020 for acute hypoxemic respiratory failure requiring intubation. This was felt to be secondary to acute on chronic CHF. Code STEMI was initially called in the ED but was cancelled by Cardiology at bedside. Chest x-ray showed edema vs multifocal infiltrate. He was diuresed with IV Lasix and able to be extubated. Repeat Echo during admission showed LVEF of 20-25% with multiple wall motion abnormalities and grade 2 diastolic CHF. RV was normal in size with mildly reduced  systolic function and moderately elevated RVSP of 55.1 mmHg. There was initially concern that he may have had recurrent VT contributing to presentation. However, interrogation of device showed no recurrent VT. Discharge weight was 186 lbs (down from 201 lbs on admission). He was discharge on Lasix '40mg'$  daily (with KCl 20 mEq daily). Losartan was decreased to 12.'5mg'$  daily due to soft BP.   He was seen by Dr. Shan Levans in the [facility] on 07/16/2020 at which time he was doing relatively well. Weight was stable at home and he was compliant with his Lasix. GDMT was not able to be increased due to soft BP.  Patient presents today for follow-up. Patient here with his wife. He reports doing well since his last office visit. He has some dyspnea if he moves too quickly but states this has been a problem for years and is stable. No orthopnea or PND. He has some chronic lower extremity edema but he states this is stable. Weight stable at home. No chest pain. No palpitations, lightheadedness, dizziness, syncope. No ICD shocks. No abnormal bleeding on Eliquis. He states he is compliant with all his medications except the Synthroid which he states he never received. We will resend this to Pharmacy today.  Past Medical History:  Diagnosis Date  . Acute on chronic respiratory failure with hypoxia ([place])   . Atrial fibrillation, chronic ([place])   . Benign hypertension   . CAD (coronary artery disease), native coronary artery 06/19/2020  . Chronic HFrEF (heart failure with reduced ejection fraction) ([place])   . Chronic kidney disease, stage III (moderate) (H[facility])   . Diabetes ([place])   . Gout   . Hyperlipidemia     Past Surgical History:  Procedure Laterality Date  . IR THORACENTESIS ASP PLEURAL SPACE W/IMG GUIDE  04/17/2020  . SKIN SPLIT GRAFT Bilateral 03/27/2020   Procedure: SKIN GRAFT SPLIT THICKNESS;  Surgeon: Cindra Presume, MD;  Location: [facility];  Service: Plastics;  Laterality: Bilateral;  . WOUND  EXPLORATION Bilateral 03/27/2020   Procedure: Exploration bilateral groin wounds with partial closure;  Surgeon: Cindra Presume, MD;  Location: [facility];  Service: Plastics;  Laterality: Bilateral;    Current Medications: Current Meds  Medication Sig  . allopurinol (ZYLOPRIM) 100 MG tablet Take 100 mg by mouth daily.  Marland Kitchen amiodarone (PACERONE) 200 MG tablet Take 1 tablet (200 mg total) by mouth daily.  Marland Kitchen apixaban (ELIQUIS) 5 MG TABS tablet Take 5 mg by mouth every 12 (twelve) hours.  Marland Kitchen ascorbic acid (VITAMIN C) 500 MG tablet Take 500 mg by mouth daily.  Marland Kitchen aspirin EC 81 MG tablet Take 81 mg by mouth as directed. Swallow whole.  Marland Kitchen atorvastatin (LIPITOR) 40 MG tablet Take 40 mg by mouth at bedtime.  . colchicine 0.6 MG tablet Take 0.6 mg by mouth See admin instructions. Take 0.6 mg by mouth as directed for gout flares  . ergocalciferol (VITAMIN D2) 1.25 MG (50000 UT) capsule Take 50,000 Units by mouth once a week.  Marland Kitchen  furosemide (LASIX) 40 MG tablet Take 1 tablet (40 mg total) by mouth daily.  Marland Kitchen glucose blood test strip Accu-Chek Aviva Plus test strips  . insulin lispro (HUMALOG) 100 UNIT/ML KwikPen Inject 5 Units into the skin 2 (two) times daily before a meal.  . LANTUS SOLOSTAR 100 UNIT/ML Solostar Pen Inject 5-10 Units into the skin See admin instructions. Inject 10 units into the skin before breakfast and 5 units at bedtime  . losartan (COZAAR) 25 MG tablet Take 0.5 tablets (12.5 mg total) by mouth daily.  . metoprolol succinate (TOPROL-XL) 25 MG 24 hr tablet Take 0.5 tablets (12.5 mg total) by mouth daily.  . nitroGLYCERIN (NITROSTAT) 0.4 MG SL tablet Place 1 tablet (0.4 mg total) under the tongue every 5 (five) minutes x 3 doses as needed for chest pain.  . pantoprazole (PROTONIX) 40 MG tablet Take 40 mg by mouth at bedtime.  . [DISCONTINUED] levothyroxine (SYNTHROID) 25 MCG tablet Take 1 tablet (25 mcg total) by mouth daily before breakfast.  . [DISCONTINUED] potassium chloride SA (KLOR-CON) 20  MEQ tablet Take 1.5 tablets (30 mEq total) by mouth daily.     Allergies:   Patient has no known allergies.   Social History   Socioeconomic History  . Marital status: Married    Spouse name: Dwan Bolt. [place]  . Number of children: 7  . Years of education: Not on file  . Highest education level: Some college, no degree  Occupational History  . Occupation: Retried  Tobacco Use  . Smoking status: Former Smoker    Packs/day: 0.20    Years: 55.00    Pack years: 11.00    Types: Cigarettes    Quit date: 2012    Years since quitting: 10.3  . Smokeless tobacco: Never Used  Vaping Use  . Vaping Use: Never used  Substance and Sexual Activity  . Alcohol use: Never  . Drug use: Never  . Sexual activity: Not Currently  Other Topics Concern  . Not on file  Social History Narrative  . Not on file   Social Determinants of Health   Financial Resource Strain: High Risk  . Difficulty of Paying Living Expenses: Hard  Food Insecurity: Food Insecurity Present  . Worried About Charity fundraiser in the Last Year: Sometimes true  . Ran Out of Food in the Last Year: Sometimes true  Transportation Needs: No Transportation Needs  . Lack of Transportation (Medical): No  . Lack of Transportation (Non-Medical): No  Physical Activity: Not on file  Stress: Not on file  Social Connections: Not on file     Family History: The patient's family history includes Diabetes in his sister; Heart disease in his father.  ROS:   Please see the history of present illness.     EKGs/Labs/Other Studies Reviewed:    The following studies were reviewed today:  Echocardiogram 07/09/2020: Impressions: 1. Left ventricular ejection fraction, by estimation, is 20 to 25%. The  left ventricle has severely decreased function. The left ventricle  demonstrates regional wall motion abnormalities.      There is akinesis of the entire anteroseptal, anterior LV walls and  apex. There is severe hypokinesis of  all inferoseptal and inferior LV  walls. The inferolateral wall is moderately hypokinetic. The left  ventricular internal cavity size was  moderately dilated. Left ventricular diastolic parameters are consistent  with Grade II diastolic dysfunction (pseudonormalization).  2. Right ventricular systolic function is mildly reduced. The right  ventricular size is normal. There  is moderately elevated pulmonary artery  systolic pressure. The estimated right ventricular systolic pressure is  123456 mmHg.  3. Left atrial size was moderately dilated.  4. The mitral valve is normal in structure. Trivial mitral valve  regurgitation. No evidence of mitral stenosis.  5. The aortic valve is tricuspid. Aortic valve regurgitation is not  visualized. No aortic stenosis is present.  6. The inferior vena cava is normal in size with <50% respiratory  variability, suggesting right atrial pressure of 8 mmHg.   Comparison(s): Compared to prior study on 06/16/20, the PASP is now 30mHg  (previously 318mg).    EKG:  EKG not ordered today.   Recent Labs: 07/08/2020: B Natriuretic Peptide 2,369.9 07/09/2020: Hemoglobin 11.4; Platelets 339; TSH 17.355 07/12/2020: ALT 15; Magnesium 1.9 07/23/2020: BUN 30; Creatinine, Ser 2.10; Potassium 4.1; Sodium 139  Recent Lipid Panel    Component Value Date/Time   CHOL 123 06/17/2020 0123   TRIG 99 06/17/2020 0123   HDL 28 (L) 06/17/2020 0123   CHOLHDL 4.4 06/17/2020 0123   VLDL 20 06/17/2020 0123   LDLCALC 75 06/17/2020 0123    Physical Exam:    Vital Signs: BP 118/72   Pulse 84   Ht 6' (1.829 m)   Wt 188 lb 9.6 oz (85.5 kg)   SpO2 97%   BMI 25.58 kg/m     Wt Readings from Last 3 Encounters:  07/26/20 188 lb 9.6 oz (85.5 kg)  07/16/20 186 lb (84.4 kg)  07/12/20 186 lb 1.1 oz (84.4 kg)     General: [age].o. African-American male in no acute distress. HEENT: Normocephalic and atraumatic. Sclera clear. EOMs intact. Neck: Supple. No carotid bruits. No  JVD. Heart: RRR. Distinct S1 and S2 with possible gallop. No murmur or rubs. Radial pulses 2+ and equal bilaterally. Lungs: No increased work of breathing. Absent/diminished breath sounds in left base. No wheezes, crackles, or rales. Abdomen: Soft, non-distended, and non-tender to palpation. Colostomy and PEG tube in place.  Extremities: 1+ lower extremity edema bilaterally, worse around the ankles (right leg slightly larger than left).  Skin: Warm and dry. Neuro: Alert and oriented x3. No focal deficits. Psych: Normal affect. Responds appropriately.  Assessment:    1. Chronic systolic CHF (congestive heart failure) ([facility]  2. Ischemic cardiomyopathy   3. Pleural effusion on left   4. Coronary artery disease involving native coronary artery of native heart without angina pectoris   5. VT (ventricular tachycardia) (H[facility])   6. Persistent atrial fibrillation ([facility]  7. LBBB (left bundle branch block)   8. S/P ICD (internal cardiac defibrillator) procedure   9. Primary hypertension   10. Hyperlipidemia, unspecified hyperlipidemia type   11. Type 2 diabetes mellitus with complication, without long-term current use of insulin ([facility]  12. Stage 3b chronic kidney disease ([facility]  13. Fournier's gangrene   14. Hypothyroidism, unspecified type     Plan:    Chronic Systolic CHF Ischemic Cardiomyopathy - Echo on 07/09/2020 during recent admission showed LVEF of 20-25% with multiple wall motion abnormalities and grade 2 diastolic CHF. RV was normal in size with mildly reduced systolic function and moderately elevated RVSP of 55.1 mmHg. - Volume status okay. Chronic lower extremity edema but no JVD or crackles on exam. Weights are stable at home. - Continue Lasix '40mg'$  daily with K-Dur 20 mEq daily. - Continue Losartan 12.5 mg daily. - Continue Toprol-XL 12.5 mg daily.  - No SGLT2 inhibitors due to history of Fournier's gangrene.  -  BP slightly better today but hesitant to increase GDMT due to  history soft BP. He has required Midodrine in the past. Have asked patient to keep a log of BP and HR for 2-3 weeks and then send this to [name]. May be able to increase Toprol-XL some depending on readings.  - Discussed importance of daily weights and sodium/fluid restrictions. Patient to call us if he gains 3lbs in a day or 5lbs in a week. Also discussed compression stockings for lower extremity edema. His right leg is slightly larger than the left. No calf pain or erythema. He is compliant with Eliquis so I think DVT is unlikely but asked patient to let us know if asymmetry worsens.  Left Pleural Effusion - Chest x-ray during recent admission showed possible small to moderate left pleural effusion.  - Patient has absent breath sounds in left base. However, he denies any worsening shortness of breath from baseline. - Will repeat chest x-ray for further evaluation. Continue Lasix as above.  CAD - History of anterior MI in 2002 s/p stenting to proximal LAD. Has not been felt to have good revascularization options since then as noted above in HPI. - No angina. - Continue aspirin, beta-blocker, and high-intensity statin.  VT - Recently admitted for VT storm in the setting of hypokalemia (althoug cannot exclude contribution of ischemia). Had 103 ICD shocks in under 2 hours. VT was monomorphic, suggesting scar as the substrate, but the episodes were triggered by brief ventricular couplets.  - No recurrent ICD shocks since discharge. No palpitations.   - Continue Amiodarone '200mg'$  daily.   - Continue Toprol-XL 12.'5mg'$  daily.   Persistent Atrial Fibrillation - Maintaining sinus rhythm. - Continue Amiodarone and Toprol-XL as above. - Continue Eliquis '5mg'$  twice daily.  Chronic LBBB - Notes in Care Everywhere mention unsuccessful attempts at CRT upgrade due to repeated dislodgment of the coronary sinus passive lead. Consideration has been given to use use of an epicardial lead or an active fixation  coronary sinus leads.   S/p Medtronic ICD - Patient has Medtronic CRT-D device with LV port pinned. Previously followed by Dr. Uvaldo Rising, EP at [facility]. Per notes in Care Everywhere, high-voltage lead is reportedly "on advisory" (details unknown). However, RA and RV lead parameters normal on recent interrogation. I personally spoke with Renae Fickle, Medtronic Rep, after last visit about the high voltage lead on advisory. He said he has a 69/49 lead which are more prone to fracture but as long as device checks are OK, it should be fine. He also mentioned that he has a BiV ICD but the LV lead was not showing. However, it is known that his LV lead port is pinned and the device is programmed as a conventional dual chamber defibrillator. After numerous recent ICD shocks, device longevity only about 15 months.  - Have request records from Dr. Tomasita Crumble (patient's Cardiologist at [facility]).  - He was referred to our [facility] and EP following last visit but has not been seen by either yet. Scheduled to see Dr. Caryl Comes later this month.   Hypertension - BP well controlled. - Continue medications for CHF as above.  Hyperlipidemia - Recent lipid panel from 06/17/2020: Total Cholesterol 123, Triglycerides 99, HDL 28, LDL 75. - Continue Lipitor '40mg'$  daily.  Diabetes Mellitus - Management per PCP.  CKD Stage III - Creatinine 2.10 on 07/23/2020. Patient's new baseline seems to be around 1.8 to 2.1.  Fournier's Gangrene - S/p multiple interventions from  November 2021 to January 2022. - Still has colostomy and PEG tube in place.  Hypothyroidism vs Subclinical Hypothyroidism - TSH 19.4 at last visit. Free T4 normal. - Synthroid 84mg daily prescribed at last visit; however, he states he never started this because it was not at his Pharmacy. Will resend today. - Will need to follow-up with PCP for further management of this. He is scheduled to see his PCP  tomorrow.  Disposition: Follow up in 3 months with Dr. CSallyanne Kuster  ADDENDUM 08/14/2020 10:31AM: [facility] already summarized thoroughly above. He does also have a history of atrial flutter and underwent atrial flutter ablation in 05/2014. Note above mentions he has not been seen by Dr. BUvaldo Risingsince 09/2018. However, he has been seen by NLeana Gamer NP with EP since then (mostly recently 06/2019) as well as Dr. TShirlee Limerickwith General Cardiology (most recently in 05/2020). Per Dr. BLoni Muselast note in 05/2020, all GDMT for heart failure had previously been discontinued due to hypotension and patient was on Midodrine. Low dose Metoprolol was started at that visit but Midodrine was continued. Patient no longer on Midodrine and we have been able to add low dose Losartan as well as noted above. No other significant changes. No change to plan above.   Medication Adjustments/Labs and Tests Ordered: Current medicines are reviewed at length with the patient today.  Concerns regarding medicines are outlined above.  Orders Placed This Encounter  Procedures  . DG Chest 2 View   Meds ordered this encounter  Medications  . levothyroxine (SYNTHROID) 25 MCG tablet    Sig: Take 1 tablet (25 mcg total) by mouth daily before breakfast.    Dispense:  30 tablet    Refill:  1  . potassium chloride SA (KLOR-CON) 20 MEQ tablet    Sig: Take 1 tablet (20 mEq total) by mouth daily.    Dispense:  45 tablet    Refill:  6    Please note dose change. No refills needed.    Order Specific Question:   Supervising Provider    Answer:   RSkeet Latch[ST:[id number]   Patient Instructions  Medication Instructions:  Levothyroxine refilled.   *If you need a refill on your cardiac medications before your next appointment, please call your pharmacy*   Lab Work: None ordered.   Testing/Procedures: Chest X ray -  To be scheduled.    Follow-Up: [facility] and [facility] needs [facility] our priority.  As part of our continuing mission to provide you with exceptional heart care, we have created designated Provider Care Teams.  These Care Teams include your primary Cardiologist (physician) and Advanced Practice Providers (APPs -  Physician Assistants and Nurse Practitioners) who all work together to provide you with the care you need, when you need it.  We recommend signing up for the patient portal called "MyChart".  Sign up information is provided on this After Visit Summary.  MyChart is used to connect with patients for Virtual Visits (Telemedicine).  Patients are able to view lab/test results, encounter notes, upcoming appointments, etc.  Non-urgent messages can be sent to your provider as well.   To learn more about what you can do with MyChart, go to [email or web address]    Your next appointment:   3-4 month(s)  The format for your next appointment:   In Person  Provider:   MSanda Klein MD   Other Instructions Keep a log of daily blood  pressure and heart rate for 2-3 weeks, send results to office via [place] or phone. Keep appointment with Virl Axe on 5/27.     Signed, Darreld Mclean, PA-C  07/26/2020 3:07 PM    [facility]

## 2020-07-17 NOTE — Telephone Encounter (Signed)
Patient and wife called to shared that he needs to get his colostomy bags and the cost is $17.26 for 10 bags and they are unable to afford. Patient and wife completed [employer] call and application for extra help. CSW will meet with patient on Monday when he comes in for a lab appointment to assist further with full Medicaid application and financial assistance with obtaining the colostomy bags.  Raquel Sarna, [place], [place]

## 2020-07-19 ENCOUNTER — Other Ambulatory Visit: Payer: Self-pay

## 2020-07-19 DIAGNOSIS — Z95810 Presence of automatic (implantable) cardiac defibrillator: Secondary | ICD-10-CM

## 2020-07-19 MED ORDER — POTASSIUM CHLORIDE CRYS ER 20 MEQ PO TBCR: 30.0000 meq | EXTENDED_RELEASE_TABLET | Freq: Every day | ORAL | 6 refills | Status: DC

## 2020-07-23 ENCOUNTER — Ambulatory Visit (HOSPITAL_COMMUNITY)
Admission: RE | Admit: 2020-07-23 | Discharge: 2020-07-23 | Disposition: A | Discharge status: Home / Self Care | Payer: Medicare Other | Source: Ambulatory Visit | Attending: *Deleted | Admitting: *Deleted

## 2020-07-23 ENCOUNTER — Other Ambulatory Visit: Payer: Self-pay

## 2020-07-23 DIAGNOSIS — Z79899 Other long term (current) drug therapy: Secondary | ICD-10-CM | POA: Diagnosis present

## 2020-07-23 DIAGNOSIS — I482 Chronic atrial fibrillation, unspecified: Secondary | ICD-10-CM | POA: Insufficient documentation

## 2020-07-23 LAB — BASIC METABOLIC PANEL
Anion gap: 9 (ref 5–15)
BUN: 30 mg/dL — ABNORMAL HIGH (ref 8–23)
CO2: 27 mmol/L (ref 22–32)
Calcium: 9.5 mg/dL (ref 8.9–10.3)
Chloride: 103 mmol/L (ref 98–111)
Creatinine, Ser: 2.1 mg/dL — ABNORMAL HIGH (ref 0.61–1.24)
GFR, Estimated: 33 mL/min — ABNORMAL LOW (ref >60)
Glucose, Bld: 79 mg/dL (ref 70–99)
Potassium: 4.1 mmol/L (ref 3.5–5.1)
Sodium: 139 mmol/L (ref 135–145)

## 2020-07-23 NOTE — Progress Notes (Signed)
CSW met with patient in the clinic. Patient reports he is not eligible for medicaid to assist with colostomy supplies. Patient has a co pay of 17.26 for a box of 10. Patient reports he is struggling with covering the costs of the supplies. CSW contacted [facility] and assisted with financial support for a box of supplies. Patient will pick up this afternoon. Patient and wife will contact CSW in the future to assist with supplies as needed. Raquel Sarna, [place], [place]

## 2020-07-23 NOTE — Addendum Note (Signed)
Encounter addended by: Louann Liv, LCSW on: 07/23/2020 4:32 PM  Actions taken: Pend clinical note

## 2020-07-26 ENCOUNTER — Encounter: Payer: Self-pay | Admitting: Student

## 2020-07-26 ENCOUNTER — Ambulatory Visit (INDEPENDENT_AMBULATORY_CARE_PROVIDER_SITE_OTHER): Payer: Medicare Other | Admitting: Student

## 2020-07-26 ENCOUNTER — Ambulatory Visit
Admission: RE | Admit: 2020-07-26 | Discharge: 2020-07-26 | Disposition: A | Discharge status: Home / Self Care | Payer: Medicare Other | Source: Ambulatory Visit | Attending: Student | Admitting: Student

## 2020-07-26 ENCOUNTER — Other Ambulatory Visit: Payer: Self-pay

## 2020-07-26 VITALS — BP 118/72 | HR 84 | Ht 72.0 in | Wt 188.6 lb

## 2020-07-26 DIAGNOSIS — I10 Essential (primary) hypertension: Secondary | ICD-10-CM

## 2020-07-26 DIAGNOSIS — I255 Ischemic cardiomyopathy: Secondary | ICD-10-CM

## 2020-07-26 DIAGNOSIS — I447 Left bundle-branch block, unspecified: Secondary | ICD-10-CM

## 2020-07-26 DIAGNOSIS — J90 Pleural effusion, not elsewhere classified: Secondary | ICD-10-CM

## 2020-07-26 DIAGNOSIS — E118 Type 2 diabetes mellitus with unspecified complications: Secondary | ICD-10-CM

## 2020-07-26 DIAGNOSIS — I5022 Chronic systolic (congestive) heart failure: Secondary | ICD-10-CM | POA: Diagnosis not present

## 2020-07-26 DIAGNOSIS — I2510 Atherosclerotic heart disease of native coronary artery without angina pectoris: Secondary | ICD-10-CM | POA: Diagnosis not present

## 2020-07-26 DIAGNOSIS — N1832 Chronic kidney disease, stage 3b: Secondary | ICD-10-CM

## 2020-07-26 DIAGNOSIS — I4819 Other persistent atrial fibrillation: Secondary | ICD-10-CM

## 2020-07-26 DIAGNOSIS — E785 Hyperlipidemia, unspecified: Secondary | ICD-10-CM

## 2020-07-26 DIAGNOSIS — I472 Ventricular tachycardia, unspecified: Secondary | ICD-10-CM

## 2020-07-26 DIAGNOSIS — N493 Fournier gangrene: Secondary | ICD-10-CM

## 2020-07-26 DIAGNOSIS — E039 Hypothyroidism, unspecified: Secondary | ICD-10-CM

## 2020-07-26 DIAGNOSIS — Z95810 Presence of automatic (implantable) cardiac defibrillator: Secondary | ICD-10-CM

## 2020-07-26 MED ORDER — LEVOTHYROXINE SODIUM 25 MCG PO TABS: 25.0000 ug | ORAL_TABLET | Freq: Every day | ORAL | 1 refills | Status: DC

## 2020-07-26 MED ORDER — POTASSIUM CHLORIDE CRYS ER 20 MEQ PO TBCR: 20.0000 meq | EXTENDED_RELEASE_TABLET | Freq: Every day | ORAL | 6 refills | Status: DC

## 2020-07-26 NOTE — Patient Instructions (Addendum)
Medication Instructions:  Levothyroxine refilled.   *If you need a refill on your cardiac medications before your next appointment, please call your pharmacy*   Lab Work: None ordered.   Testing/Procedures: Chest X ray -  To be scheduled.    Follow-Up: At [facility], you and your health needs are our priority.  As part of our continuing mission to provide you with exceptional heart care, we have created designated Provider Care Teams.  These Care Teams include your primary Cardiologist (physician) and Advanced Practice Providers (APPs -  Physician Assistants and Nurse Practitioners) who all work together to provide you with the care you need, when you need it.  We recommend signing up for the patient portal called "MyChart".  Sign up information is provided on this After Visit Summary.  MyChart is used to connect with patients for Virtual Visits (Telemedicine).  Patients are able to view lab/test results, encounter notes, upcoming appointments, etc.  Non-urgent messages can be sent to your provider as well.   To learn more about what you can do with MyChart, go to [email or web address].    Your next appointment:   3-4 month(s)  The format for your next appointment:   In Person  Provider:   Sanda Klein, MD   Other Instructions Keep a log of daily blood pressure and heart rate for 2-3 weeks, send results to office via [place] or phone. Keep appointment with Virl Axe on 5/27.

## 2020-07-26 NOTE — Progress Notes (Signed)
Thanks, Callie. He is tough. Has 109 lives.

## 2020-07-30 ENCOUNTER — Telehealth (HOSPITAL_COMMUNITY): Payer: Self-pay | Admitting: Licensed Clinical Social Worker

## 2020-07-30 ENCOUNTER — Telehealth: Payer: Self-pay | Admitting: Student

## 2020-07-30 MED ORDER — LOSARTAN POTASSIUM 25 MG PO TABS
12.5000 mg | ORAL_TABLET | Freq: Every day | ORAL | 2 refills | Status: DC
Start: 2020-07-30 — End: 2020-08-29

## 2020-07-30 MED ORDER — AMIODARONE HCL 200 MG PO TABS: 200.0000 mg | ORAL_TABLET | Freq: Every day | ORAL | 2 refills | Status: DC

## 2020-07-30 NOTE — Telephone Encounter (Signed)
*  STAT* If patient is at the pharmacy, call can be transferred to refill team.   1. Which medications need to be refilled? (please list name of each medication and dose if known)  losartan (COZAAR) 25 MG tablet amiodarone (PACERONE) 200 MG tablet   2. Which pharmacy/location (including street and city if local pharmacy) is medication to be sent to? [place], [address]  3. Do they need a 30 day or 90 day supply? 30 day    Patient runs out Wednesday

## 2020-07-30 NOTE — Telephone Encounter (Signed)
Patient's wife called for assistance with colostomy bags. Patient has a co pay of $17.26 for a box of 10. Patient and wife on fixed income and struggling with the cost. CSW will cover th cost through the patient care fund to assist and reduce financial burden for patient. CSW continues to be available as needed. Raquel Sarna, [place], [place]

## 2020-08-02 NOTE — Telephone Encounter (Signed)
I am more concerned that his symptoms were due to recurrent VT than the Synthroid. Did he have an ICD shock? Can we see if we can get his device interrogated to make sure he was not having VT at the time (he was recently admitted for VT storm)? I am OK if he holds off on Synthroid for now if he really thinks that is what caused his symptoms but he needs to follow-up with his PCP on this as his PCP is the one who needs to be managing the Synthroid and his hypothyroidism.  If he has any recurrent symptoms like this, I think he needs to go to the ED to be evaluated.  Thank you!

## 2020-08-02 NOTE — Telephone Encounter (Signed)
Attempted to contact pt. Unable to leave message as mailbox is not set up.    

## 2020-08-02 NOTE — Telephone Encounter (Signed)
Spoke with pt who state he recently started levothyroxine 25 mg two days ago and believe it's causing a reaction. Pt state 10-15 minutes after taking medication, he experienced chest tightness, stomach pain, diaphoretic, and can't breath. He state the first day symptoms lasted 1/2 day and felt very sluggish. He also report he took 1 nitro. Yesterday symptoms only last 2-3 hours. This morning pt report he didn't take medication and not having any symptoms currently.  Will forward to PA for recommendations.

## 2020-08-15 NOTE — Telephone Encounter (Signed)
Per chart review pt has an appointment with EP on 5/27.

## 2020-08-17 ENCOUNTER — Other Ambulatory Visit: Payer: Self-pay

## 2020-08-17 ENCOUNTER — Encounter: Payer: Self-pay | Admitting: Internal Medicine

## 2020-08-17 ENCOUNTER — Ambulatory Visit: Payer: Medicare Other | Admitting: Internal Medicine

## 2020-08-17 VITALS — BP 124/76 | HR 83 | Ht 72.0 in | Wt 183.2 lb

## 2020-08-17 DIAGNOSIS — E032 Hypothyroidism due to medicaments and other exogenous substances: Secondary | ICD-10-CM

## 2020-08-17 DIAGNOSIS — I472 Ventricular tachycardia, unspecified: Secondary | ICD-10-CM

## 2020-08-17 DIAGNOSIS — Z933 Colostomy status: Secondary | ICD-10-CM

## 2020-08-17 DIAGNOSIS — I240 Acute coronary thrombosis not resulting in myocardial infarction: Secondary | ICD-10-CM | POA: Diagnosis not present

## 2020-08-17 DIAGNOSIS — I4819 Other persistent atrial fibrillation: Secondary | ICD-10-CM | POA: Diagnosis not present

## 2020-08-17 DIAGNOSIS — Z95810 Presence of automatic (implantable) cardiac defibrillator: Secondary | ICD-10-CM

## 2020-08-17 DIAGNOSIS — I259 Chronic ischemic heart disease, unspecified: Secondary | ICD-10-CM

## 2020-08-17 DIAGNOSIS — I5022 Chronic systolic (congestive) heart failure: Secondary | ICD-10-CM | POA: Diagnosis not present

## 2020-08-17 NOTE — Patient Instructions (Addendum)
Medication Instructions: Resume your Metoprolol '25mg'$  - 1 tablet by mouth daily. Refill sent to pharmacy on file.  Your physician recommends that you continue on your current medications as directed. Please refer to the Current Medication list given to you today. *If you need a refill on your cardiac medications before your next appointment, please call your pharmacy*   Lab Work: TSH 08/23/2020  If you have labs (blood work) drawn today and your tests are completely normal, you will receive your results only by: Marland Kitchen MyChart Message (if you have MyChart) OR . A paper copy in the mail If you have any lab test that is abnormal or we need to change your treatment, we will call you to review the results.   Test ing/Procedures: Surgical consult with [facility] Surgery for Colostomy reversal.  Someone will call to schedule appointment    Follow-Up: At [facility], you and your health needs are our priority.  As part of our continuing mission to provide you with exceptional heart care, we have created designated Provider Care Teams.  These Care Teams include your primary Cardiologist (physician) and Advanced Practice Providers (APPs -  Physician Assistants and Nurse Practitioners) who all work together to provide you with the care you need, when you need it.  We recommend signing up for the patient portal called "MyChart".  Sign up information is provided on this After Visit Summary.  MyChart is used to connect with patients for Virtual Visits (Telemedicine).  Patients are able to view lab/test results, encounter notes, upcoming appointments, etc.  Non-urgent messages can be sent to your provider as well.   To learn more about what you can do with MyChart, go to [email or web address].    Your next appointment:   6 month(s)  The format for your next appointment:   In Person  Provider:   Virl Axe, MD   You will also need an appointment with our [place] office in the next 2-3  weeks to follow up on increasing your medication due to your cardiomyopathy and for Amiodarone labs.  Someone will call you to schedule this appointment

## 2020-08-17 NOTE — Progress Notes (Signed)
ELECTROPHYSIOLOGY CONSULT NOTE  Patient ID: Charles Mcdonald, MRN: [id number], DOB/AGE: 1945/07/21 75 y.o. Admit date: (Not on file) Date of Consult: 08/17/2020  Primary Physician: Joline Salt, PA-C Primary Cardiologist: Aj Henshaw is a 75 y.o. male who is being seen today for the evaluation of VT at the request of MCr.    HPI Charles Mcdonald is a 75 y.o. 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  He has a history of ischemic cardiomyopathy dating back to 2002 when he suffered an anterior wall MI.  He was felt not to be a candidate for surgical revascularization" refused transplant "repeat non-STEMI 2017 and there was an unsuccessful attempt at PCI of his LAD.  Initial ICD implanted 2006 for primary prevention.  He underwent generator replacement 4/20 with efforts at that time to implanted LV lead complicated by repeated dislodgment.  His device is a CRT-D with the LV port plugged.  Has a history of persistent atrial fibrillation for which he underwent cavotricuspid isthmus ablation 2/16.  He is anticoagulated with Eliquis.  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  During that hospitalization he had multiple episodes of ventricular tachycardia as well as atrial fibrillation for which he underwent cardioversion at least on 1 occasion.  He was treated with amiodarone and pressors for shock required intubation and short-term PEG feeding.  Echo EF was 25-30% and went to select LTAC.  He required repeated surgical procedures 1/22 and concurrent with this had multiple ICD discharges.  Readmitted to: 06/16/2020 for VT storm apparently had 103 ICD shocks in the ER\with multiple paced terminated events and multiple episodes of monomorphic rapid ventricular tachycardia (cycle length 240 ms) treated with a shock without attempted ATP at rates over 200 bpm.  His potassium at that time was noted to be  2.1.  Following potassium repletion and concurrent amiodarone ventricular tachycardia became quiescient.  Repeat echocardiogram demonstrated EF of 20-25%  Further addendum 08/14/2020 notes from [facility] cardiology guideline directed therapy had been discontinued because of hypotension and the patient was then treated with midodrine.  Interval discontinuation and now on low-dose losartan  As he was remotely not a candidate for revascularization, I presume that is the reason for no interval Myoview scanning  The patient denies chest pain, nocturnal dyspnea, orthopnea   There have been no palpitations, lightheadedness or syncope.   Slightly active during errands ,some yard work. Have some exertional shortness of breath but able to walk to mailbox that is approximately 100-150 ft without dyspnea.    Some bilateral LE edema.   Has a colostomy unaware of plans to take it down  His infection in Genital area has healed.   Non smoker. No  Achohlol use   Patient denies symptoms of GI intolerance, sun sensitivity, neurological symptoms attributable to amiodarone.     DATE TEST EF   1/22 Echo  20-25 %   3/22 Echo  20-25%   4/22 Echo 20-25%    Date Cr K TSH LFTs Hgb  12/21 1.71 3.3   8.8  4/22 2.13 5.5 17.35 15 11.4    Past Medical History:  Diagnosis Date  . Acute on chronic respiratory failure with hypoxia ([place])   . Atrial fibrillation, chronic ([place])   . Benign hypertension   . CAD (coronary artery disease), native coronary artery 06/19/2020  . Chronic HFrEF (heart failure with reduced ejection fraction) ([place])   .  Chronic kidney disease, stage III (moderate) ([facility])   . Diabetes ([place])   . Gout   . Hyperlipidemia       Surgical History:  Past Surgical History:  Procedure Laterality Date  . IR THORACENTESIS ASP PLEURAL SPACE W/IMG GUIDE  04/17/2020  . SKIN SPLIT GRAFT Bilateral 03/27/2020   Procedure: SKIN GRAFT SPLIT THICKNESS;  Surgeon: Cindra Presume, MD;  Location: [place];  Service:  Plastics;  Laterality: Bilateral;  . WOUND EXPLORATION Bilateral 03/27/2020   Procedure: Exploration bilateral groin wounds with partial closure;  Surgeon: Cindra Presume, MD;  Location: [place];  Service: Plastics;  Laterality: Bilateral;     Home Meds: Current Meds  Medication Sig  . allopurinol (ZYLOPRIM) 100 MG tablet Take 100 mg by mouth daily.  Marland Kitchen amiodarone (PACERONE) 200 MG tablet Take 1 tablet (200 mg total) by mouth daily.  Marland Kitchen apixaban (ELIQUIS) 5 MG TABS tablet Take 5 mg by mouth every 12 (twelve) hours.  Marland Kitchen ascorbic acid (VITAMIN C) 500 MG tablet Take 500 mg by mouth daily.  Marland Kitchen aspirin EC 81 MG tablet Take 81 mg by mouth as directed. Swallow whole.  Marland Kitchen atorvastatin (LIPITOR) 40 MG tablet Take 40 mg by mouth at bedtime.  . colchicine 0.6 MG tablet Take 0.6 mg by mouth See admin instructions. Take 0.6 mg by mouth as directed for gout flares  . ergocalciferol (VITAMIN D2) 1.25 MG (50000 UT) capsule Take 50,000 Units by mouth once a week.  Marland Kitchen glucose blood test strip Accu-Chek Aviva Plus test strips  . insulin lispro (HUMALOG) 100 UNIT/ML KwikPen Inject 5 Units into the skin 2 (two) times daily before a meal.  . LANTUS SOLOSTAR 100 UNIT/ML Solostar Pen Inject 5-10 Units into the skin See admin instructions. Inject 10 units into the skin before breakfast and 5 units at bedtime  . levothyroxine (SYNTHROID) 25 MCG tablet Take 1 tablet (25 mcg total) by mouth daily before breakfast.  . losartan (COZAAR) 25 MG tablet Take 0.5 tablets (12.5 mg total) by mouth daily.  . nitroGLYCERIN (NITROSTAT) 0.4 MG SL tablet Place 1 tablet (0.4 mg total) under the tongue every 5 (five) minutes x 3 doses as needed for chest pain.  . pantoprazole (PROTONIX) 40 MG tablet Take 40 mg by mouth at bedtime.  . potassium chloride SA (KLOR-CON) 20 MEQ tablet Take 1 tablet (20 mEq total) by mouth daily.    Allergies: No Known Allergies  Social History   Socioeconomic History  . Marital status: Married    Spouse  name: Dwan Bolt. [place]  . Number of children: 7  . Years of education: Not on file  . Highest education level: Some college, no degree  Occupational History  . Occupation: Retried  Tobacco Use  . Smoking status: Former Smoker    Packs/day: 0.20    Years: 55.00    Pack years: 11.00    Types: Cigarettes    Quit date: 2012    Years since quitting: 10.4  . Smokeless tobacco: Never Used  Vaping Use  . Vaping Use: Never used  Substance and Sexual Activity  . Alcohol use: Never  . Drug use: Never  . Sexual activity: Not Currently  Other Topics Concern  . Not on file  Social History Narrative  . Not on file   Social Determinants of Health   Financial Resource Strain: High Risk  . Difficulty of Paying Living Expenses: Hard  Food Insecurity: Food Insecurity Present  . Worried About [employer] of  Food in the Last Year: Sometimes true  . Ran Out of Food in the Last Year: Sometimes true  Transportation Needs: No Transportation Needs  . Lack of Transportation (Medical): No  . Lack of Transportation (Non-Medical): No  Physical Activity: Not on file  Stress: Not on file  Social Connections: Not on file  Intimate Partner Violence: Not on file     Family History  Problem Relation Age of Onset  . Heart disease Father   . Diabetes Sister      ROS:  Please see the history of present illness. All other systems reviewed and negative.   BP 124/76   Pulse 83   Ht 6' (1.829 m)   Wt 183 lb 3.2 oz (83.1 kg)   SpO2 96%   BMI 24.85 kg/m   Well developed and nourished in no acute distress HENT normal Neck supple   Device pocket well healed; without hematoma or erythema.  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  With potassium repletion and amiodarone his ventricular tachycardia is quiescient.Potassium is normal.  We will continue his amiodarone currently at 200 mg a day.  Initial laboratories in April were notable for significant hypothyroidism.  We will need to recheck his TSH  For his cardiomyopathy, most of his medications were held in March.  He has been resumed on low-dose losartan 12.5 mg.  Metoprolol expired we will resume it at 25 mg daily.  Gradual up titration would be appropriate as he stabilizes.  We will arrange follow-up  Given his very rapid ventricular tachycardia, pace termination is not likely.  However, we will activate ATP during charging.  Moreover, hopefully amiodarone will slow his ventricular tachycardia some and make it more amenable to pacing.  In addition, we have reprogrammed his device to give him a longer detection in his VT 1 zone and then have programmed on fast VT so as to offer more ATP for heart rates between 200 to 40 bpm  His IVCD is broad at 176 ms.  But it is not a left bundle with Q waves in leads I, L, V6 and hence, especially given the difficulties he had before would undertake CRT with some trepidation.  Perhaps echocardiography might be able to determine whether there really is significant dyssynchrony which might be a target (realizing that echo assessment of dyssynchrony is likely )  We will also refer him to  [facility] surgery for consideration of takedown of his colostomy  The time spend this day on the patients care with chart review and directly in consultation with the patient was 83 min.    I,Alexis Bryant,acting as a scribe for Virl Axe, MD.,have documented all relevant documentation on the behalf of Virl Axe, MD,as directed by  Virl Axe, MD while in  the presence of Virl Axe, MD.  I, Virl Axe, MD, have reviewed all documentation for this visit. The documentation on 08/18/20 for the exam, diagnosis, procedures, and orders are all accurate and complete.   Lucille Passy

## 2020-08-21 MED ORDER — METOPROLOL SUCCINATE ER 25 MG PO TB24: 25.0000 mg | ORAL_TABLET | Freq: Every day | ORAL | 3 refills | Status: DC

## 2020-08-21 NOTE — Addendum Note (Signed)
Addended by: Thora Lance on: 08/21/2020 11:14 AM   Modules accepted: Orders

## 2020-08-23 ENCOUNTER — Telehealth: Payer: Self-pay | Admitting: Licensed Clinical Social Worker

## 2020-08-23 ENCOUNTER — Other Ambulatory Visit: Payer: Medicare Other | Admitting: *Deleted

## 2020-08-23 ENCOUNTER — Other Ambulatory Visit: Payer: Self-pay

## 2020-08-23 DIAGNOSIS — I4819 Other persistent atrial fibrillation: Secondary | ICD-10-CM

## 2020-08-23 DIAGNOSIS — E032 Hypothyroidism due to medicaments and other exogenous substances: Secondary | ICD-10-CM

## 2020-08-23 DIAGNOSIS — I472 Ventricular tachycardia, unspecified: Secondary | ICD-10-CM

## 2020-08-23 NOTE — Telephone Encounter (Signed)
Patient's wife called for assistance with obtaining colostomy bags. CSW discussed contacting patient assistance program through [employer] program @ [phone] #6 to discuss support and assistance for the long term needs. CSW contacted [facility] to assist with a supply of bags to bridge the patient until discussion with the assistance program. Patient and wife grateful for the support and assistance. CSW available as needed. Raquel Sarna, [place], [place]

## 2020-08-24 ENCOUNTER — Telehealth: Payer: Self-pay | Admitting: Cardiovascular Disease

## 2020-08-24 ENCOUNTER — Telehealth: Payer: Self-pay

## 2020-08-24 LAB — TSH: TSH: 30 u[IU]/mL — ABNORMAL HIGH (ref 0.450–4.500)

## 2020-08-24 NOTE — Telephone Encounter (Signed)
Spoke with patient regarding the Wednesday 08/29/20 10:00 am appointment with Dr. Sallyanne Kuster (per Dr. Olin Pia request).  Patient voiced his udnerstanding.:

## 2020-08-24 NOTE — Telephone Encounter (Signed)
Pt with elevated TSH.  Per Dr Sallyanne Kuster whom pt will be seeing 08/29/2020 at the request of Dr Caryl Comes please confirm with pt he is taking Levothyroxine 92mg daily and he will address at 08/29/2020 appointment.  Spoke with pt who reports he is taking Levothyroxine 285m daily as well as other medications as prescribed.  Stressed importance of keeping appointment next week with Dr CrSallyanne KusterPt verbalizes understanding and agrees with current plan.

## 2020-08-29 ENCOUNTER — Ambulatory Visit (INDEPENDENT_AMBULATORY_CARE_PROVIDER_SITE_OTHER): Payer: Medicare Other | Admitting: Cardiovascular Disease

## 2020-08-29 ENCOUNTER — Encounter: Payer: Self-pay | Admitting: Cardiovascular Disease

## 2020-08-29 ENCOUNTER — Other Ambulatory Visit: Payer: Self-pay

## 2020-08-29 VITALS — BP 115/66 | HR 83 | Ht 72.0 in | Wt 188.0 lb

## 2020-08-29 DIAGNOSIS — E7800 Pure hypercholesterolemia, unspecified: Secondary | ICD-10-CM

## 2020-08-29 DIAGNOSIS — I2510 Atherosclerotic heart disease of native coronary artery without angina pectoris: Secondary | ICD-10-CM

## 2020-08-29 DIAGNOSIS — Z95810 Presence of automatic (implantable) cardiac defibrillator: Secondary | ICD-10-CM

## 2020-08-29 DIAGNOSIS — I472 Ventricular tachycardia, unspecified: Secondary | ICD-10-CM

## 2020-08-29 DIAGNOSIS — I480 Paroxysmal atrial fibrillation: Secondary | ICD-10-CM

## 2020-08-29 DIAGNOSIS — Z01810 Encounter for preprocedural cardiovascular examination: Secondary | ICD-10-CM

## 2020-08-29 DIAGNOSIS — E039 Hypothyroidism, unspecified: Secondary | ICD-10-CM

## 2020-08-29 DIAGNOSIS — Z7901 Long term (current) use of anticoagulants: Secondary | ICD-10-CM

## 2020-08-29 DIAGNOSIS — N1832 Chronic kidney disease, stage 3b: Secondary | ICD-10-CM

## 2020-08-29 DIAGNOSIS — I5022 Chronic systolic (congestive) heart failure: Secondary | ICD-10-CM

## 2020-08-29 DIAGNOSIS — Z5181 Encounter for therapeutic drug level monitoring: Secondary | ICD-10-CM

## 2020-08-29 DIAGNOSIS — E118 Type 2 diabetes mellitus with unspecified complications: Secondary | ICD-10-CM

## 2020-08-29 DIAGNOSIS — Z79899 Other long term (current) drug therapy: Secondary | ICD-10-CM

## 2020-08-29 LAB — PACEMAKER DEVICE OBSERVATION

## 2020-08-29 MED ORDER — LOSARTAN POTASSIUM 25 MG PO TABS
25.0000 mg | ORAL_TABLET | Freq: Every day | ORAL | 5 refills | Status: DC
Start: 2020-08-29 — End: 2020-11-05

## 2020-08-29 NOTE — Progress Notes (Signed)
Cardiology Office Note:    Date:  08/29/2020   ID:  Charles Mcdonald, DOB 25-Apr-1945, MRN [id number]  PCP:  Charles Salt, PA-C   [facility] Providers Cardiologist:  Charles Klein, MD     Referring MD: Charles Salt, PA-C   Chief Complaint  Patient presents with  . Congestive Heart Failure    History of Present Illness:    Charles Mcdonald is a 75 y.o. 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  He had a protracted illness with septic shock in November 2021 during which his defibrillator delivered several shocks.  He required reconstructive surgery in January and had more shocks from his defibrillator around that time.  He was discharged from long-term acute care earlier this year and presented with ventricular tachycardia storm and more than 100 consecutive defibrillator shocks in the setting of severe hypokalemia.  He was subsequently hospitalized again for acute heart failure exacerbation when his diuretics were discontinued, .  He is finally achieved some degree of stability.  Activity level is improving and he walked into the office today using only a cane.  He does not have edema, orthopnea or PND and complains very little of exertional dyspnea.  He has not had any angina pectoris.  He denies dizziness, palpitations or syncope.  He has not received  any further defibrillator shocks.  His weight seems to be steady at around 183 pounds on his home scale, about 3-5 pounds higher at the office.  He has not had any falls, injuries or bleeding problems.  He is compliant with Eliquis anticoagulation.  Defibrillator interrogation shows normal device function.  His initial device and right ventricular lead were implanted in 2006.  He has a [employer] device implanted in April 2020, which is a CRT-D, but his left ventricular port is pinned.  Attempts at placing a left ventricular lead were unsuccessful due to recurrent lead dislodgment.  There was a plan for future use of an active-fixation lead or epicardial lead.  Estimated generator longevity 17 months, greatly abbreviated by the multiple ICD shocks.  He has 93% atrial pacing with a good heart rate histogram distribution.  He does not require ventricular pacing.  There have been no episodes of VT either sustained or nonsustained and no atrial fibrillation over the last few weeks.  OptiVol level remains in the hypervolemia range but seems to be very slowly improving.  All lead parameters are normal.  He saw Dr. Caryl Mcdonald on 08/17/2020.  He commented that he is probably not a great CRT-D candidate since his IVCD is not a typical left bundle branch block. His VT in March 2022 was monomorphic with a cycle length around 240 ms and generally responded to ATP, but also led to multiple shocks. ATP during charging was turned on, although at the active cycle length conventional ATP would not be operational.  Hopefully with amiodarone therapy the VT cycle length will lengthen and ATP will be more frequently successful.  He  had normal liver function tests in late April.  His TSH was severely elevated at 30 on 08/23/2020.  He is only taking his levothyroxine intermittently since taking it on empty stomach in the morning makes him nauseous.  Hemoglobin A1c is good at 6.4% and his most recent LDL cholesterol was close  to target at 75, although his HDL was low at 28.  Due to low blood pressure, for a while he received midodrine.  This has been discontinued.  He is taking minimal doses of heart failure medications: Metoprolol succinate 25 mg once daily and losartan 12.5 mg once daily.  He is also on potassium chloride 20 mill colons daily and furosemide 40 mg daily.  He is considering takedown of his colostomy and is referred to [facility] surgery.  Past Medical History:  Diagnosis Date  . Acute on chronic respiratory failure with hypoxia ([place])   . Atrial fibrillation, chronic ([place])   . Benign hypertension   . CAD (coronary artery disease), native coronary artery 06/19/2020  . Chronic HFrEF (heart failure with reduced ejection fraction) ([place])   . Chronic kidney disease, stage III (moderate) (HCC)   . Diabetes ([place])   . Gout   . Hyperlipidemia     Past Surgical History:  Procedure Laterality Date  . IR THORACENTESIS ASP PLEURAL SPACE W/IMG GUIDE  04/17/2020  . SKIN SPLIT GRAFT Bilateral 03/27/2020   Procedure: SKIN GRAFT SPLIT THICKNESS;  Surgeon: Charles Presume, MD;  Location: [place];  Service: Plastics;  Laterality: Bilateral;  . WOUND EXPLORATION Bilateral 03/27/2020   Procedure: Exploration bilateral groin wounds with partial closure;  Surgeon: Charles Presume, MD;  Location: [place];  Service: Plastics;  Laterality: Bilateral;    Current Medications: Current Meds  Medication Sig  . allopurinol (ZYLOPRIM) 100 MG tablet Take 100 mg by mouth daily.  Marland Kitchen amiodarone (PACERONE) 200 MG tablet Take 1 tablet (200 mg total) by mouth daily.  Marland Kitchen apixaban (ELIQUIS) 5 MG TABS tablet Take 5 mg by mouth every 12 (twelve) hours.  Marland Kitchen ascorbic acid (VITAMIN C) 500 MG tablet Take 500 mg by mouth daily.  Marland Kitchen atorvastatin (LIPITOR) 40 MG tablet Take 40 mg by mouth at bedtime.  . colchicine 0.6 MG tablet Take 0.6 mg by mouth See admin instructions. Take 0.6 mg by mouth as directed for gout flares  . ergocalciferol  (VITAMIN D2) 1.25 MG (50000 UT) capsule Take 50,000 Units by mouth once a week.  Marland Kitchen glucose blood test strip Accu-Chek Aviva Plus test strips  . insulin lispro (HUMALOG) 100 UNIT/ML KwikPen Inject 5 Units into the skin 2 (two) times daily before a meal.  . LANTUS SOLOSTAR 100 UNIT/ML Solostar Pen Inject 5-10 Units into the skin See admin instructions. Inject 10 units into the skin before breakfast and 5 units at bedtime  . levothyroxine (SYNTHROID) 25 MCG tablet Take 1 tablet (25 mcg total) by mouth daily before breakfast.  . metoprolol succinate (TOPROL-XL) 25 MG 24 hr tablet Take 1 tablet (25 mg total) by mouth daily.  . nitroGLYCERIN (NITROSTAT) 0.4 MG SL tablet Place 1 tablet (0.4 mg total) under the tongue every 5 (five) minutes x 3 doses as needed for chest pain.  . pantoprazole (PROTONIX) 40 MG tablet Take 40 mg by mouth at bedtime.  . potassium chloride SA (KLOR-CON) 20 MEQ tablet Take 1 tablet (20 mEq total) by mouth daily.  . [DISCONTINUED] aspirin EC 81 MG tablet Take 81 mg by mouth as directed. Swallow whole.  . [  DISCONTINUED] losartan (COZAAR) 25 MG tablet Take 0.5 tablets (12.5 mg total) by mouth daily.     Allergies:   Patient has no known allergies.   Social History   Socioeconomic History  . Marital status: Married    Spouse name: Dwan Bolt. Guedes  . Number of children: 7  . Years of education: Not on file  . Highest education level: Some college, no degree  Occupational History  . Occupation: Retried  Tobacco Use  . Smoking status: Former Smoker    Packs/day: 0.20    Years: 55.00    Pack years: 11.00    Types: Cigarettes    Quit date: 2012    Years since quitting: 10.4  . Smokeless tobacco: Never Used  Vaping Use  . Vaping Use: Never used  Substance and Sexual Activity  . Alcohol use: Never  . Drug use: Never  . Sexual activity: Not Currently  Other Topics Concern  . Not on file  Social History Narrative  . Not on file   Social Determinants of Health    Financial Resource Strain: High Risk  . Difficulty of Paying Living Expenses: Hard  Food Insecurity: Food Insecurity Present  . Worried About Charity fundraiser in the Last Year: Sometimes true  . Ran Out of Food in the Last Year: Sometimes true  Transportation Needs: No Transportation Needs  . Lack of Transportation (Medical): No  . Lack of Transportation (Non-Medical): No  Physical Activity: Not on file  Stress: Not on file  Social Connections: Not on file     Family History: The patient's family history includes Diabetes in his sister; Heart disease in his father.  ROS:   Please see the history of present illness.     All other systems reviewed and are negative.  EKGs/Labs/Other Studies Reviewed:    The following studies were reviewed today: Echocardiogram 07/09/2020  Study Result     1. Left ventricular ejection fraction, by estimation, is 20 to 25%. The  left ventricle has severely decreased function. The left ventricle  demonstrates regional wall motion abnormalities.      There is akinesis of the entire anteroseptal, anterior LV walls and  apex. There is severe hypokinesis of all inferoseptal and inferior LV  walls. The inferolateral wall is moderately hypokinetic. The left  ventricular internal cavity size was  moderately dilated. Left ventricular diastolic parameters are consistent  with Grade II diastolic dysfunction (pseudonormalization).  2. Right ventricular systolic function is mildly reduced. The right  ventricular size is normal. There is moderately elevated pulmonary artery  systolic pressure. The estimated right ventricular systolic pressure is  123456 mmHg.  3. Left atrial size was moderately dilated.  4. The mitral valve is normal in structure. Trivial mitral valve  regurgitation. No evidence of mitral stenosis.  5. The aortic valve is tricuspid. Aortic valve regurgitation is not  visualized. No aortic stenosis is present.  6. The  inferior vena cava is normal in size with <50% respiratory  variability, suggesting right atrial pressure of 8 mmHg.     EKG:  EKG is not ordered today.  The ekg ordered 08/17/2020 shows sinus rhythm with a very broad IVCD at 176 ms, but not a typical LBBB with a very sharp initial intrinsicoid deflection in V1-V2.  QTc 509 ms  Recent Labs: 07/08/2020: B Natriuretic Peptide 2,369.9 07/09/2020: Hemoglobin 11.4; Platelets 339 07/12/2020: ALT 15; Magnesium 1.9 07/23/2020: BUN 30; Creatinine, Ser 2.10; Potassium 4.1; Sodium 139 08/23/2020: TSH 30.000  Recent  Lipid Panel    Component Value Date/Time   CHOL 123 06/17/2020 0123   TRIG 99 06/17/2020 0123   HDL 28 (L) 06/17/2020 0123   CHOLHDL 4.4 06/17/2020 0123   VLDL 20 06/17/2020 0123   LDLCALC 75 06/17/2020 0123     Risk Assessment/Calculations:    CHA2DS2-VASc Score = 5  This indicates a 7.2% annual risk of stroke. The patient's score is based upon: CHF History: Yes HTN History: Yes Diabetes History: Yes Stroke History: No Vascular Disease History: Yes Age Score: 1 Gender Score: 0      Physical Exam:    VS:  BP 115/66   Pulse 83   Ht 6' (1.829 m)   Wt 188 lb (85.3 kg)   SpO2 95%   BMI 25.50 kg/m     Wt Readings from Last 3 Encounters:  08/29/20 188 lb (85.3 kg)  08/17/20 183 lb 3.2 oz (83.1 kg)  07/26/20 188 lb 9.6 oz (85.5 kg)     GEN: Appears well, walking steadily with a cane well nourished, well developed in no acute distress HEENT: Normal NECK: No JVD; No carotid bruits LYMPHATICS: No lymphadenopathy CARDIAC: RRR, split second heart sound, no murmurs, rubs, gallops RESPIRATORY:  Clear to auscultation without rales, wheezing or rhonchi  ABDOMEN: Soft, non-tender, non-distended; colostomy MUSCULOSKELETAL:  No edema; No deformity  SKIN: Warm and dry NEUROLOGIC:  Alert and oriented x 3 PSYCHIATRIC:  Normal affect   ASSESSMENT:    1. Chronic HFrEF (heart failure with reduced ejection fraction) ([place])   2.  Coronary artery disease involving native coronary artery of native heart without angina pectoris   3. ICD (implantable cardioverter-defibrillator) in place   4. Ventricular tachycardia (HCC)   5. Paroxysmal atrial fibrillation ([place])   6. Encounter for monitoring amiodarone therapy   7. Long term (current) use of anticoagulants   8. Stage 3b chronic kidney disease ([place])   9. Type 2 diabetes mellitus with complication, without long-term current use of insulin ([place])   10. Acquired hypothyroidism   11. Hypercholesterolemia   12. Preoperative cardiovascular examination    PLAN:    In order of problems listed above:  1. CHF: Clinically euvolemic, although his OptiVol still suggests he is out of range.  No rush to perform more aggressive diuresis.  We will let him gradually reequilibrated while we try to titrate up his heart failure medications.  We will increase his losartan to 25 mg once daily and his next appointment try to titrate up further.  If he can tolerate losartan 50 mg once daily, we can then consider transitioning to [facility].  Continue metoprolol.  Discussed daily weights and spent a long time discussing sodium dietary restriction.  They appeared surprised when I told him about the high sodium content of common meats from the deli or canned foods.  We will recheck renal function and potassium levels at his next appointment, when we will also recheck liver function tests and his TSH. 2. CAD: He has not had angina pectoris or evidence of acute coronary insufficiency during their recent hospitalizations.  Previously told that he was not a candidate for surgical revascularization and had a failed attempt at PCI to the LAD in 2017. 3. ICD: Although he has a CRT-D device in place, the LV port is not occupied by a lead.  Dr. Caryl Mcdonald does not think that he would necessarily improve with CRT-D since he has ischemic cardiomyopathy and does not have a typical LBBB. 4. VT: Quiescent after correction  of  electrolyte imbalances and treatment with amiodarone. 5. PAFib: Has previously had a cavotricuspid isthmus ablation.  Anticoagulation with Eliquis.  No history of stroke/TIA.  Had problems with paroxysmal atrial fibrillation during his admission with sepsis in November, but not a lot of issues since then. 6. Amiodarone: Recheck LFTs and TSH regularly.  He has not been taking his levothyroxine consistently.  Asked him to make sure he is taking it daily and we will recheck labs at his next appointment in roughly a month.  Recommended yearly eye exams and avoidance of excessive sun exposure, he should promptly report any unexpected/unexplained respiratory complaints. 7. Anticoagulation: Well-tolerated without bleeding complications.  His renal function is abnormal but he has normal body size and is less than 80.  Continue the current dose of Eliquis at 5 mg twice daily. No recent arterial events, stop ASA to reduce bleeding risk. 8. CKD 3B: Need to frequently monitor renal function as well.  We will check these at his next appointment.  Baseline creatinine seems to be around 1.8, most recently 2.0-2.1, corresponding to a GFR right around 30. 9. DM: Pretty good control especially considering the seriousness of his recent medical problems.  Should not receive SGLT2 inhibitors due to his history of Fournier's gangrene. 10. Hypothyroidism: He has been avoiding taking the levothyroxine since it causes nausea when he takes it on an empty stomach first thing in the morning.  We will try something a little different.  He should take it at least 3 hours after breakfast every day, but should take it every single day. 11. HLP: LDL cholesterol is not quite at target at 75.  We will recheck at when he Mcdonald in for labs in about a month. 12. Preop CV exam: He is considering a colostomy takedown.  Considering the complexity of his previous perineal surgery, this may not be an easy surgery and he has been through a lot of  cardiovascular complications recently.  I would advise waiting for at least 6 months of stability from his most recent acute event, that is through October 2022, until he undergoes a major surgical procedure.  He will not require "bridging" anticoagulation, but it is important that he be fully compensated and euvolemic at the time of the procedure.        Medication Adjustments/Labs and Tests Ordered: Current medicines are reviewed at length with the patient today.  Concerns regarding medicines are outlined above.  No orders of the defined types were placed in this encounter.  Meds ordered this encounter  Medications  . losartan (COZAAR) 25 MG tablet    Sig: Take 1 tablet (25 mg total) by mouth daily.    Dispense:  30 tablet    Refill:  5    Patient Instructions  Medication Instructions:  INCREASE the Losartan to 25 mg once daily  TAKE the Levothyroxine 3 hours after your breakfast  STOP the Aspirin  *If you need a refill on your cardiac medications before your next appointment, please call your pharmacy*   Lab Work: None ordered If you have labs (blood work) drawn today and your tests are completely normal, you will receive your results only by: Marland Kitchen MyChart Message (if you have MyChart) OR . A paper copy in the mail If you have any lab test that is abnormal or we need to change your treatment, we will call you to review the results.   Testing/Procedures: None ordered   Follow-Up: At [facility] you and your health needs  are our priority.  As part of our continuing mission to provide you with exceptional heart care, we have created designated Provider Care Teams.  These Care Teams include your primary Cardiologist (physician) and Advanced Practice Providers (APPs -  Physician Assistants and Nurse Practitioners) who all work together to provide you with the care you need, when you need it.  We recommend signing up for the patient portal called "MyChart".  Sign up  information is provided on this After Visit Summary.  MyChart is used to connect with patients for Virtual Visits (Telemedicine).  Patients are able to view lab/test results, encounter notes, upcoming appointments, etc.  Non-urgent messages can be sent to your provider as well.   To learn more about what you can do with MyChart, go to [email or web address].    Your next appointment:   Follow up first available with an APP Follow up in 4 months with Dr. Sallyanne Kuster on a device day    Signed, Charles Klein, MD  08/29/2020 11:33 AM    [place]

## 2020-08-29 NOTE — Patient Instructions (Signed)
Medication Instructions:  INCREASE the Losartan to 25 mg once daily  TAKE the Levothyroxine 3 hours after your breakfast  STOP the Aspirin  *If you need a refill on your cardiac medications before your next appointment, please call your pharmacy*   Lab Work: None ordered If you have labs (blood work) drawn today and your tests are completely normal, you will receive your results only by: Marland Kitchen MyChart Message (if you have MyChart) OR . A paper copy in the mail If you have any lab test that is abnormal or we need to change your treatment, we will call you to review the results.   Testing/Procedures: None ordered   Follow-Up: At [facility], you and your health needs are our priority.  As part of our continuing mission to provide you with exceptional heart care, we have created designated Provider Care Teams.  These Care Teams include your primary Cardiologist (physician) and Advanced Practice Providers (APPs -  Physician Assistants and Nurse Practitioners) who all work together to provide you with the care you need, when you need it.  We recommend signing up for the patient portal called "MyChart".  Sign up information is provided on this After Visit Summary.  MyChart is used to connect with patients for Virtual Visits (Telemedicine).  Patients are able to view lab/test results, encounter notes, upcoming appointments, etc.  Non-urgent messages can be sent to your provider as well.   To learn more about what you can do with MyChart, go to [email or web address].    Your next appointment:   Follow up first available with an APP Follow up in 4 months with Dr. Sallyanne Kuster on a device day

## 2020-09-03 ENCOUNTER — Telehealth: Payer: Self-pay | Admitting: Licensed Clinical Social Worker

## 2020-09-03 NOTE — Telephone Encounter (Signed)
Patient's wife contacted CSW to inform that she was successful in obtaining free colostomy bags through [facility]. She stated that they would need added education as the bags are different from the current bags. CSW referred patient to GI MD office. CSW available as needed. Raquel Sarna, [place], [place]

## 2020-09-04 ENCOUNTER — Ambulatory Visit (HOSPITAL_COMMUNITY)
Admission: RE | Admit: 2020-09-04 | Discharge: 2020-09-04 | Disposition: A | Discharge status: Home / Self Care | Payer: Medicare Other | Source: Ambulatory Visit | Attending: Nurse Practitioner | Admitting: Nurse Practitioner

## 2020-09-04 ENCOUNTER — Other Ambulatory Visit: Payer: Self-pay

## 2020-09-04 DIAGNOSIS — K9409 Other complications of colostomy: Secondary | ICD-10-CM | POA: Diagnosis not present

## 2020-09-04 DIAGNOSIS — Z933 Colostomy status: Secondary | ICD-10-CM | POA: Diagnosis present

## 2020-09-04 DIAGNOSIS — Z433 Encounter for attention to colostomy: Secondary | ICD-10-CM | POA: Diagnosis not present

## 2020-09-04 NOTE — Discharge Instructions (Signed)
[facility] to send 20- 2 3/4" filtered pouches to accompany 2 3/4" convex barriers Please start wearing belt again to improve wear time.

## 2020-09-04 NOTE — Progress Notes (Signed)
[facility]   Reason for visit:  Ongoing ostomy teaching.  Bags not working for them.  HPI:  Patient with LLQ colostomy.  WIfe changes pouch and states she just can't figure out his new pouches.  Upon inspection, he has been mailed 2 1/4" closed end pouches and 2 3/4" convex barriers from [facility]. Call is placed to [facility] to amend the order and gain further instruction on return/exchange ROS  Review of Systems  Gastrointestinal:        LLQ colostomy Left flank feeding tube, not currently being used.   All other systems reviewed and are negative. Vital signs:  BP 116/63   Pulse 77   Temp 97.7 F (36.5 C) (Oral)   Resp 20   SpO2 97%  Exam:  Physical Exam Abdominal:      Stoma type/location:  LLQ flush colostomy pink patent and producing soft brown stool Stomal assessment/size:  1 1/2" os points upwards towards 12 o'clock Peristomal assessment:  Skin intact, abdomen rounded Treatment options for stomal/peristomal skin: convex barrier and 2 piece pouch with filter.   Output: soft brown stool Ostomy pouching: 2pc. 2 3/4 convex pouch.  Agrees to begin wearing belt again. Due to flush stoma, this will improve wear time.   Education provided:  [facility] indicated they had sent 2 1/4" closed end pouches with 2 3/4" barriers.  They are sending 20 2 3/4" pouches and will call patient to discuss returning closed end pouches.  Patient and wife are thankful for the assistance.     Impression/dx  Colostomy complication Discussion  Knowledge deficit on ostomy care Plan  Correct pouches ordered. I have sent them home with 3 2 3/4" pouches to accompany the convex barriers they have.     Visit time: 70 minutes.   Domenic Moras FNP-BC

## 2020-10-18 ENCOUNTER — Other Ambulatory Visit: Payer: Self-pay | Admitting: Cardiovascular Disease

## 2020-10-19 ENCOUNTER — Encounter (HOSPITAL_COMMUNITY): Payer: Self-pay | Admitting: Anesthesiology

## 2020-10-19 ENCOUNTER — Other Ambulatory Visit (HOSPITAL_COMMUNITY): Payer: Self-pay

## 2020-10-19 ENCOUNTER — Encounter (HOSPITAL_COMMUNITY): Payer: Self-pay | Admitting: Internal Medicine

## 2020-10-19 ENCOUNTER — Other Ambulatory Visit: Payer: Self-pay

## 2020-10-19 ENCOUNTER — Encounter (HOSPITAL_COMMUNITY)
Admission: EM | Disposition: A | Discharge status: Hospice | Payer: Self-pay | Source: Home / Self Care | Attending: Internal Medicine

## 2020-10-19 ENCOUNTER — Inpatient Hospital Stay (HOSPITAL_COMMUNITY)
Admission: EM | Admit: 2020-10-19 | Discharge: 2020-10-19 | DRG: 389 | Disposition: A | Discharge status: Home / Self Care | Payer: Medicare Other | Attending: Internal Medicine | Admitting: Internal Medicine

## 2020-10-19 ENCOUNTER — Emergency Department (HOSPITAL_COMMUNITY): Payer: Medicare Other

## 2020-10-19 DIAGNOSIS — I472 Ventricular tachycardia: Secondary | ICD-10-CM | POA: Diagnosis present

## 2020-10-19 DIAGNOSIS — K562 Volvulus: Secondary | ICD-10-CM | POA: Diagnosis present

## 2020-10-19 DIAGNOSIS — Z7901 Long term (current) use of anticoagulants: Secondary | ICD-10-CM | POA: Diagnosis not present

## 2020-10-19 DIAGNOSIS — Z79899 Other long term (current) drug therapy: Secondary | ICD-10-CM

## 2020-10-19 DIAGNOSIS — Z833 Family history of diabetes mellitus: Secondary | ICD-10-CM

## 2020-10-19 DIAGNOSIS — I255 Ischemic cardiomyopathy: Secondary | ICD-10-CM | POA: Diagnosis present

## 2020-10-19 DIAGNOSIS — R109 Unspecified abdominal pain: Secondary | ICD-10-CM | POA: Diagnosis present

## 2020-10-19 DIAGNOSIS — I2510 Atherosclerotic heart disease of native coronary artery without angina pectoris: Secondary | ICD-10-CM | POA: Diagnosis present

## 2020-10-19 DIAGNOSIS — N183 Chronic kidney disease, stage 3 unspecified: Secondary | ICD-10-CM | POA: Diagnosis present

## 2020-10-19 DIAGNOSIS — Z20822 Contact with and (suspected) exposure to covid-19: Secondary | ICD-10-CM | POA: Diagnosis present

## 2020-10-19 DIAGNOSIS — I5042 Chronic combined systolic (congestive) and diastolic (congestive) heart failure: Secondary | ICD-10-CM | POA: Diagnosis present

## 2020-10-19 DIAGNOSIS — E1122 Type 2 diabetes mellitus with diabetic chronic kidney disease: Secondary | ICD-10-CM | POA: Diagnosis present

## 2020-10-19 DIAGNOSIS — M109 Gout, unspecified: Secondary | ICD-10-CM | POA: Diagnosis present

## 2020-10-19 DIAGNOSIS — I5022 Chronic systolic (congestive) heart failure: Secondary | ICD-10-CM | POA: Diagnosis present

## 2020-10-19 DIAGNOSIS — I482 Chronic atrial fibrillation, unspecified: Secondary | ICD-10-CM | POA: Diagnosis present

## 2020-10-19 DIAGNOSIS — Z79890 Hormone replacement therapy (postmenopausal): Secondary | ICD-10-CM | POA: Diagnosis not present

## 2020-10-19 DIAGNOSIS — Z933 Colostomy status: Secondary | ICD-10-CM

## 2020-10-19 DIAGNOSIS — I130 Hypertensive heart and chronic kidney disease with heart failure and stage 1 through stage 4 chronic kidney disease, or unspecified chronic kidney disease: Secondary | ICD-10-CM | POA: Diagnosis present

## 2020-10-19 DIAGNOSIS — E785 Hyperlipidemia, unspecified: Secondary | ICD-10-CM | POA: Diagnosis present

## 2020-10-19 DIAGNOSIS — Z95810 Presence of automatic (implantable) cardiac defibrillator: Secondary | ICD-10-CM | POA: Diagnosis present

## 2020-10-19 DIAGNOSIS — Z8249 Family history of ischemic heart disease and other diseases of the circulatory system: Secondary | ICD-10-CM | POA: Diagnosis not present

## 2020-10-19 DIAGNOSIS — K9423 Gastrostomy malfunction: Secondary | ICD-10-CM | POA: Diagnosis present

## 2020-10-19 DIAGNOSIS — Z87891 Personal history of nicotine dependence: Secondary | ICD-10-CM

## 2020-10-19 DIAGNOSIS — Z794 Long term (current) use of insulin: Secondary | ICD-10-CM | POA: Diagnosis not present

## 2020-10-19 LAB — I-STAT CHEM 8, ED
BUN: 24 mg/dL — ABNORMAL HIGH (ref 8–23)
Calcium, Ion: 0.97 mmol/L — ABNORMAL LOW (ref 1.15–1.40)
Chloride: 109 mmol/L (ref 98–111)
Creatinine, Ser: 2 mg/dL — ABNORMAL HIGH (ref 0.61–1.24)
Glucose, Bld: 122 mg/dL — ABNORMAL HIGH (ref 70–99)
HCT: 44 % (ref 39.0–52.0)
Hemoglobin: 15 g/dL (ref 13.0–17.0)
Potassium: 4.1 mmol/L (ref 3.5–5.1)
Sodium: 137 mmol/L (ref 135–145)
TCO2: 21 mmol/L — ABNORMAL LOW (ref 22–32)

## 2020-10-19 LAB — CBC WITH DIFFERENTIAL/PLATELET
Abs Immature Granulocytes: 0.02 10*3/uL (ref 0.00–0.07)
Basophils Absolute: 0 10*3/uL (ref 0.0–0.1)
Basophils Relative: 0 %
Eosinophils Absolute: 0 10*3/uL (ref 0.0–0.5)
Eosinophils Relative: 0 %
HCT: 38.1 % — ABNORMAL LOW (ref 39.0–52.0)
Hemoglobin: 12.5 g/dL — ABNORMAL LOW (ref 13.0–17.0)
Immature Granulocytes: 0 %
Lymphocytes Relative: 9 %
Lymphs Abs: 0.8 10*3/uL (ref 0.7–4.0)
MCH: 27.7 pg (ref 26.0–34.0)
MCHC: 32.8 g/dL (ref 30.0–36.0)
MCV: 84.3 fL (ref 80.0–100.0)
Monocytes Absolute: 0.7 10*3/uL (ref 0.1–1.0)
Monocytes Relative: 7 %
Neutro Abs: 7.4 10*3/uL (ref 1.7–7.7)
Neutrophils Relative %: 84 %
Platelets: 254 10*3/uL (ref 150–400)
RBC: 4.52 MIL/uL (ref 4.22–5.81)
RDW: 19 % — ABNORMAL HIGH (ref 11.5–15.5)
WBC: 8.9 10*3/uL (ref 4.0–10.5)
nRBC: 0 % (ref 0.0–0.2)

## 2020-10-19 LAB — CBG MONITORING, ED
Glucose-Capillary: 110 mg/dL — ABNORMAL HIGH (ref 70–99)
Glucose-Capillary: 127 mg/dL — ABNORMAL HIGH (ref 70–99)

## 2020-10-19 LAB — RESP PANEL BY RT-PCR (FLU A&B, COVID) ARPGX2
Influenza A by PCR: NEGATIVE
Influenza B by PCR: NEGATIVE
SARS Coronavirus 2 by RT PCR: NEGATIVE

## 2020-10-19 SURGERY — LAPAROTOMY, EXPLORATORY
Anesthesia: General

## 2020-10-19 MED ORDER — ONDANSETRON HCL 4 MG/2ML IJ SOLN
4.0000 mg | Freq: Four times a day (QID) | INTRAMUSCULAR | Status: DC | PRN
Start: 2020-10-19 — End: 2020-10-19

## 2020-10-19 MED ORDER — MORPHINE SULFATE (PF) 2 MG/ML IV SOLN
2.0000 mg | INTRAVENOUS | Status: DC | PRN
Start: 2020-10-19 — End: 2020-10-19
  Administered 2020-10-19 (×2): 2 mg via INTRAVENOUS
  Filled 2020-10-19 (×2): qty 1

## 2020-10-19 MED ORDER — PROPOFOL 10 MG/ML IV BOLUS
INTRAVENOUS | Status: AC
  Filled 2020-10-19: qty 20

## 2020-10-19 MED ORDER — MORPHINE SULFATE (PF) 2 MG/ML IV SOLN
2.0000 mg | Freq: Once | INTRAVENOUS | Status: AC
  Administered 2020-10-19: 2 mg via INTRAVENOUS
  Filled 2020-10-19: qty 1

## 2020-10-19 MED ORDER — PIPERACILLIN-TAZOBACTAM 3.375 G IVPB 30 MIN
3.3750 g | Freq: Once | INTRAVENOUS | Status: AC
  Administered 2020-10-19: 3.375 g via INTRAVENOUS
  Filled 2020-10-19: qty 50

## 2020-10-19 MED ORDER — SODIUM CHLORIDE 0.9 % IV BOLUS
500.0000 mL | Freq: Once | INTRAVENOUS | Status: AC
  Administered 2020-10-19: 500 mL via INTRAVENOUS

## 2020-10-19 MED ORDER — ONDANSETRON HCL 4 MG/2ML IJ SOLN
4.0000 mg | Freq: Once | INTRAMUSCULAR | Status: AC
  Administered 2020-10-19: 4 mg via INTRAVENOUS
  Filled 2020-10-19: qty 2

## 2020-10-19 MED ORDER — FENTANYL CITRATE (PF) 250 MCG/5ML IJ SOLN
INTRAMUSCULAR | Status: AC
  Filled 2020-10-19: qty 5

## 2020-10-19 MED ORDER — HYDROCODONE-ACETAMINOPHEN 5-325 MG PO TABS
1.0000 | ORAL_TABLET | Freq: Four times a day (QID) | ORAL | 0 refills | Status: AC | PRN
  Filled 2020-10-19: qty 20, 5d supply, fill #0

## 2020-10-19 NOTE — H&P (Signed)
Reason for Consult/Chief Complaint: cecal volvulus Consultant: Tyrone Nine, DO  Charles Mcdonald is an 75 y.o. male.   HPI: 4M presents with a two day history of abdominal pain. Denies n/v. Reports last normal ostomy o/p 7/28 late PM but having a small amount of ostomy o/p currently. Unknown last colonoscopy. 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 He is unable to provide any additional information regarding this surgery, including who performed it. He never followed up with the surgeon. He had a PEG tube placed and went to rehab post-admission from what I can glean from the chart. He reports having never changed the dressing around the PEG tube since it was placed, just reinforced by his wife. He is no longer using the PEG tube. His wife and son are at bedside and are unable to meaningfully contribute to the history.   Past Medical History:  Diagnosis Date   Acute on chronic respiratory failure with hypoxia (HCC)    Atrial fibrillation, chronic (HCC)    Benign hypertension    CAD (coronary artery disease), native coronary artery 06/19/2020   Chronic HFrEF (heart failure with reduced ejection fraction) (HCC)    Chronic kidney disease, stage III (moderate) (HCC)    Diabetes ([place])    Gout    Hyperlipidemia     Past Surgical History:  Procedure Laterality Date   IR THORACENTESIS ASP PLEURAL SPACE W/IMG GUIDE  04/17/2020   SKIN SPLIT GRAFT Bilateral 03/27/2020   Procedure: SKIN GRAFT SPLIT THICKNESS;  Surgeon: Cindra Presume, MD;  Location: [place];  Service: Plastics;  Laterality: Bilateral;   WOUND EXPLORATION Bilateral 03/27/2020   Procedure: Exploration bilateral groin wounds with partial closure;  Surgeon: Cindra Presume, MD;  Location: [place];  Service: Plastics;  Laterality: Bilateral;    Family History  Problem Relation Age of Onset   Heart disease Father    Diabetes Sister     Social History:  reports that he quit smoking about 10  years ago. His smoking use included cigarettes. He has a 11.00 pack-year smoking history. He has never used smokeless tobacco. He reports that he does not drink alcohol and does not use drugs.  Allergies: No Known Allergies  Medications: I have reviewed the patient's current medications.  Results for orders placed or performed during the hospital encounter of 10/19/20 (from the past 48 hour(s))  CBC with Differential     Status: Abnormal   Collection Time: 10/19/20  4:12 AM  Result Value Ref Range   WBC 8.9 4.0 - 10.5 K/uL   RBC 4.52 4.22 - 5.81 MIL/uL   Hemoglobin 12.5 (L) 13.0 - 17.0 g/dL   HCT 38.1 (L) 39.0 - 52.0 %   MCV 84.3 80.0 - 100.0 fL   MCH 27.7 26.0 - 34.0 pg   MCHC 32.8 30.0 - 36.0 g/dL   RDW 19.0 (H) 11.5 - 15.5 %   Platelets 254 150 - 400 K/uL   nRBC 0.0 0.0 - 0.2 %   Neutrophils Relative % 84 %   Neutro Abs 7.4 1.7 - 7.7 K/uL   Lymphocytes Relative 9 %   Lymphs Abs 0.8 0.7 - 4.0 K/uL   Monocytes Relative 7 %   Monocytes Absolute 0.7 0.1 - 1.0 K/uL   Eosinophils Relative 0 %   Eosinophils Absolute 0.0 0.0 - 0.5 K/uL   Basophils Relative 0 %   Basophils Absolute 0.0 0.0 - 0.1 K/uL   Immature  Granulocytes 0 %   Abs Immature Granulocytes 0.02 0.00 - 0.07 K/uL    Comment: Performed at [facility], [place] [address], [place], [place] [address]  I-stat chem 8, ED (not at [facility] or [facility])     Status: Abnormal   Collection Time: 10/19/20  5:47 AM  Result Value Ref Range   Sodium 137 135 - 145 mmol/L   Potassium 4.1 3.5 - 5.1 mmol/L   Chloride 109 98 - 111 mmol/L   BUN 24 (H) 8 - 23 mg/dL   Creatinine, Ser 2.00 (H) 0.61 - 1.24 mg/dL   Glucose, Bld 122 (H) 70 - 99 mg/dL    Comment: Glucose reference range applies only to samples taken after fasting for at least 8 hours.   Calcium, Ion 0.97 (L) 1.15 - 1.40 mmol/L   TCO2 21 (L) 22 - 32 mmol/L   Hemoglobin 15.0 13.0 - 17.0 g/dL   HCT 44.0 39.0 - 52.0 %    CT ABDOMEN PELVIS WO CONTRAST  Addendum Date: 10/19/2020    ADDENDUM REPORT: 10/19/2020 05:45 ADDENDUM: Study discussed by telephone with Dr. Linna Hoff FLOYD on 10/19/2020 at 0530 hours. Electronically Signed   By: Genevie Ann M.D.   On: 10/19/2020 05:45   Result Date: 10/19/2020 CLINICAL DATA:  75 year old male with abdominal swelling and leaking from ostomy bag. Ostomy since November. History of groin plastic surgery in January. EXAM: CT ABDOMEN AND PELVIS WITHOUT CONTRAST TECHNIQUE: Multidetector CT imaging of the abdomen and pelvis was performed following the standard protocol without IV contrast. COMPARISON:  CTA chest 04/12/2020. FINDINGS: Lower chest: Moderate to large left pleural effusion persists, appears not significantly changed since January. Associated compressive left lung atelectasis. Right pleural effusion has regressed from that time, now only trace. Substantially improved right lung base ventilation. Partially visible cardiac pacer leads. No pericardial effusion. Hepatobiliary: Stable liver with several tiny low-density areas in the dome which are likely benign cysts. There is vicarious contrast excretion to the gallbladder on series 3, image 29 which otherwise appears within normal limits. Pancreas: Negative. Spleen: Negative. Adrenals/Urinary Tract: Right nephrolithiasis.  Otherwise negative. Stomach/Bowel: There is a left abdominal double-barrel ostomy, 1 limb of which appears to extend distally and the rectosigmoid colon are decompressed. Descending colon upstream of the ostomy is also fairly decompressed. And the transverse colon is decompressed but extends posterior to dilated gas-filled cecum which is located in the ventral abdomen, and dilated up to nearly 13 cm diameter (coronal image 23). Just distal to the cecum append and somewhat twisted appearance of the mesentery is noted on series 3, image 53 and coronal image 47 compatible with cecal volvulus. Downstream large bowel and upstream small bowel are both decompressed. Superimposed gastrostomy tube  with mild fluid-filled stomach but no adverse features. Duodenum is decompressed. No free air. No free fluid identified in the abdomen. Vascular/Lymphatic: Aortoiliac calcified atherosclerosis. Vascular patency is not evaluated in the absence of IV contrast. No lymphadenopathy. Reproductive: Negative. Other: Postoperative soft tissue changes to the bilateral groin. There is a small volume of pelvic free fluid with simple fluid density. Musculoskeletal: No acute osseous abnormality identified. IMPRESSION: 1. Positive for Cecal Volvulus. Anteriorly positioned and dilated cecum up to 13 cm diameter. Pinched and twisted adjacent mesentery (coronal image 47) with decompressed downstream colon and upstream small bowel. No free air. Small volume of free fluid in the pelvis is likely reactive. 2. Superimposed left abdominal descending colostomy with no adverse features. Gastrostomy tube with no adverse features. 3. Moderate  to large left pleural effusion with compressive atelectasis is stable since January. Right pleural effusion has regressed since that time. 4. Right nephrolithiasis. 5. Aortic Atherosclerosis (ICD10-I70.0). Electronically Signed: By: Genevie Ann M.D. On: 10/19/2020 05:28    ROS 10 point review of systems is negative except as listed above in HPI.   Physical Exam Blood pressure 138/74, pulse 62, temperature 98.1 F (36.7 C), temperature source Oral, resp. rate 13, height 6' (1.829 m), weight 81.6 kg, SpO2 96 %. Constitutional: well-developed, well-nourished HEENT: pupils equal, round, reactive to light, 24m b/l, moist conjunctiva, external inspection of ears and nose normal, hearing intact Oropharynx: normal oropharyngeal mucosa, normal dentition Neck: no thyromegaly, trachea midline, no midline cervical tenderness to palpation Chest: breath sounds equal bilaterally, normal respiratory effort, no midline or lateral chest wall tenderness to palpation/deformity Abdomen: soft, NT, distended with  palpable distended cecum, colostomy of L abdomen, PEG tube in place-maggots underneath dressing and extensive skin maceration surrounding this, no bruising, no hepatosplenomegaly GU: no blood at urethral meatus of penis, no scrotal masses or abnormality  Back: no wounds, no thoracic/lumbar spine tenderness to palpation, no thoracic/lumbar spine stepoffs Rectal: deferred Extremities: 2+ radial and pedal pulses bilaterally, motor and sensation intact to bilateral UE and LE, no peripheral edema MSK: unable to assess gait/station, no clubbing/cyanosis of fingers/toes, normal ROM of all four extremities Skin: warm, dry, no rashes Psych: normal memory, low medical literacy, normal mood/affect    Assessment/Plan: 89M with complex medical history and 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 Discussed with patient the indication for surgery to identify or prevent bowel compromise. Patient requested to delay surgery until early next week. He was counseled that he would likely not survive if he waited that long for intervention. Patient requested time to discuss with family. I returned to see the patient after 431m and he is still not wanting to move forward with surgery and still talking with family. Recommend admission to medicine service at this time with IR consultation for cecostomy tube placement and palliative consultation.   AyJesusita OkaMD General and [facility]

## 2020-10-19 NOTE — ED Provider Notes (Signed)
[facility] EMERGENCY DEPARTMENT Provider Note   CSN: [id number] Arrival date & time: 10/19/20  0348     History Chief Complaint  Patient presents with   Abdominal Pain    Charles Mcdonald is a 75 y.o. male.  75 yo M with a chief complaint of leaking around his ostomy site.  Going on for couple days.  Having some severe diffuse abdominal pain with it as well.  Call 911 today because it got so bad and ended up getting fentanyl and has felt much better since.  Denies fevers or chills.  Denies nausea or vomiting.  Has had normal output from his ostomy.  The history is provided by the patient.  Abdominal Pain Pain location:  Generalized Pain quality: sharp and shooting   Pain radiates to:  Does not radiate Pain severity:  Severe Onset quality:  Gradual Duration:  2 days Timing:  Constant Progression:  Worsening Chronicity:  New Relieved by:  Nothing Worsened by:  Nothing Ineffective treatments:  None tried Associated symptoms: no chest pain, no chills, no diarrhea, no fever, no shortness of breath and no vomiting       Past Medical History:  Diagnosis Date   Acute on chronic respiratory failure with hypoxia (HCC)    Atrial fibrillation, chronic (HCC)    Benign hypertension    CAD (coronary artery disease), native coronary artery 06/19/2020   Chronic HFrEF (heart failure with reduced ejection fraction) (HCC)    Chronic kidney disease, stage III (moderate) (HCC)    Diabetes (HCC)    Gout    Hyperlipidemia     Patient Active Problem List   Diagnosis Date Noted   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)    CAD (coronary artery disease), native coronary artery 06/19/2020   Acute hypokalemia 06/19/2020   Persistent atrial fibrillation ([place]) 06/19/2020   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   Ventricular tachycardia ([place]) 06/16/2020   Chronic HFrEF  (heart failure with reduced ejection fraction) (HCC)    Chronic kidney disease, stage III (moderate) (HCC)    Atrial fibrillation, chronic (HCC)     Past Surgical History:  Procedure Laterality Date   IR THORACENTESIS ASP PLEURAL SPACE W/IMG GUIDE  04/17/2020   SKIN SPLIT GRAFT Bilateral 03/27/2020   Procedure: SKIN GRAFT SPLIT THICKNESS;  Surgeon: Cindra Presume, MD;  Location: [place];  Service: Plastics;  Laterality: Bilateral;   WOUND EXPLORATION Bilateral 03/27/2020   Procedure: Exploration bilateral groin wounds with partial closure;  Surgeon: Cindra Presume, MD;  Location: [place];  Service: Plastics;  Laterality: Bilateral;       Family History  Problem Relation Age of Onset   Heart disease Father    Diabetes Sister     Social History   Tobacco Use   Smoking status: Former    Packs/day: 0.20    Years: 55.00    Pack years: 11.00    Types: Cigarettes    Quit date: 2012    Years since quitting: 10.5   Smokeless tobacco: Never  Vaping Use   Vaping Use: Never used  Substance Use Topics   Alcohol use: Never   Drug use: Never    Home Medications Prior to Admission medications   Medication Sig Start Date End Date Taking? Authorizing Provider  allopurinol (ZYLOPRIM) 100 MG tablet Take 100 mg by mouth daily.    [provider]  amiodarone (PACERONE) 200 MG  tablet Take 1 tablet (200 mg total) by mouth daily. 07/30/20 08/29/20  Croitoru, Mihai, MD  apixaban (ELIQUIS) 5 MG TABS tablet Take 5 mg by mouth every 12 (twelve) hours. 12/01/19   [provider]  ascorbic acid (VITAMIN C) 500 MG tablet Take 500 mg by mouth daily.    [provider]  atorvastatin (LIPITOR) 40 MG tablet Take 40 mg by mouth at bedtime. 11/11/15   [provider]  colchicine 0.6 MG tablet Take 0.6 mg by mouth See admin instructions. Take 0.6 mg by mouth as directed for gout flares    [provider]  ergocalciferol (VITAMIN D2) 1.25 MG (50000 UT) capsule Take 50,000  Units by mouth once a week.    [provider]  furosemide (LASIX) 40 MG tablet Take 1 tablet (40 mg total) by mouth daily. 07/13/20 08/12/20  Donne Hazel, MD  glucose blood test strip Accu-Chek Aviva Plus test strips    [provider]  insulin lispro (HUMALOG) 100 UNIT/ML KwikPen Inject 5 Units into the skin 2 (two) times daily before a meal.    [provider]  LANTUS SOLOSTAR 100 UNIT/ML Solostar Pen Inject 5-10 Units into the skin See admin instructions. Inject 10 units into the skin before breakfast and 5 units at bedtime    [provider]  levothyroxine (SYNTHROID) 25 MCG tablet Take 1 tablet (25 mcg total) by mouth daily before breakfast. 07/26/20   Sande Rives E, PA-C  losartan (COZAAR) 25 MG tablet Take 1 tablet (25 mg total) by mouth daily. 08/29/20 10/28/20  Croitoru, Mihai, MD  metoprolol succinate (TOPROL-XL) 25 MG 24 hr tablet Take 1 tablet (25 mg total) by mouth daily. 08/21/20   Deboraha Sprang, MD  nitroGLYCERIN (NITROSTAT) 0.4 MG SL tablet Place 1 tablet (0.4 mg total) under the tongue every 5 (five) minutes x 3 doses as needed for chest pain. 06/19/20   Barrett, Evelene Croon, PA-C  pantoprazole (PROTONIX) 40 MG tablet Take 40 mg by mouth at bedtime. 04/21/20   [provider]  potassium chloride SA (KLOR-CON) 20 MEQ tablet Take 1 tablet (20 mEq total) by mouth daily. 07/26/20   Darreld Mclean, PA-C    Allergies    Patient has no known allergies.  Review of Systems   Review of Systems  Constitutional:  Negative for chills and fever.  HENT:  Negative for congestion and facial swelling.   Eyes:  Negative for discharge and visual disturbance.  Respiratory:  Negative for shortness of breath.   Cardiovascular:  Negative for chest pain and palpitations.  Gastrointestinal:  Positive for abdominal pain. Negative for diarrhea and vomiting.  Musculoskeletal:  Negative for arthralgias and myalgias.  Skin:  Negative for color change and rash.   Neurological:  Negative for tremors, syncope and headaches.  Psychiatric/Behavioral:  Negative for confusion and dysphoric mood.    Physical Exam Updated Vital Signs BP 138/74   Pulse 62   Temp 98.1 F (36.7 C) (Oral)   Resp 13   Ht 6' (1.829 m)   Wt 81.6 kg   SpO2 96%   BMI 24.41 kg/m   Physical Exam Vitals and nursing note reviewed.  Constitutional:      Appearance: He is well-developed.  HENT:     Head: Normocephalic and atraumatic.  Eyes:     Pupils: Pupils are equal, round, and reactive to light.  Neck:     Vascular: No JVD.  Cardiovascular:     Rate and Rhythm:  Normal rate and regular rhythm.     Heart sounds: No murmur heard.   No friction rub. No gallop.  Pulmonary:     Effort: No respiratory distress.     Breath sounds: No wheezing.  Abdominal:     General: There is no distension.     Tenderness: There is generalized abdominal tenderness. There is no guarding or rebound.     Comments: Ostomy in the left lower quadrant, likely G-tube.  Surrounding the G-tube there is quite a bit of feculent material.  Musculoskeletal:        General: Normal range of motion.     Cervical back: Normal range of motion and neck supple.  Skin:    Coloration: Skin is not pale.     Findings: No rash.  Neurological:     Mental Status: He is alert and oriented to person, place, and time.  Psychiatric:        Behavior: Behavior normal.    ED Results / Procedures / Treatments   Labs (all labs ordered are listed, but only abnormal results are displayed) Labs Reviewed  CBC WITH DIFFERENTIAL/PLATELET - Abnormal; Notable for the following components:      Result Value   Hemoglobin 12.5 (*)    HCT 38.1 (*)    RDW 19.0 (*)    All other components within normal limits  I-STAT CHEM 8, ED - Abnormal; Notable for the following components:   BUN 24 (*)    Creatinine, Ser 2.00 (*)    Glucose, Bld 122 (*)    Calcium, Ion 0.97 (*)    TCO2 21 (*)    All other components within normal  limits  RESP PANEL BY RT-PCR (FLU A&B, COVID) ARPGX2  COMPREHENSIVE METABOLIC PANEL  LIPASE, BLOOD    EKG None  Radiology CT ABDOMEN PELVIS WO CONTRAST  Addendum Date: 10/19/2020   ADDENDUM REPORT: 10/19/2020 05:45 ADDENDUM: Study discussed by telephone with Dr. Linna Hoff Saiquan Hands on 10/19/2020 at 0530 hours. Electronically Signed   By: Genevie Ann M.D.   On: 10/19/2020 05:45   Result Date: 10/19/2020 CLINICAL DATA:  75 year old male with abdominal swelling and leaking from ostomy bag. Ostomy since November. History of groin plastic surgery in January. EXAM: CT ABDOMEN AND PELVIS WITHOUT CONTRAST TECHNIQUE: Multidetector CT imaging of the abdomen and pelvis was performed following the standard protocol without IV contrast. COMPARISON:  CTA chest 04/12/2020. FINDINGS: Lower chest: Moderate to large left pleural effusion persists, appears not significantly changed since January. Associated compressive left lung atelectasis. Right pleural effusion has regressed from that time, now only trace. Substantially improved right lung base ventilation. Partially visible cardiac pacer leads. No pericardial effusion. Hepatobiliary: Stable liver with several tiny low-density areas in the dome which are likely benign cysts. There is vicarious contrast excretion to the gallbladder on series 3, image 29 which otherwise appears within normal limits. Pancreas: Negative. Spleen: Negative. Adrenals/Urinary Tract: Right nephrolithiasis.  Otherwise negative. Stomach/Bowel: There is a left abdominal double-barrel ostomy, 1 limb of which appears to extend distally and the rectosigmoid colon are decompressed. Descending colon upstream of the ostomy is also fairly decompressed. And the transverse colon is decompressed but extends posterior to dilated gas-filled cecum which is located in the ventral abdomen, and dilated up to nearly 13 cm diameter (coronal image 23). Just distal to the cecum append and somewhat twisted appearance of the  mesentery is noted on series 3, image 53 and coronal image 47 compatible with cecal volvulus. Downstream large bowel and  upstream small bowel are both decompressed. Superimposed gastrostomy tube with mild fluid-filled stomach but no adverse features. Duodenum is decompressed. No free air. No free fluid identified in the abdomen. Vascular/Lymphatic: Aortoiliac calcified atherosclerosis. Vascular patency is not evaluated in the absence of IV contrast. No lymphadenopathy. Reproductive: Negative. Other: Postoperative soft tissue changes to the bilateral groin. There is a small volume of pelvic free fluid with simple fluid density. Musculoskeletal: No acute osseous abnormality identified. IMPRESSION: 1. Positive for Cecal Volvulus. Anteriorly positioned and dilated cecum up to 13 cm diameter. Pinched and twisted adjacent mesentery (coronal image 47) with decompressed downstream colon and upstream small bowel. No free air. Small volume of free fluid in the pelvis is likely reactive. 2. Superimposed left abdominal descending colostomy with no adverse features. Gastrostomy tube with no adverse features. 3. Moderate to large left pleural effusion with compressive atelectasis is stable since January. Right pleural effusion has regressed since that time. 4. Right nephrolithiasis. 5. Aortic Atherosclerosis (ICD10-I70.0). Electronically Signed: By: Genevie Ann M.D. On: 10/19/2020 05:28    Procedures Procedures   Medications Ordered in ED Medications  sodium chloride 0.9 % bolus 500 mL (0 mLs Intravenous Stopped 10/19/20 0554)  morphine 2 MG/ML injection 2 mg (2 mg Intravenous Given 10/19/20 0508)  ondansetron (ZOFRAN) injection 4 mg (4 mg Intravenous Given 10/19/20 0533)  piperacillin-tazobactam (ZOSYN) IVPB 3.375 g (0 g Intravenous Stopped 10/19/20 0620)    ED Course  I have reviewed the triage vital signs and the nursing notes.  Pertinent labs & imaging results that were available during my care of the patient were  reviewed by me and considered in my medical decision making (see chart for details).    MDM Rules/Calculators/A&P                          75 yo M with a chief complaints of abdominal pain and leakage reported from his ostomy site though appears to be around his G-tube.  Concern for possible bowel obstruction will obtain a CT scan.  CT scan is concerning for a cecal volvulus.  Discussed with Dr. Bobbye Morton, general surgery to evaluate the patient in the ED.  CRITICAL CARE Performed by: Cecilio Asper   Total critical care time: 35 minutes  Critical care time was exclusive of separately billable procedures and treating other patients.  Critical care was necessary to treat or prevent imminent or life-threatening deterioration.  Critical care was time spent personally by me on the following activities: development of treatment plan with patient and/or surrogate as well as nursing, discussions with consultants, evaluation of patient's response to treatment, examination of patient, obtaining history from patient or surrogate, ordering and performing treatments and interventions, ordering and review of laboratory studies, ordering and review of radiographic studies, pulse oximetry and re-evaluation of patient's condition.  The patients results and plan were reviewed and discussed.   Any x-rays performed were independently reviewed by myself.   Differential diagnosis were considered with the presenting HPI.  Medications  sodium chloride 0.9 % bolus 500 mL (0 mLs Intravenous Stopped 10/19/20 0554)  morphine 2 MG/ML injection 2 mg (2 mg Intravenous Given 10/19/20 0508)  ondansetron (ZOFRAN) injection 4 mg (4 mg Intravenous Given 10/19/20 0533)  piperacillin-tazobactam (ZOSYN) IVPB 3.375 g (0 g Intravenous Stopped 10/19/20 0620)    Vitals:   10/19/20 0354 10/19/20 0400 10/19/20 0430  BP:  (!) 148/76 138/74  Pulse:  65 62  Resp:  16 13  Temp:  98.1 F (36.7 C)   TempSrc:  Oral   SpO2:   96% 96%  Weight: 81.6 kg    Height: 6' (1.829 m)      Final diagnoses:  Cecal volvulus (HCC)    Admission/ observation were discussed with the admitting physician, patient and/or family and they are comfortable with the plan.    Final Clinical Impression(s) / ED Diagnoses Final diagnoses:  Cecal volvulus [facility])    Rx / DC Orders ED Discharge Orders     None        Deno Etienne, DO 10/19/20 [id number]

## 2020-10-19 NOTE — Progress Notes (Signed)
Patient being discharged with home hospice. Will ask [employer] to disable tachyarrhythmia detection and therapies on his ICD.

## 2020-10-19 NOTE — Consult Note (Signed)
[place] Nurse ostomy consult note Patient receiving care in [facility] ED028 In today by EMS with c/o leaking around the ostomy site with abdominal pain. PEG tube site is currently leaking bloody drainage and as I was cleaning around it maggots were seen coming from this site. The stoma site is currently healthy in appearance and does not appear to have any type of leakage from it. The leakage seems to be all from the PEG tube site. Pouch was replaced and 4 x 4 gauze placed around the PEG site after cleaning. Unfortunately the patient outcome with this cecal volvulus is not good and surgery is not an option d/t his poor cardiac state. He will be going home today with Palliative and Hospice care.  Stoma type/location: LLQ colostomy Stomal assessment/size: 1 1/2" pink moist Peristomal assessment: Intact Treatment options for stomal/peristomal skin: skin barrier wipes Output: very little brown thin Ostomy pouching: Removed a 2 pc pouch however only had a one piece with me therefore this 1 pc was placed around the stoma.  Thank you for the consult. [place] nurse will not follow at this time.   Please re-consult the [place] team if needed.  Cathlean Marseilles Tamala Julian, MSN, RN, [place], Lysle Pearl, [facility] Wound Treatment Associate Pager [phone]

## 2020-10-19 NOTE — ED Provider Notes (Signed)
I was notified patient is not going to be admitted.  He has opted for palliative care treatment.  I was asked to prescribe pain medications   Dorie Rank, MD 10/19/20 [phone]

## 2020-10-19 NOTE — ED Notes (Signed)
Cards paged to RN per his request

## 2020-10-19 NOTE — Consult Note (Signed)
ER Consult Note   Charles Mcdonald [id number] DOB: April 03, 1945 DOA: 10/19/2020  PCP: Joline Salt, PA-C Consultants:  [facility] - cardiology; [facility] - plastic surgery Patient coming from:  Home - lives with wife and son; Donald Prose: Wife, [phone]  Chief Complaint: Abdominal pain  HPI: Charles Mcdonald is a 75 y.o. male with medical history significant of afib on Eliquis; HTN; HLD; CAD with h/o VT storm, stenting; chronic systolic CHF; DM; HLD; gout; Fournier's gangrene with resultant ostomy; and stage 3 CKD presenting with leaking from ostomy bag, abdominal pain. In review of Care Everywhere, he was admitted to [place] in 01/2020 for septic and cardiogenic shock.  He had previously had I&D of a penile abscess and developed Fournier's gangrene at the time of this admission; he also had recurrent VT with afib.  He was cardioverted and started on Amio as well as insulin for DKA and taken to the OR for surgical debridement.  He had necrosis of the entire scrotal wall and skin of the penile shaft and ultimately required diverting ostomy with PEG placement.  Echo during that hospitalization showed EF [id number] with diastolic dysfunction and he had a biventricular pacer/AICD placed.  He was discharged to [facility] [place]. He was readmited from 3/26-29 with VT storm with 103 shocks in under 2 hours; this was thought to be secondary to severe hypokalemia, possibly with an ischemic contribution.  He was changed to Amiodarone and stabilized.  He was readmitted from 4/17-21 for acute hypoxemic respiratory failure associated with acute on chronic combined CHF and ischemic heart disease; an unsuccessful attempt was made at a PCI of the LAD and he was sent home with medical management.  He has been doing ok at home since until this problem started.  He noticed issues starting the first of this week.  He was noticing continuous abdominal pain regardless of position.  When he eats just a couple of spoonfuls, he  feels completely full.  For the last couple of days, he hasn't had an much gas or stool in the ostomy.  No n/v.    ED Course: Cecal volvulus.  General surgery saw him and was planning to take to the OR.  Patient decided to defer surgery.  Surgery will still follow in case he changes his mind.  Review of Systems: As per HPI; otherwise review of systems reviewed and negative.   Ambulatory Status:  Ambulates without assistance at home, uses a cane for balance outside the home  COVID Vaccine Status:  Complete plus 1 booster  Past Medical History:  Diagnosis Date   Acute on chronic respiratory failure with hypoxia (HCC)    Atrial fibrillation, chronic (HCC)    Benign hypertension    CAD (coronary artery disease), native coronary artery 06/19/2020   Chronic HFrEF (heart failure with reduced ejection fraction) (HCC)    Chronic kidney disease, stage III (moderate) (HCC)    Diabetes ([place])    Gout    Hyperlipidemia     Past Surgical History:  Procedure Laterality Date   IR THORACENTESIS ASP PLEURAL SPACE W/IMG GUIDE  04/17/2020   SKIN SPLIT GRAFT Bilateral 03/27/2020   Procedure: SKIN GRAFT SPLIT THICKNESS;  Surgeon: Cindra Presume, MD;  Location: [place];  Service: Plastics;  Laterality: Bilateral;   WOUND EXPLORATION Bilateral 03/27/2020   Procedure: Exploration bilateral groin wounds with partial closure;  Surgeon: Cindra Presume, MD;  Location: [place];  Service: Plastics;  Laterality: Bilateral;    Social History  Socioeconomic History   Marital status: Married    Spouse name: Dwan Bolt. Kleen   Number of children: 7   Years of education: Not on file   Highest education level: Some college, no degree  Occupational History   Occupation: Retried  Tobacco Use   Smoking status: Former    Packs/day: 0.20    Years: 55.00    Pack years: 11.00    Types: Cigarettes    Quit date: 2012    Years since quitting: 10.5   Smokeless tobacco: Never  Vaping Use   Vaping Use: Never used   Substance and Sexual Activity   Alcohol use: Never   Drug use: Never   Sexual activity: Not Currently  Other Topics Concern   Not on file  Social History Narrative   Not on file   Social Determinants of Health   Financial Resource Strain: High Risk   Difficulty of Paying Living Expenses: Hard  Food Insecurity: Food Insecurity Present   Worried About [place] in the Last Year: Sometimes true   Ran Out of Food in the Last Year: Sometimes true  Transportation Needs: No Transportation Needs   Lack of Transportation (Medical): No   Lack of Transportation (Non-Medical): No  Physical Activity: Not on file  Stress: Not on file  Social Connections: Not on file  Intimate Partner Violence: Not on file    No Known Allergies  Family History  Problem Relation Age of Onset   Heart disease Father    Diabetes Sister     Prior to Admission medications   Medication Sig Start Date End Date Taking? Authorizing Provider  allopurinol (ZYLOPRIM) 100 MG tablet Take 100 mg by mouth daily.    [provider]  amiodarone (PACERONE) 200 MG tablet Take 1 tablet (200 mg total) by mouth daily. 07/30/20 08/29/20  Croitoru, Mihai, MD  apixaban (ELIQUIS) 5 MG TABS tablet Take 5 mg by mouth every 12 (twelve) hours. 12/01/19   [provider]  ascorbic acid (VITAMIN C) 500 MG tablet Take 500 mg by mouth daily.    [provider]  atorvastatin (LIPITOR) 40 MG tablet Take 40 mg by mouth at bedtime. 11/11/15   [provider]  colchicine 0.6 MG tablet Take 0.6 mg by mouth See admin instructions. Take 0.6 mg by mouth as directed for gout flares    [provider]  ergocalciferol (VITAMIN D2) 1.25 MG (50000 UT) capsule Take 50,000 Units by mouth once a week.    [provider]  furosemide (LASIX) 40 MG tablet Take 1 tablet (40 mg total) by mouth daily. 07/13/20 08/12/20  Donne Hazel, MD  glucose blood test strip Accu-Chek Aviva Plus test strips     [provider]  insulin lispro (HUMALOG) 100 UNIT/ML KwikPen Inject 5 Units into the skin 2 (two) times daily before a meal.    [provider]  LANTUS SOLOSTAR 100 UNIT/ML Solostar Pen Inject 5-10 Units into the skin See admin instructions. Inject 10 units into the skin before breakfast and 5 units at bedtime    [provider]  levothyroxine (SYNTHROID) 25 MCG tablet Take 1 tablet (25 mcg total) by mouth daily before breakfast. 07/26/20   Sande Rives E, PA-C  losartan (COZAAR) 25 MG tablet Take 1 tablet (25 mg total) by mouth daily. 08/29/20 10/28/20  Croitoru, Mihai, MD  metoprolol succinate (TOPROL-XL) 25 MG 24 hr tablet Take 1 tablet (25 mg total) by mouth daily. 08/21/20  Deboraha Sprang, MD  nitroGLYCERIN (NITROSTAT) 0.4 MG SL tablet Place 1 tablet (0.4 mg total) under the tongue every 5 (five) minutes x 3 doses as needed for chest pain. 06/19/20   Barrett, Evelene Croon, PA-C  pantoprazole (PROTONIX) 40 MG tablet Take 40 mg by mouth at bedtime. 04/21/20   [provider]  potassium chloride SA (KLOR-CON) 20 MEQ tablet Take 1 tablet (20 mEq total) by mouth daily. 07/26/20   Darreld Mclean, PA-C    Physical Exam: Vitals:   10/19/20 0830 10/19/20 0845 10/19/20 0900 10/19/20 0915  BP: 131/76 129/76 127/72 129/78  Pulse: (!) 59 60 62 (!) 59  Resp: '13 13 17 17  '$ Temp:      TempSrc:      SpO2: 95% 95% 92% 91%  Weight:      Height:         General:  Appears calm and comfortable and is in NAD - remarkably well-appearing given the circumstances Eyes:  PERRL, EOMI, normal lids, iris ENT:  grossly normal hearing, lips & tongue, mmm; poor dentition Neck:  no LAD, masses or thyromegaly Cardiovascular:  RRR, no m/r/g. No LE edema.  Respiratory:   CTA bilaterally with no wheezes/rales/rhonchi.  Normal respiratory effort. Abdomen:  tympanic, distended, not overly TTP with gentle palpation.  PEG tube in poor hygiene with visible maggots crawling around and into the  wound.  Ostomy in place in LLQ with almost no stool in bag.    Skin:  no rash or induration seen on limited exam Musculoskeletal:  grossly normal tone BUE/BLE, good ROM, no bony abnormality Lower extremity:  No LE edema.  Limited foot exam with no ulcerations.  2+ distal pulses. Psychiatric:  grossly normal mood and affect, speech fluent and appropriate, AOx3 Neurologic:  CN 2-12 grossly intact, moves all extremities in coordinated fashion    Radiological Exams on Admission: Independently reviewed - see discussion in A/P where applicable  CT ABDOMEN PELVIS WO CONTRAST  Addendum Date: 10/19/2020   ADDENDUM REPORT: 10/19/2020 05:45 ADDENDUM: Study discussed by telephone with Dr. Linna Hoff FLOYD on 10/19/2020 at 0530 hours. Electronically Signed   By: Genevie Ann M.D.   On: 10/19/2020 05:45   Result Date: 10/19/2020 CLINICAL DATA:  75 year old male with abdominal swelling and leaking from ostomy bag. Ostomy since November. History of groin plastic surgery in January. EXAM: CT ABDOMEN AND PELVIS WITHOUT CONTRAST TECHNIQUE: Multidetector CT imaging of the abdomen and pelvis was performed following the standard protocol without IV contrast. COMPARISON:  CTA chest 04/12/2020. FINDINGS: Lower chest: Moderate to large left pleural effusion persists, appears not significantly changed since January. Associated compressive left lung atelectasis. Right pleural effusion has regressed from that time, now only trace. Substantially improved right lung base ventilation. Partially visible cardiac pacer leads. No pericardial effusion. Hepatobiliary: Stable liver with several tiny low-density areas in the dome which are likely benign cysts. There is vicarious contrast excretion to the gallbladder on series 3, image 29 which otherwise appears within normal limits. Pancreas: Negative. Spleen: Negative. Adrenals/Urinary Tract: Right nephrolithiasis.  Otherwise negative. Stomach/Bowel: There is a left abdominal double-barrel ostomy,  1 limb of which appears to extend distally and the rectosigmoid colon are decompressed. Descending colon upstream of the ostomy is also fairly decompressed. And the transverse colon is decompressed but extends posterior to dilated gas-filled cecum which is located in the ventral abdomen, and dilated up to nearly 13 cm diameter (coronal image 23). Just distal to the cecum append and somewhat twisted  appearance of the mesentery is noted on series 3, image 53 and coronal image 47 compatible with cecal volvulus. Downstream large bowel and upstream small bowel are both decompressed. Superimposed gastrostomy tube with mild fluid-filled stomach but no adverse features. Duodenum is decompressed. No free air. No free fluid identified in the abdomen. Vascular/Lymphatic: Aortoiliac calcified atherosclerosis. Vascular patency is not evaluated in the absence of IV contrast. No lymphadenopathy. Reproductive: Negative. Other: Postoperative soft tissue changes to the bilateral groin. There is a small volume of pelvic free fluid with simple fluid density. Musculoskeletal: No acute osseous abnormality identified. IMPRESSION: 1. Positive for Cecal Volvulus. Anteriorly positioned and dilated cecum up to 13 cm diameter. Pinched and twisted adjacent mesentery (coronal image 47) with decompressed downstream colon and upstream small bowel. No free air. Small volume of free fluid in the pelvis is likely reactive. 2. Superimposed left abdominal descending colostomy with no adverse features. Gastrostomy tube with no adverse features. 3. Moderate to large left pleural effusion with compressive atelectasis is stable since January. Right pleural effusion has regressed since that time. 4. Right nephrolithiasis. 5. Aortic Atherosclerosis (ICD10-I70.0). Electronically Signed: By: Genevie Ann M.D. On: 10/19/2020 05:28    EKG: pending   Labs on Admission: I have personally reviewed the available labs and imaging studies at the time of the  admission.  Pertinent labs:   WBC 8.9 Hgb 12.5 COVID/flu negative   Assessment/Plan Principal Problem:   Cecal volvulus (HCC) Active Problems:   Chronic HFrEF (heart failure with reduced ejection fraction) (HCC)   Chronic kidney disease, stage III (moderate) (HCC)   Atrial fibrillation, chronic (HCC)   ICD (implantable cardioverter-defibrillator) in place    -Complicated history since 01/2020, when he presented to [facility] with septic shock from Fournier's gangrene as well as cardiogenic shock -He had a prolonged hospitalization followed by LTAC and multiple surgeries with PEG tube placement and diverting colostomy -The abdominal/GU issues have been stable but he has had recurrent cardiac issues -He has a markedly depressed EF (in the 20s) and recurrent VT storm with AICD placement -He was hospitalized for cardiac issues in March and April -He developed acute GI issues this week with decreased PO intake, early satiety, and minimal flatus/stool production -In the ER he was found to have a cecal volvulus -He is at extremely high surgical risk and so IR cecostomy tube placement was considered but declined by IR -As such, the patient was presented with surgery vs. No intervention as his only apparent options -He is a man of faith and prefers to die at home if possible, understanding the options and consequences -He was offered admission for comfort care and declines at this time -Instead, he will be discharged with pain medication and outpatient hospice follow up at home - in case he develops uncontrolled pain at home and needs to go to [facility] -I have requested wound care consultation prior to d/c for the PEG tube region, which is actively draining serosanguinous drainage and is infested with maggots -I have also requested cardiology consultation regarding turning off his AICD -Will provide patient with an out of facility DNR form, as we discussed this and he prefers a natural  death should that highly likely circumstance arise    Karmen Bongo MD [facility]   How to contact the [facility] Attending or Consulting provider [place] or covering provider during after hours 7P -7A, for this patient?  Check the care team in [facility] and look for a) attending/consulting [facility] provider  listed and b) the [facility] team listed Log into [email or web address] and use [place]'s universal password to access. If you do not have the password, please contact the hospital operator. Locate the [facility] provider you are looking for under [facility] and page to a number that you can be directly reached. If you still have difficulty reaching the provider, please page the [facility] (Director on Call) for the Hospitalists listed on amion for assistance.   10/19/2020, 9:37 AM

## 2020-10-19 NOTE — Anesthesia Preprocedure Evaluation (Deleted)
Anesthesia Evaluation    Reviewed: Allergy & Precautions, Patient's Chart, lab work & pertinent test results  History of Anesthesia Complications Negative for: history of anesthetic complications  Airway        Dental   Pulmonary former smoker,           Cardiovascular hypertension, Pt. on medications + CAD and +CHF  + dysrhythmias Atrial Fibrillation + Cardiac Defibrillator    '22 TTE - EF 20 to 25%. There is akinesis of the entire anteroseptal, anterior LV walls and apex. There is severe hypokinesis of all inferoseptal and inferior LV walls. The inferolateral wall is moderately hypokinetic. The left  ventricular internal cavity size was moderately dilated. Grade II diastolic dysfunction (pseudonormalization). Right ventricular systolic function is mildly reduced. There is moderately elevated pulmonary artery systolic pressure. The estimated right ventricular systolic pressure is 123456 mmHg. Left atrial size was moderately dilated. Trivial mitral valve regurgitation.    Neuro/Psych negative neurological ROS  negative psych ROS   GI/Hepatic negative GI ROS, Neg liver ROS,   Endo/Other  diabetes, Insulin DependentHypothyroidism   Renal/GU CRFRenal disease     Musculoskeletal  Gout    Abdominal   Peds  Hematology  On eliquis    Anesthesia Other Findings   Reproductive/Obstetrics                             Anesthesia Physical Anesthesia Plan  ASA: 4 and emergent  Anesthesia Plan: General   Post-op Pain Management:    Induction: Intravenous and Rapid sequence  PONV Risk Score and Plan: 2 and Treatment may vary due to age or medical condition, Ondansetron and Dexamethasone  Airway Management Planned: Oral ETT  Additional Equipment: Arterial line  Intra-op Plan:   Post-operative Plan: Possible Post-op intubation/ventilation  Informed Consent:   Plan Discussed with: CRNA and  Anesthesiologist  Anesthesia Plan Comments:         Anesthesia Quick Evaluation

## 2020-10-19 NOTE — Progress Notes (Signed)
This is a patient our service was consulted for earlier this morning secondary to a cecal volvulus.  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  The patient is very high risk of morbidity/mortality with an operation.  I spent a long time discussing options including no intervention (palliative care and subsequent death), discussing with IR about a cecostomy tube for at least decompression, and operative intervention (we discussed the high risk nature of this very indepth including perioperative risks as well as intraoperative risks).  Initially the patient elected to pursue temporizing treatment with a cecostomy tube.  I spoke with Dr. Dwaine Gale of IR who did not feel this would do much but decompress him and not treat the volvulus and this is not a procedure that he had ever done given its rarity.  I then went back and discussed that this was no longer and option.  We again further discussed in detail the risk of surgery vs no surgery.  His son and his wife were present in the room during all of these discussions.  They understood everything and asked questions which were answered.  I allowed the patient and his family further time to process this and discuss what he would like to do.  He is very fearful of surgery.  I left and have since been notified that the patient would like to go home with hospice services.  I did educate the patient on this selection while I was there and that it is reasonable to try, but if/when he perforates or his colon becomes ischemia this will be very painful and may not be able to be controlled at home.  He understands this.  This is very unfortunate situation for the patient, but appreciate medicine and ED assistance in arranging for comfort measures for this patient upon discharge.  We are available as needed.  Henreitta Cea 9:58 AM 10/19/2020

## 2020-10-19 NOTE — Progress Notes (Signed)
Manufacturing engineer [facility]) Hospital Liaison: RN note    Notified by Dr. Karmen Bongo of patient/family request for [facility] services at home after discharge. Chart and patient information under review by [facility] physician. Hospice eligibility pending currently.    Writer spoke with patient and his wife to initiate education related to hospice philosophy, services and team approach to care. Both verbalized understanding of information given.  Please send signed and completed DNR form home with patient/family. Patient will need prescriptions for discharge comfort medications.     DME needs have been discussed, patient currently has the following equipment in the home: cane.  Patient/family requests the following DME for delivery to the home: none.   Please notify [facility] when patient is ready to leave the unit at discharge. (Call [phone] or [phone] after 5pm.) [facility] information and contact numbers given to patient.      A Please do not hesitate to call with questions.    Thank you,   Farrel Gordon, RN, [place] Hospital Liaison   [phone]

## 2020-10-19 NOTE — ED Triage Notes (Signed)
BIB [facility] from home. Past two days leaking from ostomy bag. Abdomen is swollen, rigid and painful. Had had ostomy since November, no prior issues. He hasn't been able to eat because he will take a few bites and he feels full. 132mg fentanyl given by EMS. Pt was ambulatory on scene.   22G L hand 168/82 72 heart rate 95%  CBG 138

## 2020-10-22 ENCOUNTER — Telehealth: Payer: Self-pay | Admitting: Internal Medicine

## 2020-10-22 NOTE — Telephone Encounter (Signed)
I believe that the pt does not need to be seen by Dr Henrene Pastor in regards to the recent ED visit.  He was seen for what looks like leaking ostomy.  Dr Henrene Pastor do you want to see the pt in the office?

## 2020-10-22 NOTE — Telephone Encounter (Signed)
If appropriate, I will be glad to schedule the appointment. I would like to verify after reading the last OV note from Dr Henrene Pastor. If not appropiate can someone please reach out to patient?

## 2020-10-24 ENCOUNTER — Ambulatory Visit: Payer: Medicare Other | Admitting: Internal Medicine

## 2020-10-25 ENCOUNTER — Encounter: Payer: Self-pay | Admitting: *Deleted

## 2020-10-25 ENCOUNTER — Telehealth: Payer: Self-pay | Admitting: Internal Medicine

## 2020-10-25 NOTE — Telephone Encounter (Signed)
Spoke with patient. Patient stated [facility] informed him that due to his heart complications he is not a candidate for surgery. Looking through chart patient has been formed to see [facility]. Patient states he has not seen Clinic and did not know this information. I have advised patient of rec's. I have sent information of Wound clinic and as to why he was suggested to see them. Patient verbalized understanding and MyChart message also sent informing patient of provider Myrtice Lauth that was advised as well as telephone number so he can schedule appt. Patient verbalized understanding, patient rec to contact office if anything further is needed.

## 2020-10-29 NOTE — Telephone Encounter (Signed)
Lauren do you know anything about this, see below.

## 2020-10-29 NOTE — Telephone Encounter (Signed)
Pt called stating that he called the clinic and asked for provider Domenic Moras and was told that there is no provider under that name there. Pls call him.

## 2020-10-29 NOTE — Telephone Encounter (Signed)
Spoke with patient. Patient to schedule appt at [facility]. I told patient to call office if he can get appointment so his records can be sent. Patient verbalized understanding.

## 2020-10-29 NOTE — Telephone Encounter (Signed)
Patient returned call. States he have reached out to Domenic Moras at [phone]. Unable to get anybody on phone but left message

## 2020-10-29 NOTE — Telephone Encounter (Signed)
Patient returned call, stated he was able to get in touch with Domenic Moras. He stated he has appt tomorrow @ 1230p. Dr Henrene Pastor would you like for me to send his paperwork to wound ostomy clinic? Please advise,thank you

## 2020-10-30 ENCOUNTER — Ambulatory Visit (HOSPITAL_COMMUNITY)
Admission: RE | Admit: 2020-10-30 | Discharge: 2020-10-30 | Disposition: A | Discharge status: Home / Self Care | Payer: Medicare Other | Source: Ambulatory Visit | Attending: Nurse Practitioner | Admitting: Nurse Practitioner

## 2020-10-30 DIAGNOSIS — T85598A Other mechanical complication of other gastrointestinal prosthetic devices, implants and grafts, initial encounter: Secondary | ICD-10-CM

## 2020-10-30 DIAGNOSIS — L259 Unspecified contact dermatitis, unspecified cause: Secondary | ICD-10-CM | POA: Diagnosis not present

## 2020-10-30 DIAGNOSIS — Z433 Encounter for attention to colostomy: Secondary | ICD-10-CM | POA: Diagnosis not present

## 2020-10-30 DIAGNOSIS — Z933 Colostomy status: Secondary | ICD-10-CM | POA: Diagnosis present

## 2020-10-30 DIAGNOSIS — L24B1 Irritant contact dermatitis related to digestive stoma or fistula: Secondary | ICD-10-CM

## 2020-10-30 NOTE — Telephone Encounter (Signed)
Patient has been made aware of rec's. Patient agreeable. Records have been sent to [facility]. Nothing further at this time.

## 2020-10-30 NOTE — Progress Notes (Signed)
[facility]   Reason for visit:  LLQ colostomy and feeding tube.  Recent ED visit for cecal volvulus.  Patient was not a surgical candidate and wished to discharge home with Hospice.  Colostomy is currently productive.  HPI:  History Fournier's gangrene  and s/p diverting colostomy Feeding tube that patient states was never used.   Wife and patient are struggling to care for colostomy and have not been caring for feeding tube at all (flushing, etc)  They are requesting assistance with re-ordering ostomy supplies as well.  ROS  Review of Systems Vital signs:  BP 127/73 (BP Location: Right Arm)   Pulse 68   Temp 98.6 F (37 C) (Oral)   Resp 18   SpO2 90%  Exam:  Physical Exam Abdominal:     Comments: LLQ colostomy LUQ feeding tube Recent Cecal volvulus, discharged home, not a surgical candidate.   Skin:    Findings: Rash present.          Comments: Dark crusting around feeding tube consistent with dried effluent I soak and wash this area and it is improved but remains dark.   Neurological:     Mental Status: He is alert.  Psychiatric:        Mood and Affect: Mood normal.    Stoma type/location:  LLQ colostomy LUQ Feeding tube with dark, thick particulates in tubing.  Patient does not use the tube and eats by mouth only Stomal assessment/size:  Colostomy 1 3/8" stoma, pink and moist  Productive and well budded.  LUQ feeding tube, Thick crusted skin around stoma, extends 8 cm.  Old gauze is taped around feeding tube.  Wife states this was applied at 10/19/20 ED visit and she has not changed it. She has visual difficulties.  There is green and brown effluent oozing from the the stomal opening.  No pain noted.  Peristomal assessment:  Colostomy site is clean. Feeding tube site has dried, crusted effluent to skin.  I soak this with a warm soapy cloth and am able to dislodge the crusting.  The skin remains dark and discolored but intact and painless.  I explain to patient  and spouse that the area needs to be cleaned and clean gauze placed daily.  Treatment options for stomal/peristomal skin: Cleansing with mild soapy water and pat dry.  Apply ostomy pouch or drain sponge to feeding tube. I demonstrate and explain this to wife. She is performing ostomy care and says she changes pouch daily.  This is more her preference to emptying due to visual difficulty.  I will implement a belt to improve the security of pouch. I demonstrate to patient how to empty into toilet and recommend he empties when 1/3 full since his wife is struggling.   Output: soft brown stool.  Ostomy pouching: 2pc. 2 3/4" pouch with convex barrier.  Today, I added a belt and patient feels more secure.   Education provided:  I flush the feeding tube and it takes 100Ml warm water to dislodge all of the firm particles in tubing.  Wife observes this and feels she can do this for him.  They indicate they were not shown how to do this before.     Impression/dx  Contact dermatitis from effluent around feeding tube.  Discussion  Add ostomy belt. Wear at all times (except shower)  Daily flushes to feeding tube with 50Ml warm water.  Plan  I send patient home with large syringe for flushing, a box of drain  sponges for daily use and as needed if drainage is excessive.  I will see them next week for ongoing teaching. I have reached out to Hospice nurse, Farrel Gordon regarding Hospice services.  Patient and wife are appreciative of help today.     Visit time: 75 minutes.   Domenic Moras FNP-BC

## 2020-10-30 NOTE — Discharge Instructions (Addendum)
I will call [facility] to request more supplies.  We added an ostomy belt today.  Wear as much as you can.  We flushed your G tube with 100Ml water to eliminate clog and cut down on drainage.  I soaked the skin to clean it.   Wash it weekly and flush with 1 syringe full of warm water.   Call me anytime. Appointment in clinic next week Domenic Moras  FNP-BC Clinic:  [phone]

## 2020-10-30 NOTE — Telephone Encounter (Signed)
Wound care should be between the patient, general surgery, and his PCP.  GI does not need to be involved.  Thanks

## 2020-11-05 ENCOUNTER — Ambulatory Visit: Payer: Medicare Other | Admitting: Physician Assistant

## 2020-11-05 ENCOUNTER — Ambulatory Visit (HOSPITAL_COMMUNITY)
Admission: RE | Admit: 2020-11-05 | Discharge: 2020-11-05 | Disposition: A | Discharge status: Home / Self Care | Payer: Medicare Other | Source: Ambulatory Visit | Attending: *Deleted | Admitting: *Deleted

## 2020-11-05 ENCOUNTER — Other Ambulatory Visit: Payer: Self-pay

## 2020-11-05 VITALS — BP 130/72 | HR 87 | Ht 72.0 in | Wt 187.0 lb

## 2020-11-05 DIAGNOSIS — I472 Ventricular tachycardia, unspecified: Secondary | ICD-10-CM

## 2020-11-05 DIAGNOSIS — Z433 Encounter for attention to colostomy: Secondary | ICD-10-CM | POA: Diagnosis not present

## 2020-11-05 DIAGNOSIS — I480 Paroxysmal atrial fibrillation: Secondary | ICD-10-CM

## 2020-11-05 DIAGNOSIS — Z933 Colostomy status: Secondary | ICD-10-CM | POA: Diagnosis present

## 2020-11-05 DIAGNOSIS — Z934 Other artificial openings of gastrointestinal tract status: Secondary | ICD-10-CM

## 2020-11-05 DIAGNOSIS — L24B1 Irritant contact dermatitis related to digestive stoma or fistula: Secondary | ICD-10-CM | POA: Diagnosis not present

## 2020-11-05 DIAGNOSIS — E119 Type 2 diabetes mellitus without complications: Secondary | ICD-10-CM

## 2020-11-05 DIAGNOSIS — I5022 Chronic systolic (congestive) heart failure: Secondary | ICD-10-CM

## 2020-11-05 DIAGNOSIS — Z95810 Presence of automatic (implantable) cardiac defibrillator: Secondary | ICD-10-CM | POA: Diagnosis not present

## 2020-11-05 DIAGNOSIS — K562 Volvulus: Secondary | ICD-10-CM

## 2020-11-05 DIAGNOSIS — E785 Hyperlipidemia, unspecified: Secondary | ICD-10-CM

## 2020-11-05 DIAGNOSIS — I2510 Atherosclerotic heart disease of native coronary artery without angina pectoris: Secondary | ICD-10-CM | POA: Diagnosis not present

## 2020-11-05 MED ORDER — LOSARTAN POTASSIUM 50 MG PO TABS: 50.0000 mg | ORAL_TABLET | Freq: Every day | ORAL | 5 refills | Status: DC

## 2020-11-05 NOTE — Progress Notes (Addendum)
Cardiology Office Note:    Date:  11/08/2020   ID:  Charles Mcdonald, DOB [birth year], MRN [id number]  PCP:  Joline Salt, PA-C   [facility] Providers Cardiologist:  Sanda Klein, MD     Referring MD: Joline Salt, PA-C   Chief Complaint  Patient presents with   Follow-up    Seen for Dr. Sallyanne Kuster     History of Present Illness:    Charles Mcdonald is a 75 y.o. 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  He had prolonged hospitalization due to septic shock in November 2021 during which his defibrillator delivered multiple shocks.  He required reconstructive surgery in January 2022 and had more shocks from his defibrillator around the time.  He was discharged from long-term acute care facility earlier this year and presented back with VT storm and had more than 100 conservative defibrillator shocks in the setting of severe hypokalemia.  He was later hospitalized again for acute heart failure exacerbation after his diuretic were discontinued.   Patient was last seen by Dr. Caryl Comes in May 2022 who felt he is not a great CRT-D candidate since his IVCD is not a typical left bundle branch block.  He is VT in March 2020 was monomorphic and responded to ATP but also led to multiple shocks.  He was previously on midodrine, this has been discontinued.  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  He was extremely high surgical risk candidate, IR declined cecostomy tube.  He was offered admission for comfort care but declined.  He was eventually discharged home with outpatient hospice.  Cardiology service has been asked to turn off his AICD.  Patient presents today for follow-up.  His AICD was turned off, however patient was unaware of this.  Since his discharge, he has refused hospice.  His lower abdominal pain has significantly improved and is currently resolved.  His blood pressure is good today, I decided to increase his losartan to 50 mg daily.  On further discussion, he is not ready to be completely DNR yet and prefer ICD shock if needed to bring him back.  I discussed with Dr. Sallyanne Kuster, we plan to bring the patient back on this Thursday to reactivate ICD.  He did eat today.  When he returns, he will need comprehensive metabolic panel, lipid panel and a TSH.   Past Medical History:  Diagnosis Date   Acute on chronic respiratory failure with hypoxia (HCC)    Atrial fibrillation, chronic (HCC)    Benign hypertension    CAD (coronary artery disease), native coronary artery 06/19/2020   Chronic HFrEF (heart failure with reduced ejection fraction) ([place])    Chronic kidney disease, stage III (moderate) (HCC)    Diabetes ([place])    Gout    Hyperlipidemia     Past Surgical History:  Procedure Laterality Date   IR THORACENTESIS ASP PLEURAL SPACE W/IMG GUIDE  04/17/2020  SKIN SPLIT GRAFT Bilateral 03/27/2020   Procedure: SKIN GRAFT SPLIT THICKNESS;  Surgeon: Cindra Presume, MD;  Location: [facility];  Service: Plastics;  Laterality: Bilateral;   WOUND EXPLORATION Bilateral 03/27/2020   Procedure: Exploration bilateral groin wounds with partial closure;  Surgeon: Cindra Presume, MD;  Location: [facility];  Service: Plastics;  Laterality: Bilateral;    Current Medications: Current Meds  Medication Sig   allopurinol (ZYLOPRIM) 100 MG tablet Take 100  mg by mouth daily.   amiodarone (PACERONE) 200 MG tablet TAKE 1 BY MOUTH ONCE DAILY. (Patient taking differently: Take 200 mg by mouth daily.)   apixaban (ELIQUIS) 5 MG TABS tablet Take 5 mg by mouth every 12 (twelve) hours.   ascorbic acid (VITAMIN C) 500 MG tablet Take 500 mg by mouth daily.   atorvastatin (LIPITOR) 40 MG tablet Take 40 mg by mouth at bedtime.   colchicine 0.6 MG tablet Take 0.6 mg by mouth See admin instructions. Take 0.6 mg by mouth as directed for gout flares   ergocalciferol (VITAMIN D2) 1.25 MG (50000 UT) capsule Take 50,000 Units by mouth once a week. Wednesday   glucose blood test strip Accu-Chek Aviva Plus test strips   HYDROcodone-acetaminophen (NORCO/VICODIN) 5-325 MG tablet Take 1 tablet by mouth every 6 (six) hours as needed.   insulin lispro (HUMALOG) 100 UNIT/ML KwikPen Inject 5 Units into the skin 2 (two) times daily before a meal.   LANTUS SOLOSTAR 100 UNIT/ML Solostar Pen Inject 5-10 Units into the skin See admin instructions. Inject 10 units into the skin before breakfast and 5 units at bedtime   levothyroxine (SYNTHROID) 25 MCG tablet Take 1 tablet (25 mcg total) by mouth daily before breakfast.   metoprolol succinate (TOPROL-XL) 25 MG 24 hr tablet Take 1 tablet (25 mg total) by mouth daily.   nitroGLYCERIN (NITROSTAT) 0.4 MG SL tablet Place 1 tablet (0.4 mg total) under the tongue every 5 (five) minutes x 3 doses as needed for chest pain.   pantoprazole (PROTONIX) 40 MG tablet Take 40 mg by mouth at bedtime.   potassium chloride SA (KLOR-CON) 20 MEQ tablet Take 1 tablet (20 mEq total) by mouth daily. (Patient taking differently: Take 30 mEq by mouth daily.)     Allergies:   Patient has no known allergies.   Social History   Socioeconomic History   Marital status: Married    Spouse name: Charles Mcdonald   Number of children: 7   Years of education: Not on file   Highest education level: Some college, no degree  Occupational History   Occupation:  Retried  Tobacco Use   Smoking status: Former    Packs/day: 0.20    Years: 55.00    Pack years: 11.00    Types: Cigarettes    Quit date: 2012    Years since quitting: 10.6   Smokeless tobacco: Never  Vaping Use   Vaping Use: Never used  Substance and Sexual Activity   Alcohol use: Never   Drug use: Never   Sexual activity: Not Currently  Other Topics Concern   Not on file  Social History Narrative   Not on file   Social Determinants of Health   Financial Resource Strain: High Risk   Difficulty of Paying Living Expenses: Hard  Food Insecurity: Food Insecurity Present   Worried About [place] in the Last Year: Sometimes true   Ran Out of Food in the Last Year: Sometimes true  Transportation Needs: No Transportation Needs  Lack of Transportation (Medical): No   Lack of Transportation (Non-Medical): No  Physical Activity: Not on file  Stress: Not on file  Social Connections: Not on file     Family History: The patient's family history includes Diabetes in his sister; Heart disease in his father.  ROS:   Please see the history of present illness.     All other systems reviewed and are negative.  EKGs/Labs/Other Studies Reviewed:    The following studies were reviewed today:  Echo 07/09/2020  1. Left ventricular ejection fraction, by estimation, is 20 to 25%. The  left ventricle has severely decreased function. The left ventricle  demonstrates regional wall motion abnormalities.            There is akinesis of the entire anteroseptal, anterior LV walls and  apex. There is severe hypokinesis of all inferoseptal and inferior LV  walls. The inferolateral wall is moderately hypokinetic. The left  ventricular internal cavity size was  moderately dilated. Left ventricular diastolic parameters are consistent  with Grade II diastolic dysfunction (pseudonormalization).   2. Right ventricular systolic function is mildly reduced. The right  ventricular size is  normal. There is moderately elevated pulmonary artery  systolic pressure. The estimated right ventricular systolic pressure is  123456 mmHg.   3. Left atrial size was moderately dilated.   4. The mitral valve is normal in structure. Trivial mitral valve  regurgitation. No evidence of mitral stenosis.   5. The aortic valve is tricuspid. Aortic valve regurgitation is not  visualized. No aortic stenosis is present.   6. The inferior vena cava is normal in size with <50% respiratory  variability, suggesting right atrial pressure of 8 mmHg.   Comparison(s): Compared to prior study on 06/16/20, the PASP is now 66mHg  (previously 323mg).   EKG:  EKG is not ordered today.    Recent Labs: 07/08/2020: B Natriuretic Peptide 2,369.9 07/12/2020: ALT 15; Magnesium 1.9 08/23/2020: TSH 30.000 10/19/2020: BUN 24; Creatinine, Ser 2.00; Hemoglobin 15.0; Platelets 254; Potassium 4.1; Sodium 137  Recent Lipid Panel    Component Value Date/Time   CHOL 123 06/17/2020 0123   TRIG 99 06/17/2020 0123   HDL 28 (L) 06/17/2020 0123   CHOLHDL 4.4 06/17/2020 0123   VLDL 20 06/17/2020 0123   LDLCALC 75 06/17/2020 0123     Risk Assessment/Calculations:    CHA2DS2-VASc Score = 5  This indicates a 7.2% annual risk of stroke. The patient's score is based upon: CHF History: Yes HTN History: Yes Diabetes History: Yes Stroke History: No Vascular Disease History: Yes Age Score: 1 Gender Score: 0          Physical Exam:    VS:  BP 130/72   Pulse 87   Ht 6' (1.829 m)   Wt 187 lb (84.8 kg)   SpO2 92%   BMI 25.36 kg/m     Wt Readings from Last 3 Encounters:  11/05/20 187 lb (84.8 kg)  10/19/20 180 lb (81.6 kg)  08/29/20 188 lb (85.3 kg)     GEN:  Well nourished, well developed in no acute distress HEENT: Normal NECK: No JVD; No carotid bruits LYMPHATICS: No lymphadenopathy CARDIAC: RRR, no murmurs, rubs, gallops RESPIRATORY:  Clear to auscultation without rales, wheezing or rhonchi  ABDOMEN:  Soft, non-tender, non-distended MUSCULOSKELETAL:  No edema; No deformity  SKIN: Warm and dry NEUROLOGIC:  Alert and oriented x 3 PSYCHIATRIC:  Normal affect   ASSESSMENT:    1. Coronary artery disease involving native coronary  artery of native heart without angina pectoris   2. Chronic HFrEF (heart failure with reduced ejection fraction) ([place])   3. ICD (implantable cardioverter-defibrillator) in place   4. VT (ventricular tachycardia) ([place])   5. PAF (paroxysmal atrial fibrillation) ([place])   6. Controlled type 2 diabetes mellitus without complication, without long-term current use of insulin ([place])   7. Hyperlipidemia LDL goal <70   8. Cecal volvulus (HCC)    PLAN:    In order of problems listed above:  CAD: Denies any chest pain.  Not on aspirin given the need for Eliquis  Chronic systolic heart failure: We will continue to uptitrate heart failure medication.  Increase losartan to 50 mg daily  History of ICD due to low ejection fraction: Recently ICD was turned off as the patient was expected to enroll in hospice and likely passed away soon from cecal volvulus since he is not a surgical candidate.  Surprisingly, patient has refused home hospice and since the hospitalization a month ago, abdominal pain has resolved as well.  We discussed his wish, he still preferred to have defibrillation therapy if he does have recurrent VT.  We will bring the patient back this Thursday to reactivate ICD  History of VT: Due to electrolyte abnormality.  PAF: Continue Eliquis and metoprolol succinate  DM2: Managed by primary care provider  Hyperlipidemia: Continue Lipitor  Cecal volvulus: He was admitted a month ago due to cecal volvulus, he was not a surgical candidate and was discharged with home hospice.  It appears patient has refused home hospice and has since recovered without further abdominal discomfort.        Medication Adjustments/Labs and Tests Ordered: Current medicines are reviewed  at length with the patient today.  Concerns regarding medicines are outlined above.  Orders Placed This Encounter  Procedures   Lipid panel   TSH   Comprehensive metabolic panel   Meds ordered this encounter  Medications   losartan (COZAAR) 50 MG tablet    Sig: Take 1 tablet (50 mg total) by mouth daily.    Dispense:  30 tablet    Refill:  5    Patient Instructions  Medication Instructions:  INCREASE the Losartan to 50 mg once daily  *If you need a refill on your cardiac medications before your next appointment, please call your pharmacy*   Lab Work: Your provider would like for you to have the following labs: fasting lipid, TSH and CMET at your appointment on 11/08/20  If you have labs (blood work) drawn today and your tests are completely normal, you will receive your results only by: [place] (if you have MyChart) OR A paper copy in the mail If you have any lab test that is abnormal or we need to change your treatment, we will call you to review the results.   Testing/Procedures: None ordered   Follow-Up: At [facility], you and your health needs are our priority.  As part of our continuing mission to provide you with exceptional heart care, we have created designated Provider Care Teams.  These Care Teams include your primary Cardiologist (physician) and Advanced Practice Providers (APPs -  Physician Assistants and Nurse Practitioners) who all work together to provide you with the care you need, when you need it.  We recommend signing up for the patient portal called "MyChart".  Sign up information is provided on this After Visit Summary.  MyChart is used to connect with patients for Virtual Visits (Telemedicine).  Patients are able to  view lab/test results, encounter notes, upcoming appointments, etc.  Non-urgent messages can be sent to your provider as well.   To learn more about what you can do with MyChart, go to [email or web address].    Your next  appointment:   Keep your appointment with Dr. Sallyanne Kuster on 11/08/20 at 10:20 am   Signed, Almyra Deforest, [place]  11/08/2020 12:00 AM    [place]

## 2020-11-05 NOTE — Patient Instructions (Signed)
Medication Instructions:  INCREASE the Losartan to 50 mg once daily  *If you need a refill on your cardiac medications before your next appointment, please call your pharmacy*   Lab Work: Your provider would like for you to have the following labs: fasting lipid, TSH and CMET at your appointment on 11/08/20  If you have labs (blood work) drawn today and your tests are completely normal, you will receive your results only by: [place] (if you have MyChart) OR A paper copy in the mail If you have any lab test that is abnormal or we need to change your treatment, we will call you to review the results.   Testing/Procedures: None ordered   Follow-Up: At [facility], you and your health needs are our priority.  As part of our continuing mission to provide you with exceptional heart care, we have created designated Provider Care Teams.  These Care Teams include your primary Cardiologist (physician) and Advanced Practice Providers (APPs -  Physician Assistants and Nurse Practitioners) who all work together to provide you with the care you need, when you need it.  We recommend signing up for the patient portal called "MyChart".  Sign up information is provided on this After Visit Summary.  MyChart is used to connect with patients for Virtual Visits (Telemedicine).  Patients are able to view lab/test results, encounter notes, upcoming appointments, etc.  Non-urgent messages can be sent to your provider as well.   To learn more about what you can do with MyChart, go to [email or web address].    Your next appointment:   Keep your appointment with Dr. Sallyanne Kuster on 11/08/20 at 10:20 am

## 2020-11-05 NOTE — Progress Notes (Signed)
[facility]   Reason for visit:  Follow up teaching for ostomy care, feeding tube care and peritsomal skin care.  HPI:  LLQ colostomy and LUQ feeding tube ROS  Review of Systems  Gastrointestinal:        Cecal volvulus recently, ostomy remains productive.   Vital signs:  BP 113/65 (BP Location: Right Arm)   Pulse 78   Temp 97.9 F (36.6 C) (Oral)   Resp 17   SpO2 95%  Exam:  Physical Exam Skin:    Findings: Rash present.     Comments: Discoloration around feeding tube stoma.     Stoma type/location:  LLQ colostomy Stomal assessment/size:  2" pink patent and producing small amount soft brown stool Peristomal assessment:  intact Treatment options for stomal/peristomal skin: barrier ring and 2 piece pouch.  Output: soft brown stool Ostomy pouching:2pc.  New supplies have finally arrived.   Education provided:  Patient has LUQ feeding tube that is not being used and has not been maintained.  Today,  I am working with his wife/caregiver on flushing this tube every time she changes his ostomy pouch.   With irrigation kit, we walk through irrigating the feed tube, clamping it when done and application of drain sponge.  She is able to complete this with coaching and patient and wife feel they can manage this at home.  I send them home with an irrigation kit and sponges.     Impression/dx  Ostomy care for LLQ colostomy and feeding tube.  Discussion  Education on care.  Now has supply chain figured out. Kyung Rudd)  Plan  Call clinic as needed.  Will continue to inquire about Hospice/palliative care     Visit time: 50 minutes.   Domenic Moras FNP-BC

## 2020-11-05 NOTE — Discharge Instructions (Signed)
FLush feeding tube when changing ostomy pouch.

## 2020-11-07 ENCOUNTER — Encounter: Payer: Self-pay | Admitting: Physician Assistant

## 2020-11-08 ENCOUNTER — Other Ambulatory Visit: Payer: Self-pay

## 2020-11-08 ENCOUNTER — Encounter: Payer: Self-pay | Admitting: Cardiovascular Disease

## 2020-11-08 ENCOUNTER — Ambulatory Visit (INDEPENDENT_AMBULATORY_CARE_PROVIDER_SITE_OTHER): Payer: Medicare Other | Admitting: Cardiovascular Disease

## 2020-11-08 VITALS — BP 122/68 | HR 81 | Resp 18 | Ht 72.0 in | Wt 187.2 lb

## 2020-11-08 DIAGNOSIS — I5022 Chronic systolic (congestive) heart failure: Secondary | ICD-10-CM

## 2020-11-08 DIAGNOSIS — Z95810 Presence of automatic (implantable) cardiac defibrillator: Secondary | ICD-10-CM

## 2020-11-08 DIAGNOSIS — E7800 Pure hypercholesterolemia, unspecified: Secondary | ICD-10-CM

## 2020-11-08 DIAGNOSIS — I480 Paroxysmal atrial fibrillation: Secondary | ICD-10-CM

## 2020-11-08 DIAGNOSIS — Z933 Colostomy status: Secondary | ICD-10-CM

## 2020-11-08 DIAGNOSIS — I472 Ventricular tachycardia, unspecified: Secondary | ICD-10-CM

## 2020-11-08 DIAGNOSIS — N1832 Chronic kidney disease, stage 3b: Secondary | ICD-10-CM

## 2020-11-08 DIAGNOSIS — Z79899 Other long term (current) drug therapy: Secondary | ICD-10-CM

## 2020-11-08 DIAGNOSIS — E039 Hypothyroidism, unspecified: Secondary | ICD-10-CM

## 2020-11-08 DIAGNOSIS — I2510 Atherosclerotic heart disease of native coronary artery without angina pectoris: Secondary | ICD-10-CM

## 2020-11-08 DIAGNOSIS — Z7901 Long term (current) use of anticoagulants: Secondary | ICD-10-CM

## 2020-11-08 DIAGNOSIS — Z5181 Encounter for therapeutic drug level monitoring: Secondary | ICD-10-CM

## 2020-11-08 DIAGNOSIS — E119 Type 2 diabetes mellitus without complications: Secondary | ICD-10-CM

## 2020-11-08 MED ORDER — SACUBITRIL-VALSARTAN 49-51 MG PO TABS: 1.0000 | ORAL_TABLET | Freq: Two times a day (BID) | ORAL | 1 refills | Status: DC

## 2020-11-08 MED ORDER — FUROSEMIDE 40 MG PO TABS: 60.0000 mg | ORAL_TABLET | Freq: Every day | ORAL | 11 refills | Status: DC

## 2020-11-08 NOTE — Patient Instructions (Addendum)
Medication Instructions:  STOP the Losartan  START Entresto 49-51 mg twice daily  INCREASE the Furosemide to 60 mg once daily (a tablet and a half)  *If you need a refill on your cardiac medications before your next appointment, please call your pharmacy*   Lab Work: Your provider would like for you to return in 2 weeks to have the following labs drawn: BNP and BMET. You do not need an appointment for the lab. Once in our office lobby there is a podium where you can sign in and ring the doorbell to alert [name] that you are here. The lab is open from 8:00 am to 4:30 pm; closed for lunch from 12:45pm-1:45pm.  If you have labs (blood work) drawn today and your tests are completely normal, you will receive your results only by: [place] (if you have MyChart) OR A paper copy in the mail If you have any lab test that is abnormal or we need to change your treatment, we will call you to review the results.   Testing/Procedures: None ordered   Follow-Up: At [facility], you and your health needs are our priority.  As part of our continuing mission to provide you with exceptional heart care, we have created designated Provider Care Teams.  These Care Teams include your primary Cardiologist (physician) and Advanced Practice Providers (APPs -  Physician Assistants and Nurse Practitioners) who all work together to provide you with the care you need, when you need it.  We recommend signing up for the patient portal called "MyChart".  Sign up information is provided on this After Visit Summary.  MyChart is used to connect with patients for Virtual Visits (Telemedicine).  Patients are able to view lab/test results, encounter notes, upcoming appointments, etc.  Non-urgent messages can be sent to your provider as well.   To learn more about what you can do with MyChart, go to [email or web address].    Your next appointment:   Follow up in 2-3 weeks for PharmD Follow up first available with Dr.  Sallyanne Kuster

## 2020-11-13 ENCOUNTER — Other Ambulatory Visit: Payer: Self-pay

## 2020-11-14 ENCOUNTER — Other Ambulatory Visit: Payer: Self-pay | Admitting: Cardiovascular Disease

## 2020-11-16 ENCOUNTER — Ambulatory Visit (INDEPENDENT_AMBULATORY_CARE_PROVIDER_SITE_OTHER): Payer: Medicare Other

## 2020-11-16 DIAGNOSIS — I472 Ventricular tachycardia, unspecified: Secondary | ICD-10-CM

## 2020-11-16 LAB — CUP PACEART REMOTE DEVICE CHECK
Battery Remaining Longevity: 15 mo
Battery Voltage: 2.95 V
Brady Statistic AP VP Percent: 0.02 %
Brady Statistic AP VS Percent: 87.06 %
Brady Statistic AS VP Percent: 0 %
Brady Statistic AS VS Percent: 12.91 %
Brady Statistic RA Percent Paced: 86.96 %
Brady Statistic RV Percent Paced: 0.03 %
Date Time Interrogation Session: 20220826103341
HighPow Impedance: 33 Ohm
HighPow Impedance: 44 Ohm
Implantable Lead Implant Date: 20200423
Implantable Lead Implant Date: 20200423
Implantable Lead Location: 753859
Implantable Lead Location: 753860
Implantable Lead Model: 5076
Implantable Lead Model: 6949
Implantable Pulse Generator Implant Date: 20200423
Lead Channel Impedance Value: 266 Ohm
Lead Channel Impedance Value: 342 Ohm
Lead Channel Impedance Value: 4047 Ohm
Lead Channel Impedance Value: 4047 Ohm
Lead Channel Impedance Value: 4047 Ohm
Lead Channel Impedance Value: 4047 Ohm
Lead Channel Impedance Value: 4047 Ohm
Lead Channel Impedance Value: 4047 Ohm
Lead Channel Impedance Value: 4047 Ohm
Lead Channel Impedance Value: 4047 Ohm
Lead Channel Impedance Value: 4047 Ohm
Lead Channel Impedance Value: 4047 Ohm
Lead Channel Impedance Value: 4047 Ohm
Lead Channel Impedance Value: 4047 Ohm
Lead Channel Impedance Value: 4047 Ohm
Lead Channel Impedance Value: 4047 Ohm
Lead Channel Impedance Value: 4047 Ohm
Lead Channel Impedance Value: 437 Ohm
Lead Channel Pacing Threshold Amplitude: 0.875 V
Lead Channel Pacing Threshold Amplitude: 1 V
Lead Channel Pacing Threshold Pulse Width: 0.4 ms
Lead Channel Pacing Threshold Pulse Width: 0.4 ms
Lead Channel Sensing Intrinsic Amplitude: 0.25 mV
Lead Channel Sensing Intrinsic Amplitude: 0.25 mV
Lead Channel Sensing Intrinsic Amplitude: 5.75 mV
Lead Channel Sensing Intrinsic Amplitude: 5.75 mV
Lead Channel Setting Pacing Amplitude: 2 V
Lead Channel Setting Pacing Amplitude: 2 V
Lead Channel Setting Pacing Pulse Width: 0.6 ms
Lead Channel Setting Sensing Sensitivity: 0.9 mV

## 2020-11-16 NOTE — Progress Notes (Signed)
Cardiology Office Note:    Date:  11/16/2020   ID:  Charles Mcdonald, DOB 04-25-1945, MRN [id number]  PCP:  Joline Salt, PA-C   [facility] Providers Cardiologist:  Sanda Klein, MD     Referring MD: Joline Salt, PA-C   No chief complaint on file.   History of Present Illness:    Charles Mcdonald is a 75 y.o. 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  He had a protracted illness with septic shock in November 2021 during which his defibrillator delivered several shocks.  He required reconstructive surgery in January and had more shocks from his defibrillator around that time.  He was discharged from long-term acute care earlier this year and presented with ventricular tachycardia storm and more than 100 consecutive defibrillator shocks in the setting of severe hypokalemia.  He was subsequently hospitalized again for acute heart failure exacerbation when his diuretics were discontinued .  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  He had abdominal pain and some drainage around his colostomy site.  The patient tells me that he was told that "there is nothing they can do for me" and he was advised to go home with palliative  care.  His ICD was programmed with therapies off.  He is back today, almost 1 month later and feels well.  He has no longer had problems with abdominal pain.  He has normal intestinal transit.  He denies any worsening of his chronic shortness of breath, but he does appear to describe orthopnea.  He does not have chest pain, palpitations, dizziness, syncope or defibrillator discharges or lower extremity edema.  He weighs 187 pounds, pretty much the same as he weighed when we last saw him in clinic.  He has not had falls, injuries or bleeding problems.  At his request, I turned the defibrillator back on today.  Device function is normal.  Estimated device longevity is 14 months.  He has a Hospital doctor but the left ventricular port is pinned (unsuccessful placement of an LV lead).  Other lead parameters are all excellent.  He has 86% atrial pacing and less than 0.1% ventricular pacing.  The OptiVol is still under baseline but is steadily improving.  There have been no episodes of atrial fibrillation or ventricular tachycardia.  TSH was 30 in May, when he had only been taking his levothyroxine intermittently.  His liver function tests were normal in April.  He saw Dr. Caryl Comes on 08/17/2020.  He commented that he is probably not a great CRT-D candidate since his IVCD is not a typical left bundle branch block. His VT in March 2022 was monomorphic with a cycle length around 240 ms and generally responded to ATP, but also led to multiple shocks. ATP during charging was turned on, although at the active cycle length conventional ATP would not  be operational.  Hopefully with amiodarone therapy the VT cycle length will lengthen and ATP will be more frequently successful.  He is considering takedown of his colostomy and is referred to [facility] surgery.  Past Medical History:  Diagnosis Date   Acute on chronic respiratory failure with hypoxia (HCC)    Atrial fibrillation, chronic (HCC)     Benign hypertension    CAD (coronary artery disease), native coronary artery 06/19/2020   Chronic HFrEF (heart failure with reduced ejection fraction) (HCC)    Chronic kidney disease, stage III (moderate) (HCC)    Diabetes ([place])    Gout    Hyperlipidemia     Past Surgical History:  Procedure Laterality Date   IR THORACENTESIS ASP PLEURAL SPACE W/IMG GUIDE  04/17/2020   SKIN SPLIT GRAFT Bilateral 03/27/2020   Procedure: SKIN GRAFT SPLIT THICKNESS;  Surgeon: Cindra Presume, MD;  Location: [place];  Service: Plastics;  Laterality: Bilateral;   WOUND EXPLORATION Bilateral 03/27/2020   Procedure: Exploration bilateral groin wounds with partial closure;  Surgeon: Cindra Presume, MD;  Location: [place];  Service: Plastics;  Laterality: Bilateral;    Current Medications: Current Meds  Medication Sig   allopurinol (ZYLOPRIM) 100 MG tablet Take 100 mg by mouth daily.   amiodarone (PACERONE) 200 MG tablet TAKE 1 BY MOUTH ONCE DAILY. (Patient taking differently: Take 200 mg by mouth daily.)   apixaban (ELIQUIS) 5 MG TABS tablet Take 5 mg by mouth every 12 (twelve) hours.   ascorbic acid (VITAMIN C) 500 MG tablet Take 500 mg by mouth daily.   atorvastatin (LIPITOR) 40 MG tablet Take 40 mg by mouth at bedtime.   colchicine 0.6 MG tablet Take 0.6 mg by mouth See admin instructions. Take 0.6 mg by mouth as directed for gout flares   ergocalciferol (VITAMIN D2) 1.25 MG (50000 UT) capsule Take 50,000 Units by mouth once a week. Wednesday   glucose blood test strip Accu-Chek Aviva Plus test strips   HYDROcodone-acetaminophen (NORCO/VICODIN) 5-325 MG tablet Take 1 tablet by mouth every 6 (six) hours as needed.   insulin lispro (HUMALOG) 100 UNIT/ML KwikPen Inject 5 Units into the skin 2 (two) times daily before a meal.   LANTUS SOLOSTAR 100 UNIT/ML Solostar Pen Inject 5-10 Units into the skin See admin instructions. Inject 10 units into the skin before breakfast and 5 units at bedtime   levothyroxine (SYNTHROID)  25 MCG tablet Take 1 tablet (25 mcg total) by mouth daily before breakfast.   metoprolol succinate (TOPROL-XL) 25 MG 24 hr tablet Take 1 tablet (25 mg total) by mouth daily.   nitroGLYCERIN (NITROSTAT) 0.4 MG SL tablet Place 1 tablet (0.4 mg total) under the tongue every 5 (five) minutes x 3 doses as needed for chest pain.   pantoprazole (PROTONIX) 40 MG tablet Take 40 mg by mouth at bedtime.   potassium chloride SA (KLOR-CON) 20 MEQ tablet Take 1 tablet (20 mEq total) by mouth daily. (Patient taking differently: Take 30 mEq by mouth daily.)   sacubitril-valsartan (ENTRESTO) 49-51 MG Take 1 tablet by mouth 2 (two) times daily.   [DISCONTINUED] losartan (COZAAR) 50 MG tablet Take 1 tablet (50 mg total) by mouth daily.     Allergies:   Patient has no known allergies.   Social History   Socioeconomic History   Marital status: Married    Spouse name: Dwan Bolt. Pajak   Number of children: 7   Years of education: Not on file   Highest education level:  Some college, no degree  Occupational History   Occupation: Retried  Tobacco Use   Smoking status: Former    Packs/day: 0.20    Years: 55.00    Pack years: 11.00    Types: Cigarettes    Quit date: 2012    Years since quitting: 10.6   Smokeless tobacco: Never  Vaping Use   Vaping Use: Never used  Substance and Sexual Activity   Alcohol use: Never   Drug use: Never   Sexual activity: Not Currently  Other Topics Concern   Not on file  Social History Narrative   Not on file   Social Determinants of Health   Financial Resource Strain: High Risk   Difficulty of Paying Living Expenses: Hard  Food Insecurity: Food Insecurity Present   Worried About [place] in the Last Year: Sometimes true   Ran Out of Food in the Last Year: Sometimes true  Transportation Needs: No Transportation Needs   Lack of Transportation (Medical): No   Lack of Transportation (Non-Medical): No  Physical Activity: Not on file  Stress: Not on file   Social Connections: Not on file     Family History: The patient's family history includes Diabetes in his sister; Heart disease in his father.  ROS:   Please see the history of present illness.     All other systems reviewed and are negative.  EKGs/Labs/Other Studies Reviewed:    The following studies were reviewed today: Echocardiogram 07/09/2020  Study Result      1. Left ventricular ejection fraction, by estimation, is 20 to 25%. The  left ventricle has severely decreased function. The left ventricle  demonstrates regional wall motion abnormalities.            There is akinesis of the entire anteroseptal, anterior LV walls and  apex. There is severe hypokinesis of all inferoseptal and inferior LV  walls. The inferolateral wall is moderately hypokinetic. The left  ventricular internal cavity size was  moderately dilated. Left ventricular diastolic parameters are consistent  with Grade II diastolic dysfunction (pseudonormalization).   2. Right ventricular systolic function is mildly reduced. The right  ventricular size is normal. There is moderately elevated pulmonary artery  systolic pressure. The estimated right ventricular systolic pressure is  123456 mmHg.   3. Left atrial size was moderately dilated.   4. The mitral valve is normal in structure. Trivial mitral valve  regurgitation. No evidence of mitral stenosis.   5. The aortic valve is tricuspid. Aortic valve regurgitation is not  visualized. No aortic stenosis is present.   6. The inferior vena cava is normal in size with <50% respiratory  variability, suggesting right atrial pressure of 8 mmHg.     EKG:  EKG is not ordered today.  The ekg ordered 08/17/2020 shows sinus rhythm with a very broad IVCD at 176 ms, but not a typical LBBB with a very sharp initial intrinsicoid deflection in V1-V2.  QTc 509 ms  Recent Labs: 07/08/2020: B Natriuretic Peptide 2,369.9 07/12/2020: ALT 15; Magnesium 1.9 08/23/2020: TSH  30.000 10/19/2020: BUN 24; Creatinine, Ser 2.00; Hemoglobin 15.0; Platelets 254; Potassium 4.1; Sodium 137  Recent Lipid Panel    Component Value Date/Time   CHOL 123 06/17/2020 0123   TRIG 99 06/17/2020 0123   HDL 28 (L) 06/17/2020 0123   CHOLHDL 4.4 06/17/2020 0123   VLDL 20 06/17/2020 0123   LDLCALC 75 06/17/2020 0123     Risk Assessment/Calculations:    CHA2DS2-VASc Score = 5  This indicates a 7.2% annual risk of stroke. The patient's score is based upon: CHF History: Yes HTN History: Yes Diabetes History: Yes Stroke History: No Vascular Disease History: Yes Age Score: 1 Gender Score: 0      Physical Exam:    VS:  BP 122/68   Pulse 81   Resp 18   Ht 6' (1.829 m)   Wt 187 lb 3.2 oz (84.9 kg)   SpO2 90%   BMI 25.39 kg/m     Wt Readings from Last 3 Encounters:  11/08/20 187 lb 3.2 oz (84.9 kg)  11/05/20 187 lb (84.8 kg)  10/19/20 180 lb (81.6 kg)      General: Alert, oriented x3, no distress, appears well.  Healthy left subclavian ICD site. Head: no evidence of trauma, PERRL, EOMI, no exophtalmos or lid lag, no myxedema, no xanthelasma; normal ears, nose and oropharynx Neck: normal jugular venous pulsations and no hepatojugular reflux; brisk carotid pulses without delay and no carotid bruits Chest: Diminished breath sounds and dullness to percussion left base consistent with left pleural effusion, clear right lung Cardiovascular: normal position and quality of the apical impulse, regular rhythm, normal first and second heart sounds, no murmurs, rubs or gallops Abdomen: Colostomy right lower quadrant, G-tube epigastric; no tenderness or distention, no masses by palpation, no abnormal pulsatility or arterial bruits, normal bowel sounds, no hepatosplenomegaly Extremities: no clubbing, cyanosis or edema; 2+ radial, ulnar and brachial pulses bilaterally; 2+ right femoral, posterior tibial and dorsalis pedis pulses; 2+ left femoral, posterior tibial and dorsalis pedis  pulses; no subclavian or femoral bruits Neurological: grossly nonfocal Psych: Normal mood and affect   ASSESSMENT:    1. Chronic HFrEF (heart failure with reduced ejection fraction) ([place])   2. Coronary artery disease involving native coronary artery of native heart without angina pectoris   3. ICD (implantable cardioverter-defibrillator) in place   4. VT (ventricular tachycardia) ([place])   5. PAF (paroxysmal atrial fibrillation) ([place])   6. Encounter for monitoring amiodarone therapy   7. Long term (current) use of anticoagulants   8. Stage 3b chronic kidney disease ([place])   9. Controlled type 2 diabetes mellitus without complication, without long-term current use of insulin ([place])   10. Acquired hypothyroidism   11. Hypercholesterolemia   12. Colostomy in place [facility])    PLAN:    In order of problems listed above:  CHF: Probably euvolemic, may be slightly hypervolemic.  The left pleural effusion is chronic, at least since January 2022.  We will try to transition from losartan to [facility].  Continue treatment with loop diuretics and dietary sodium restriction,  metoprolol.  Also need to recheck his liver function tests and TSH when we reevaluate renal function after the medication adjustment.  Not a candidate for SGLT2 inhibitors with history of Fournier's gangrene, which was almost fatal. CAD: He has not had recent angina pectoris or any evidence of coronary events during his multiple recent hospitalizations..  Previously told that he was not a candidate for surgical revascularization and had a failed attempt at PCI to the LAD in 2017. ICD: He has recovered well from his abdominal problem, which ended up being not as critical as initially assessed.  Turned his ventricular tachycardia therapies back on.  Although he has a CRT-D device in place, the LV port is not occupied by a lead.  Dr. Caryl Comes does not think that he would necessarily improve with CRT-D since he has ischemic cardiomyopathy and  does not have a typical LBBB.  VT: Had a VT storm in the setting of major electrolyte imbalances.  This has been quiescent since starting amiodarone and keeping his potassium in normal range. PAFib: No recent episodes of arrhythmia.  Has previously had a cavotricuspid isthmus ablation.  Anticoagulation with Eliquis.  No history of stroke/TIA.  Had problems with paroxysmal atrial fibrillation during his admission with sepsis in November, but not a lot of issues since then. Amiodarone: Recheck liver and thyroid function tests with next lab draw. Anticoagulation: Well-tolerated, no bleeding issues. CKD 3B: Creatinine has been stable around 2.0, corresponding to a GFR of about 30-35 DM: Pretty good control especially considering the seriousness of his recent medical problems.  Should not receive SGLT2 inhibitors due to his history of Fournier's gangrene. Hypothyroidism: He has been taking the levothyroxine daily.  We will need to recheck his TSH.  It may not be back to baseline yet. HLP: LDL cholesterol is not quite at target at 75.  We will recheck his lipid profile as well at his next blood draw. Preop CV exam: He wants his G-tube removed. He is considering a colostomy takedown.  Considering the complexity of his previous perineal surgery, this may not be an easy surgery and he has been through a lot of cardiovascular complications recently.  I would advise waiting for at least 6 months of stability from his most recent acute event, that is through October 2022, until he undergoes another major surgical procedure.  He will not require "bridging" anticoagulation, but it is important that he be fully compensated and euvolemic at the time of the procedure.        Medication Adjustments/Labs and Tests Ordered: Current medicines are reviewed at length with the patient today.  Concerns regarding medicines are outlined above.  Orders Placed This Encounter  Procedures   Brain natriuretic peptide   Basic  metabolic panel   Comprehensive Metabolic Panel (CMET)   Lipid Profile   TSH + free T4   Ambulatory referral to General Surgery   Meds ordered this encounter  Medications   furosemide (LASIX) 40 MG tablet    Sig: Take 1.5 tablets (60 mg total) by mouth daily.    Dispense:  45 tablet    Refill:  11   sacubitril-valsartan (ENTRESTO) 49-51 MG    Sig: Take 1 tablet by mouth 2 (two) times daily.    Dispense:  60 tablet    Refill:  1    Patient Instructions  Medication Instructions:  STOP the Losartan  START Entresto 49-51 mg twice daily  INCREASE the Furosemide to 60 mg once daily (a tablet and a half)  *If you need a refill on your cardiac medications before your next appointment, please call your pharmacy*   Lab Work: Your provider would like for you to return in 2 weeks to have the following labs drawn: BNP and BMET. You do not need an appointment for the lab. Once in our office lobby there is a podium where you can sign in and ring the doorbell to alert [name] that you are here. The lab is open from 8:00 am to 4:30 pm; closed for lunch from 12:45pm-1:45pm.  If you have labs (blood work) drawn today and your tests are completely normal, you will receive your results only by: [place] (if you have MyChart) OR A paper copy in the mail If you have any lab test that is abnormal or we need to change your treatment, we will call you to review the results.  Testing/Procedures: None ordered   Follow-Up: At [facility], you and your health needs are our priority.  As part of our continuing mission to provide you with exceptional heart care, we have created designated Provider Care Teams.  These Care Teams include your primary Cardiologist (physician) and Advanced Practice Providers (APPs -  Physician Assistants and Nurse Practitioners) who all work together to provide you with the care you need, when you need it.  We recommend signing up for the patient portal called  "MyChart".  Sign up information is provided on this After Visit Summary.  MyChart is used to connect with patients for Virtual Visits (Telemedicine).  Patients are able to view lab/test results, encounter notes, upcoming appointments, etc.  Non-urgent messages can be sent to your provider as well.   To learn more about what you can do with MyChart, go to [email or web address].    Your next appointment:   Follow up in 2-3 weeks for PharmD Follow up first available with Dr. Sallyanne Kuster    Signed, Sanda Klein, MD  11/16/2020 2:01 PM    [place]

## 2020-11-19 ENCOUNTER — Encounter: Payer: Medicare Other | Admitting: Cardiovascular Disease

## 2020-11-22 ENCOUNTER — Other Ambulatory Visit: Payer: Self-pay

## 2020-11-22 ENCOUNTER — Ambulatory Visit (INDEPENDENT_AMBULATORY_CARE_PROVIDER_SITE_OTHER): Payer: Medicare Other | Admitting: Pharmacist

## 2020-11-22 VITALS — BP 118/64 | HR 77 | Resp 17 | Ht 72.0 in | Wt 170.0 lb

## 2020-11-22 DIAGNOSIS — N1832 Chronic kidney disease, stage 3b: Secondary | ICD-10-CM | POA: Diagnosis not present

## 2020-11-22 DIAGNOSIS — I2510 Atherosclerotic heart disease of native coronary artery without angina pectoris: Secondary | ICD-10-CM | POA: Diagnosis not present

## 2020-11-22 DIAGNOSIS — I5022 Chronic systolic (congestive) heart failure: Secondary | ICD-10-CM | POA: Diagnosis not present

## 2020-11-22 DIAGNOSIS — E1122 Type 2 diabetes mellitus with diabetic chronic kidney disease: Secondary | ICD-10-CM | POA: Diagnosis not present

## 2020-11-22 LAB — BASIC METABOLIC PANEL
BUN/Creatinine Ratio: 13 (ref 10–24)
BUN: 30 mg/dL — ABNORMAL HIGH (ref 8–27)
CO2: 27 mmol/L (ref 20–29)
Calcium: 10.7 mg/dL — ABNORMAL HIGH (ref 8.6–10.2)
Chloride: 100 mmol/L (ref 96–106)
Creatinine, Ser: 2.33 mg/dL — ABNORMAL HIGH (ref 0.76–1.27)
Glucose: 106 mg/dL — ABNORMAL HIGH (ref 65–99)
Potassium: 4.9 mmol/L (ref 3.5–5.2)
Sodium: 143 mmol/L (ref 134–144)
eGFR: 29 mL/min/{1.73_m2} — ABNORMAL LOW (ref >59)

## 2020-11-22 NOTE — Patient Instructions (Addendum)
It was nice meeting you today  We would like to keep your blood pressure less than 130/80  Please continue your metoprolol mg once a day Please continue your Entresto 49-'51mg'$  twice a day Please continue your furosemide mg (1 and 1/2 tablets) once a day  We will update your lab work today and I will call you with your results  Karren Cobble, PharmD, BCACP, [place], [place]. [address] [place], [place] [address] Phone: [phone]; Fax: [phone] 11/22/2020 11:23 AM

## 2020-11-22 NOTE — Progress Notes (Signed)
Patient ID: JERIK YILDIZ                 DOB: 1945-04-27                      MRN: [id number]     HPI: Charles Mcdonald is a 75 y.o. male referred by Dr. Montel Culver to pharmacy clinic for HF medication management. PMH is significant for CKD, HF, A Fib and T2DM. Most recent LVEF 20-25% on 07/09/20.  Patient presents today with his wife. He reports he feels well.  Symptomatically, she is feeling well, denies dizziness, lightheadedness, and fatigue. Denies chest pain or palpitations. Feels SOB only when walking up many stairs.  Able to complete all ADLs.  Appetite has been strong.  Wife cooks his meals. She cooks food with salt but he does not add salt to his meals.  At last visit, losartan was switched to [facility] and patient was referred for CHF management.  Is not able to take SGLT2i due to gangrene.    Current CHF meds: furosemide '60mg'$  daily, Toprol '25mg'$  daily, Entresto 49-51 mg BID Previously tried: losartan BP goal: <130/80  Has blood pressure cuff at home but has not been using.  Is due for BMP and BNP today. Monitors blood sugar using Libre sensor which he reports is "doing good."  Wt Readings from Last 3 Encounters:  11/08/20 187 lb 3.2 oz (84.9 kg)  11/05/20 187 lb (84.8 kg)  10/19/20 180 lb (81.6 kg)   BP Readings from Last 3 Encounters:  11/08/20 122/68  11/05/20 113/65  11/05/20 130/72   Pulse Readings from Last 3 Encounters:  11/08/20 81  11/05/20 78  11/05/20 87    Renal function: CrCl cannot be calculated (Patient's most recent lab result is older than the maximum 21 days allowed.).  Past Medical History:  Diagnosis Date   Acute on chronic respiratory failure with hypoxia (HCC)    Atrial fibrillation, chronic (HCC)    Benign hypertension    CAD (coronary artery disease), native coronary artery 06/19/2020   Chronic HFrEF (heart failure with reduced ejection fraction) (HCC)    Chronic kidney disease, stage III (moderate) (HCC)    Diabetes (HCC)    Gout     Hyperlipidemia     Current Outpatient Medications on File Prior to Visit  Medication Sig Dispense Refill   allopurinol (ZYLOPRIM) 100 MG tablet Take 100 mg by mouth daily.     amiodarone (PACERONE) 200 MG tablet TAKE 1 BY MOUTH ONCE DAILY. (Patient taking differently: Take 200 mg by mouth daily.) 90 tablet 3   apixaban (ELIQUIS) 5 MG TABS tablet Take 5 mg by mouth every 12 (twelve) hours.     ascorbic acid (VITAMIN C) 500 MG tablet Take 500 mg by mouth daily.     atorvastatin (LIPITOR) 40 MG tablet Take 40 mg by mouth at bedtime.     colchicine 0.6 MG tablet Take 0.6 mg by mouth See admin instructions. Take 0.6 mg by mouth as directed for gout flares     ergocalciferol (VITAMIN D2) 1.25 MG (50000 UT) capsule Take 50,000 Units by mouth once a week. Wednesday     furosemide (LASIX) 40 MG tablet Take 1.5 tablets (60 mg total) by mouth daily. 45 tablet 11   glucose blood test strip Accu-Chek Aviva Plus test strips     HYDROcodone-acetaminophen (NORCO/VICODIN) 5-325 MG tablet Take 1 tablet by mouth every 6 (six) hours as needed. 20 tablet 0  insulin lispro (HUMALOG) 100 UNIT/ML KwikPen Inject 5 Units into the skin 2 (two) times daily before a meal.     LANTUS SOLOSTAR 100 UNIT/ML Solostar Pen Inject 5-10 Units into the skin See admin instructions. Inject 10 units into the skin before breakfast and 5 units at bedtime     levothyroxine (SYNTHROID) 25 MCG tablet Take 1 tablet (25 mcg total) by mouth daily before breakfast. 30 tablet 1   metoprolol succinate (TOPROL-XL) 25 MG 24 hr tablet Take 1 tablet (25 mg total) by mouth daily. 90 tablet 3   nitroGLYCERIN (NITROSTAT) 0.4 MG SL tablet Place 1 tablet (0.4 mg total) under the tongue every 5 (five) minutes x 3 doses as needed for chest pain. 25 tablet 12   pantoprazole (PROTONIX) 40 MG tablet Take 40 mg by mouth at bedtime.     potassium chloride SA (KLOR-CON) 20 MEQ tablet Take 1 tablet (20 mEq total) by mouth daily. (Patient taking differently: Take  30 mEq by mouth daily.) 45 tablet 6   sacubitril-valsartan (ENTRESTO) 49-51 MG Take 1 tablet by mouth 2 (two) times daily. 60 tablet 1   No current facility-administered medications on file prior to visit.    No Known Allergies   Assessment/Plan:  1. CHF -  Patient BP when roomed 142/84.  Rechecked after appointment and had decreased to 118/64 which is at goal of <130/80.  Patient tolerating Entresto well with no adverse effects.  Reports frequent urination, especially in morning.  Pending results of BMP, may be able to reduce dose of furosemide since patient is have no SOB or LEE. If labs stable will increase Entresto to max dose.  Patient voiced understanding.  Karren Cobble, PharmD, BCACP, [place], [place] [id number] [address]. [address] [place], [place] [address] Phone: [phone]; Fax: [phone] 11/22/2020 4:52 PM

## 2020-11-23 LAB — BRAIN NATRIURETIC PEPTIDE: BNP: 948.6 pg/mL — ABNORMAL HIGH (ref 0.0–100.0)

## 2020-11-28 NOTE — Progress Notes (Signed)
Remote ICD transmission.   

## 2020-11-30 ENCOUNTER — Other Ambulatory Visit: Payer: Self-pay | Admitting: *Deleted

## 2020-11-30 MED ORDER — FUROSEMIDE 40 MG PO TABS: 40.0000 mg | ORAL_TABLET | Freq: Every day | ORAL | 11 refills | Status: AC

## 2020-12-17 ENCOUNTER — Other Ambulatory Visit: Payer: Self-pay | Admitting: Student

## 2020-12-17 DIAGNOSIS — E039 Hypothyroidism, unspecified: Secondary | ICD-10-CM

## 2020-12-24 ENCOUNTER — Encounter: Payer: Medicare Other | Admitting: Cardiovascular Disease

## 2021-01-02 ENCOUNTER — Other Ambulatory Visit: Payer: Self-pay | Admitting: Cardiovascular Disease

## 2021-02-18 ENCOUNTER — Other Ambulatory Visit: Payer: Self-pay

## 2021-02-18 ENCOUNTER — Ambulatory Visit (INDEPENDENT_AMBULATORY_CARE_PROVIDER_SITE_OTHER): Payer: Medicare Other | Admitting: Cardiovascular Disease

## 2021-02-18 ENCOUNTER — Encounter: Payer: Self-pay | Admitting: Cardiovascular Disease

## 2021-02-18 VITALS — BP 114/60 | HR 67 | Ht 72.0 in | Wt 188.4 lb

## 2021-02-18 DIAGNOSIS — N184 Chronic kidney disease, stage 4 (severe): Secondary | ICD-10-CM

## 2021-02-18 DIAGNOSIS — I5022 Chronic systolic (congestive) heart failure: Secondary | ICD-10-CM

## 2021-02-18 DIAGNOSIS — Z95810 Presence of automatic (implantable) cardiac defibrillator: Secondary | ICD-10-CM

## 2021-02-18 DIAGNOSIS — Z01810 Encounter for preprocedural cardiovascular examination: Secondary | ICD-10-CM

## 2021-02-18 DIAGNOSIS — E118 Type 2 diabetes mellitus with unspecified complications: Secondary | ICD-10-CM

## 2021-02-18 DIAGNOSIS — E039 Hypothyroidism, unspecified: Secondary | ICD-10-CM

## 2021-02-18 DIAGNOSIS — I2510 Atherosclerotic heart disease of native coronary artery without angina pectoris: Secondary | ICD-10-CM

## 2021-02-18 DIAGNOSIS — I480 Paroxysmal atrial fibrillation: Secondary | ICD-10-CM | POA: Diagnosis not present

## 2021-02-18 DIAGNOSIS — Z5181 Encounter for therapeutic drug level monitoring: Secondary | ICD-10-CM

## 2021-02-18 DIAGNOSIS — Z7901 Long term (current) use of anticoagulants: Secondary | ICD-10-CM

## 2021-02-18 DIAGNOSIS — Z79899 Other long term (current) drug therapy: Secondary | ICD-10-CM | POA: Diagnosis not present

## 2021-02-18 DIAGNOSIS — E7800 Pure hypercholesterolemia, unspecified: Secondary | ICD-10-CM

## 2021-02-18 DIAGNOSIS — J90 Pleural effusion, not elsewhere classified: Secondary | ICD-10-CM

## 2021-02-18 DIAGNOSIS — I472 Ventricular tachycardia, unspecified: Secondary | ICD-10-CM

## 2021-02-18 NOTE — Patient Instructions (Addendum)
Medication Instructions:  No changes *If you need a refill on your cardiac medications before your next appointment, please call your pharmacy*   Lab Work: Your provider would like for you to have the following labs today: CBC, Lipid, A1C, BNP, CMET, TSH  If you have labs (blood work) drawn today and your tests are completely normal, you will receive your results only by: [place] (if you have MyChart) OR A paper copy in the mail If you have any lab test that is abnormal or we need to change your treatment, we will call you to review the results.   Testing/Procedures: None ordered   Follow-Up: At [facility], you and your health needs are our priority.  As part of our continuing mission to provide you with exceptional heart care, we have created designated Provider Care Teams.  These Care Teams include your primary Cardiologist (physician) and Advanced Practice Providers (APPs -  Physician Assistants and Nurse Practitioners) who all work together to provide you with the care you need, when you need it.  We recommend signing up for the patient portal called "MyChart".  Sign up information is provided on this After Visit Summary.  MyChart is used to connect with patients for Virtual Visits (Telemedicine).  Patients are able to view lab/test results, encounter notes, upcoming appointments, etc.  Non-urgent messages can be sent to your provider as well.   To learn more about what you can do with MyChart, go to [email or web address].    Your next appointment:   6 month(s)  The format for your next appointment:   In Person  Provider:   Sanda Klein, MD     Other Instructions A referral has been placed to Nephrology  You may call [facility] at [phone]

## 2021-02-18 NOTE — Progress Notes (Signed)
Cardiology Office Note:    Date:  02/18/2021   ID:  Charles Mcdonald, DOB 05-25-1945, MRN [id number]  PCP:  Joline Salt, PA-C   [facility] Providers Cardiologist:  Sanda Klein, MD     Referring MD: Joline Salt, PA-C   Chief Complaint  Patient presents with   Congestive Heart Failure     History of Present Illness:    Charles Mcdonald is a 75 y.o. 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  He had a protracted illness with septic shock in November 2021 during which his defibrillator delivered several shocks.  He required reconstructive surgery in January and had more shocks from his defibrillator around that time.  He was discharged from long-term acute care earlier this year and presented with ventricular tachycardia storm and more than 100 consecutive defibrillator shocks in the setting of severe hypokalemia.  He was subsequently hospitalized again for acute heart failure exacerbation when his diuretics were discontinued .  10/19/2020 he presented with abdominal pain and was diagnosed with cecal volvulus.  He was told that he cannot be helped and his defibrillator was turned off, but he returned to our office 3-4 weeks later, he feels  that he has improved with resolution of all his abdominal complaints and good functional status.  We turned the ICD therapies back on.   He feels stronger.  He is eating well.  His weight is steady at about 188 pounds.  He denies angina or dyspnea at rest or with activity.  He does not have orthopnea, PND or lower extremity edema.  He still wants to have his G-tube removed and would like to eventually have his colostomy taken down if possible.  He denies falls, injuries or bleeding problems.  His OptiVol is steadily improving and is almost back to baseline on his defibrillator.  ICD function is normal.  He has not had any episodes of atrial fibrillation or ventricular tachycardia and has not received any shocks.  Battery longevity was seriously compromised by his episode of VT storm and he only has about 11 months of anticipated battery left.  He has 94% atrial pacing but never has ventricular pacing.  Even though he has a CRT-D device implanted, his LV lead was pinned, since LV lead placement was unsuccessful.  He saw Dr. Caryl Comes on 08/17/2020.  He commented that he is probably not a great CRT-D candidate since his IVCD is not a typical left bundle branch block. His VT in March 2022 was monomorphic with a cycle length around 240 ms and generally responded to ATP, but also led to multiple shocks. ATP during charging was turned on, although at the active cycle length conventional ATP would not be operational.  Hopefully with amiodarone therapy the VT cycle length will lengthen and ATP will be more frequently successful.  He is considering takedown of his colostomy and is referred to [facility] surgery.  Past Medical History:  Diagnosis Date   Acute on chronic respiratory failure with hypoxia (HCC)    Atrial fibrillation, chronic (HCC)    Benign hypertension    CAD (coronary artery disease), native coronary artery 06/19/2020   Chronic HFrEF (heart failure with reduced ejection fraction) (HCC)     Chronic kidney disease, stage III (moderate) (HCC)    Diabetes ([place])    Gout    Hyperlipidemia     Past Surgical History:  Procedure Laterality Date   IR THORACENTESIS ASP PLEURAL SPACE W/IMG GUIDE  04/17/2020   SKIN SPLIT GRAFT Bilateral 03/27/2020   Procedure: SKIN GRAFT SPLIT THICKNESS;  Surgeon: Cindra Presume, MD;  Location: [place];  Service: Plastics;  Laterality: Bilateral;   WOUND EXPLORATION Bilateral 03/27/2020   Procedure: Exploration bilateral groin wounds with partial closure;  Surgeon: Cindra Presume, MD;  Location: [place];  Service: Plastics;  Laterality: Bilateral;    Current Medications: Current Meds  Medication Sig   allopurinol (ZYLOPRIM) 100 MG tablet Take 100 mg by mouth daily.   amiodarone (PACERONE) 200 MG tablet TAKE 1 BY MOUTH ONCE DAILY. (Patient taking differently: Take 200 mg by mouth daily.)   apixaban (ELIQUIS) 5 MG TABS tablet Take 5 mg by mouth every 12 (twelve) hours.   ascorbic acid (VITAMIN C) 500 MG tablet Take 500 mg by mouth daily.   atorvastatin (LIPITOR) 40 MG tablet Take 40 mg by mouth at bedtime.   colchicine 0.6 MG tablet Take 0.6 mg by mouth See admin instructions. Take 0.6 mg by mouth as directed for gout flares   ENTRESTO 49-51 MG TAKE 1 TABLET BY MOUTH 2 (TWO) TIMES DAILY.   ergocalciferol (VITAMIN D2) 1.25 MG (50000 UT) capsule Take 50,000 Units by mouth once a week. Wednesday   furosemide (LASIX) 40 MG tablet Take 1 tablet (40 mg total) by mouth daily.   glucose blood test strip Accu-Chek Aviva Plus test strips   insulin lispro (HUMALOG) 100 UNIT/ML KwikPen Inject 5 Units into the skin 2 (two) times daily before a meal.   LANTUS SOLOSTAR 100 UNIT/ML Solostar Pen Inject 5-10 Units into the skin See admin instructions. Inject 10 units into the skin before breakfast and 5 units at bedtime   levothyroxine (SYNTHROID) 25 MCG tablet TAKE 1 TABLET BY MOUTH ONCE DAILY IN THE MORNING ON EMPTY STOMACH 30 TO 45 MINUTES BEFORE BREAKFAST.   metoprolol  succinate (TOPROL-XL) 25 MG 24 hr tablet Take 1 tablet (25 mg total) by mouth daily.   pantoprazole (PROTONIX) 40 MG tablet Take 40 mg by mouth at bedtime.   potassium chloride 20 MEQ/15ML (10%) SOLN Take 20 mEq by mouth.     Allergies:   Farxiga [dapagliflozin] and Jardiance [empagliflozin]   Social History   Socioeconomic History   Marital status: Married    Spouse name: Dwan Bolt. Ferrie   Number of children: 7   Years of education: Not on file   Highest education level: Some college, no degree  Occupational History   Occupation: Retried  Tobacco Use   Smoking status: Former    Packs/day: 0.20    Years: 55.00    Pack years: 11.00    Types: Cigarettes    Quit date: 2012    Years since quitting: 10.9   Smokeless tobacco: Never  Vaping Use   Vaping Use: Never used  Substance and Sexual Activity   Alcohol use: Never   Drug use: Never   Sexual activity: Not Currently  Other Topics  Concern   Not on file  Social History Narrative   Not on file   Social Determinants of Health   Financial Resource Strain: High Risk   Difficulty of Paying Living Expenses: Hard  Food Insecurity: Food Insecurity Present   Worried About Running Out of Food in the Last Year: Sometimes true   Ran Out of Food in the Last Year: Sometimes true  Transportation Needs: No Transportation Needs   Lack of Transportation (Medical): No   Lack of Transportation (Non-Medical): No  Physical Activity: Not on file  Stress: Not on file  Social Connections: Not on file     Family History: The patient's family history includes Diabetes in his sister; Heart disease in his father.  ROS:   Please see the history of present illness.     All other systems reviewed and are negative.  EKGs/Labs/Other Studies Reviewed:    The following studies were reviewed today: Echocardiogram 07/09/2020  Study Result      1. Left ventricular ejection fraction, by estimation, is 20 to 25%. The  left ventricle has  severely decreased function. The left ventricle  demonstrates regional wall motion abnormalities.            There is akinesis of the entire anteroseptal, anterior LV walls and  apex. There is severe hypokinesis of all inferoseptal and inferior LV  walls. The inferolateral wall is moderately hypokinetic. The left  ventricular internal cavity size was  moderately dilated. Left ventricular diastolic parameters are consistent  with Grade II diastolic dysfunction (pseudonormalization).   2. Right ventricular systolic function is mildly reduced. The right  ventricular size is normal. There is moderately elevated pulmonary artery  systolic pressure. The estimated right ventricular systolic pressure is  09.6 mmHg.   3. Left atrial size was moderately dilated.   4. The mitral valve is normal in structure. Trivial mitral valve  regurgitation. No evidence of mitral stenosis.   5. The aortic valve is tricuspid. Aortic valve regurgitation is not  visualized. No aortic stenosis is present.   6. The inferior vena cava is normal in size with <50% respiratory  variability, suggesting right atrial pressure of 8 mmHg.     EKG:  EKG is not ordered today.  The ekg ordered 08/17/2020 shows sinus rhythm with a very broad IVCD at 176 ms, but not a typical LBBB with a very sharp initial intrinsicoid deflection in V1-V2.  QTc 509 ms  Recent Labs: 07/12/2020: ALT 15; Magnesium 1.9 08/23/2020: TSH 30.000 10/19/2020: Hemoglobin 15.0; Platelets 254 11/22/2020: BNP 948.6; BUN 30; Creatinine, Ser 2.33; Potassium 4.9; Sodium 143  Recent Lipid Panel    Component Value Date/Time   CHOL 123 06/17/2020 0123   TRIG 99 06/17/2020 0123   HDL 28 (L) 06/17/2020 0123   CHOLHDL 4.4 06/17/2020 0123   VLDL 20 06/17/2020 0123   LDLCALC 75 06/17/2020 0123     Risk Assessment/Calculations:    CHA2DS2-VASc Score = 5  This indicates a 7.2% annual risk of stroke. The patient's score is based upon: CHF History: 1 HTN History:  1 Diabetes History: 1 Stroke History: 0 Vascular Disease History: 1 Age Score: 1 Gender Score: 0      Physical Exam:    VS:  BP 114/60 (BP Location: Left Arm, Patient Position: Sitting, Cuff Size: Normal)   Pulse 67   Ht 6' (1.829 m)   Wt 188 lb 6.4 oz (85.5 kg)   SpO2 96%   BMI 25.55 kg/m  Wt Readings from Last 3 Encounters:  02/18/21 188 lb 6.4 oz (85.5 kg)  11/22/20 170 lb (77.1 kg)  11/08/20 187 lb 3.2 oz (84.9 kg)     General: Alert, oriented x3, no distress, healthy ICD site. Head: no evidence of trauma, PERRL, EOMI, no exophtalmos or lid lag, no myxedema, no xanthelasma; normal ears, nose and oropharynx Neck: normal jugular venous pulsations and no hepatojugular reflux; brisk carotid pulses without delay and no carotid bruits Chest: Markedly diminished breath sounds and dullness to percussion in the left base, otherwise clear to auscultation  Cardiovascular: normal position and quality of the apical impulse, regular rhythm, normal first and second heart sounds, no murmurs, rubs or gallops Abdomen: Gastric feeding tube, colostomy, no tenderness or distention, no masses by palpation, no abnormal pulsatility or arterial bruits, normal bowel sounds, no hepatosplenomegaly Extremities: no clubbing, cyanosis or edema; 2+ radial, ulnar and brachial pulses bilaterally; 2+ right femoral, posterior tibial and dorsalis pedis pulses; 2+ left femoral, posterior tibial and dorsalis pedis pulses; no subclavian or femoral bruits Neurological: grossly nonfocal Psych: Normal mood and affect    ASSESSMENT:    1. PAF (paroxysmal atrial fibrillation) ([place])   2. Chronic HFrEF (heart failure with reduced ejection fraction) ([place])   3. Coronary artery disease involving native coronary artery of native heart without angina pectoris   4. ICD (implantable cardioverter-defibrillator) in place   5. VT (ventricular tachycardia)   6. Encounter for monitoring amiodarone therapy   7. Long term  (current) use of anticoagulants   8. CKD (chronic kidney disease) stage 4, GFR 15-29 ml/min (HCC)   9. Type 2 diabetes mellitus with complication, without long-term current use of insulin ([place])   10. Acquired hypothyroidism   11. Hypercholesterolemia   12. Preoperative cardiovascular examination   13. Pleural effusion on left     PLAN:    In order of problems listed above:  CHF: Probably euvolemic, may be slightly hypervolemic.  Continues to have physical findings consistent with chronic left pleural effusion, unchanged at least since January 2022 .  Appears to be tolerating Entresto, but I am not sure his blood pressure and renal function will allow further titration.  We will check renal parameters again today.  For the same reason I am not sure I would give him spironolactone.  Not a candidate for SGLT2 inhibitors with history of Fournier's gangrene, which was almost fatal. CAD: Denies angina pectoris.  Previously told that he was not a candidate for surgical revascularization and had a failed attempt at PCI to the LAD in 2017.  On statin.  Plan to recheck his lipid profile.  He did have breakfast this morning including eggs, his triglycerides may be elevated. ICD:  Although he has a CRT-D device in place, the LV port is not occupied by a lead.  Dr. Caryl Comes does not think that he would necessarily improve with CRT-D since he has ischemic cardiomyopathy and does not have a typical LBBB.  We should reevaluate that when they come close to timing of generator change out in about a year. VT: Had a VT storm in the setting of major electrolyte imbalances.  Remarkable improvement without even a single nonsustained event in the last several months, after starting amiodarone and keeping his potassium in normal range. PAFib: None has been detected since a year ago when he had sepsis.  Has previously had a cavotricuspid isthmus ablation.  Anticoagulation with Eliquis.  No history of stroke/TIA.   Amiodarone:  Good response.  Check liver function tests and thyroid function tests. Anticoagulation: Although he has had normal renal function, he is on full dose Eliquis since he is younger than age CKD 4: Creatinine has been stable around 2.0, in September was 2.33 DM: Pretty good control especially considering the seriousness of his recent medical problems.  Should not receive SGLT2 inhibitors due to his history of Fournier's gangrene. Recheck A1c. Hypothyroidism: has cold intolerance, otw no symptoms of hypothyroidism. On a rather low dose of levothyroxine. Check today. HLP:On statin, labs today Preop CV exam: He wants his G-tube removed. He is considering a colostomy takedown.  Considering the complexity of his previous perineal surgery, this may not be an easy surgery. He has achieved a remarkable degree of cardiovascular stability recently. I think he would tolerate surgery with moderate risk.  He will not require "bridging" anticoagulation, but it is important that he be fully compensated and euvolemic at the time of the procedure. Chronic left pleural effusion: unchanged by exam.         Medication Adjustments/Labs and Tests Ordered: Current medicines are reviewed at length with the patient today.  Concerns regarding medicines are outlined above.  Orders Placed This Encounter  Procedures   Brain natriuretic peptide   Comprehensive metabolic panel   TSH   Lipid panel   Hemoglobin A1c   CBC   Ambulatory referral to Nephrology   EKG 12-Lead    No orders of the defined types were placed in this encounter.   Patient Instructions  Medication Instructions:  No changes *If you need a refill on your cardiac medications before your next appointment, please call your pharmacy*   Lab Work: Your provider would like for you to have the following labs today: CBC, Lipid, A1C, BNP, CMET, TSH  If you have labs (blood work) drawn today and your tests are completely normal, you will receive your  results only by: [place] (if you have MyChart) OR A paper copy in the mail If you have any lab test that is abnormal or we need to change your treatment, we will call you to review the results.   Testing/Procedures: None ordered   Follow-Up: At [facility], you and your health needs are our priority.  As part of our continuing mission to provide you with exceptional heart care, we have created designated Provider Care Teams.  These Care Teams include your primary Cardiologist (physician) and Advanced Practice Providers (APPs -  Physician Assistants and Nurse Practitioners) who all work together to provide you with the care you need, when you need it.  We recommend signing up for the patient portal called "MyChart".  Sign up information is provided on this After Visit Summary.  MyChart is used to connect with patients for Virtual Visits (Telemedicine).  Patients are able to view lab/test results, encounter notes, upcoming appointments, etc.  Non-urgent messages can be sent to your provider as well.   To learn more about what you can do with MyChart, go to [email or web address].    Your next appointment:   6 month(s)  The format for your next appointment:   In Person  Provider:   Sanda Klein, MD     Other Instructions A referral has been placed to Nephrology  You may call [facility] at [phone]    SignedSanda Klein, MD  02/18/2021 4:41 PM    [place]

## 2021-02-19 LAB — LIPID PANEL
Chol/HDL Ratio: 3.6 ratio (ref 0.0–5.0)
Cholesterol, Total: 174 mg/dL (ref 100–199)
HDL: 49 mg/dL (ref >39)
LDL Chol Calc (NIH): 103 mg/dL — ABNORMAL HIGH (ref 0–99)
Triglycerides: 122 mg/dL (ref 0–149)
VLDL Cholesterol Cal: 22 mg/dL (ref 5–40)

## 2021-02-19 LAB — COMPREHENSIVE METABOLIC PANEL
ALT: 10 IU/L (ref 0–44)
AST: 14 IU/L (ref 0–40)
Albumin/Globulin Ratio: 1.7 (ref 1.2–2.2)
Albumin: 4 g/dL (ref 3.7–4.7)
Alkaline Phosphatase: 107 IU/L (ref 44–121)
BUN/Creatinine Ratio: 12 (ref 10–24)
BUN: 28 mg/dL — ABNORMAL HIGH (ref 8–27)
Bilirubin Total: <0.2 mg/dL (ref 0.0–1.2)
CO2: 23 mmol/L (ref 20–29)
Calcium: 9.3 mg/dL (ref 8.6–10.2)
Chloride: 105 mmol/L (ref 96–106)
Creatinine, Ser: 2.4 mg/dL — ABNORMAL HIGH (ref 0.76–1.27)
Globulin, Total: 2.4 g/dL (ref 1.5–4.5)
Glucose: 118 mg/dL — ABNORMAL HIGH (ref 70–99)
Potassium: 4.7 mmol/L (ref 3.5–5.2)
Sodium: 146 mmol/L — ABNORMAL HIGH (ref 134–144)
Total Protein: 6.4 g/dL (ref 6.0–8.5)
eGFR: 28 mL/min/{1.73_m2} — ABNORMAL LOW (ref >59)

## 2021-02-19 LAB — CBC
Hematocrit: 36.5 % — ABNORMAL LOW (ref 37.5–51.0)
Hemoglobin: 11.8 g/dL — ABNORMAL LOW (ref 13.0–17.7)
MCH: 28 pg (ref 26.6–33.0)
MCHC: 32.3 g/dL (ref 31.5–35.7)
MCV: 87 fL (ref 79–97)
Platelets: 304 10*3/uL (ref 150–450)
RBC: 4.21 x10E6/uL (ref 4.14–5.80)
RDW: 15.5 % — ABNORMAL HIGH (ref 11.6–15.4)
WBC: 8.1 10*3/uL (ref 3.4–10.8)

## 2021-02-19 LAB — HEMOGLOBIN A1C
Est. average glucose Bld gHb Est-mCnc: 131 mg/dL
Hgb A1c MFr Bld: 6.2 % — ABNORMAL HIGH (ref 4.8–5.6)

## 2021-02-19 LAB — TSH: TSH: 29 u[IU]/mL — ABNORMAL HIGH (ref 0.450–4.500)

## 2021-02-19 LAB — BRAIN NATRIURETIC PEPTIDE: BNP: 947.5 pg/mL — ABNORMAL HIGH (ref 0.0–100.0)

## 2021-02-20 ENCOUNTER — Ambulatory Visit (INDEPENDENT_AMBULATORY_CARE_PROVIDER_SITE_OTHER): Payer: Medicare Other

## 2021-02-20 DIAGNOSIS — I472 Ventricular tachycardia, unspecified: Secondary | ICD-10-CM

## 2021-02-20 LAB — CUP PACEART REMOTE DEVICE CHECK
Battery Remaining Longevity: 11 mo
Battery Voltage: 2.93 V
Brady Statistic AP VP Percent: 0.06 %
Brady Statistic AP VS Percent: 98.31 %
Brady Statistic AS VP Percent: 0 %
Brady Statistic AS VS Percent: 1.63 %
Brady Statistic RA Percent Paced: 98.14 %
Brady Statistic RV Percent Paced: 0.09 %
Date Time Interrogation Session: 20221130033423
HighPow Impedance: 31 Ohm
HighPow Impedance: 39 Ohm
Implantable Lead Implant Date: 20200423
Implantable Lead Implant Date: 20200423
Implantable Lead Location: 753859
Implantable Lead Location: 753860
Implantable Lead Model: 5076
Implantable Lead Model: 6949
Implantable Pulse Generator Implant Date: 20200423
Lead Channel Impedance Value: 209 Ohm
Lead Channel Impedance Value: 342 Ohm
Lead Channel Impedance Value: 380 Ohm
Lead Channel Impedance Value: 4047 Ohm
Lead Channel Impedance Value: 4047 Ohm
Lead Channel Impedance Value: 4047 Ohm
Lead Channel Impedance Value: 4047 Ohm
Lead Channel Impedance Value: 4047 Ohm
Lead Channel Impedance Value: 4047 Ohm
Lead Channel Impedance Value: 4047 Ohm
Lead Channel Impedance Value: 4047 Ohm
Lead Channel Impedance Value: 4047 Ohm
Lead Channel Impedance Value: 4047 Ohm
Lead Channel Impedance Value: 4047 Ohm
Lead Channel Impedance Value: 4047 Ohm
Lead Channel Impedance Value: 4047 Ohm
Lead Channel Impedance Value: 4047 Ohm
Lead Channel Impedance Value: 4047 Ohm
Lead Channel Pacing Threshold Amplitude: 0.75 V
Lead Channel Pacing Threshold Amplitude: 0.875 V
Lead Channel Pacing Threshold Pulse Width: 0.4 ms
Lead Channel Pacing Threshold Pulse Width: 0.4 ms
Lead Channel Sensing Intrinsic Amplitude: 1 mV
Lead Channel Sensing Intrinsic Amplitude: 1 mV
Lead Channel Sensing Intrinsic Amplitude: 5.875 mV
Lead Channel Sensing Intrinsic Amplitude: 5.875 mV
Lead Channel Setting Pacing Amplitude: 2 V
Lead Channel Setting Pacing Amplitude: 2 V
Lead Channel Setting Pacing Pulse Width: 0.6 ms
Lead Channel Setting Sensing Sensitivity: 0.9 mV

## 2021-02-22 ENCOUNTER — Encounter: Payer: Self-pay | Admitting: *Deleted

## 2021-02-25 ENCOUNTER — Other Ambulatory Visit: Payer: Self-pay | Admitting: *Deleted

## 2021-02-25 DIAGNOSIS — E039 Hypothyroidism, unspecified: Secondary | ICD-10-CM

## 2021-02-25 DIAGNOSIS — E785 Hyperlipidemia, unspecified: Secondary | ICD-10-CM

## 2021-02-25 DIAGNOSIS — Z931 Gastrostomy status: Secondary | ICD-10-CM

## 2021-02-25 DIAGNOSIS — I2510 Atherosclerotic heart disease of native coronary artery without angina pectoris: Secondary | ICD-10-CM

## 2021-02-25 MED ORDER — LEVOTHYROXINE SODIUM 50 MCG PO TABS: 50.0000 ug | ORAL_TABLET | Freq: Every day | ORAL | 11 refills | Status: DC

## 2021-02-25 MED ORDER — ATORVASTATIN CALCIUM 80 MG PO TABS: 80.0000 mg | ORAL_TABLET | Freq: Every day | ORAL | 3 refills | Status: AC

## 2021-03-01 ENCOUNTER — Other Ambulatory Visit: Payer: Self-pay | Admitting: Cardiovascular Disease

## 2021-03-01 NOTE — Progress Notes (Signed)
Remote ICD transmission.   

## 2021-03-02 ENCOUNTER — Other Ambulatory Visit: Payer: Self-pay | Admitting: Cardiovascular Disease

## 2021-03-28 ENCOUNTER — Other Ambulatory Visit: Payer: Self-pay | Admitting: Cardiovascular Disease

## 2021-03-28 DIAGNOSIS — I5022 Chronic systolic (congestive) heart failure: Secondary | ICD-10-CM

## 2021-04-24 ENCOUNTER — Other Ambulatory Visit: Payer: Self-pay

## 2021-05-04 ENCOUNTER — Other Ambulatory Visit: Payer: Self-pay | Admitting: Physician Assistant

## 2021-05-12 ENCOUNTER — Other Ambulatory Visit: Payer: Self-pay | Admitting: Cardiovascular Disease

## 2021-05-12 DIAGNOSIS — I5022 Chronic systolic (congestive) heart failure: Secondary | ICD-10-CM

## 2021-05-22 ENCOUNTER — Ambulatory Visit (INDEPENDENT_AMBULATORY_CARE_PROVIDER_SITE_OTHER): Payer: Medicare Other

## 2021-05-22 DIAGNOSIS — I472 Ventricular tachycardia, unspecified: Secondary | ICD-10-CM | POA: Diagnosis not present

## 2021-05-22 LAB — CUP PACEART REMOTE DEVICE CHECK
Battery Remaining Longevity: 9 mo
Battery Voltage: 2.94 V
Brady Statistic AP VP Percent: 0.04 %
Brady Statistic AP VS Percent: 97.11 %
Brady Statistic AS VP Percent: 0 %
Brady Statistic AS VS Percent: 2.85 %
Brady Statistic RA Percent Paced: 96.92 %
Brady Statistic RV Percent Paced: 0.06 %
Date Time Interrogation Session: 20230301012402
HighPow Impedance: 34 Ohm
HighPow Impedance: 46 Ohm
Implantable Lead Implant Date: 20200423
Implantable Lead Implant Date: 20200423
Implantable Lead Location: 753859
Implantable Lead Location: 753860
Implantable Lead Model: 5076
Implantable Lead Model: 6949
Implantable Pulse Generator Implant Date: 20200423
Lead Channel Impedance Value: 266 Ohm
Lead Channel Impedance Value: 342 Ohm
Lead Channel Impedance Value: 399 Ohm
Lead Channel Impedance Value: 4047 Ohm
Lead Channel Impedance Value: 4047 Ohm
Lead Channel Impedance Value: 4047 Ohm
Lead Channel Impedance Value: 4047 Ohm
Lead Channel Impedance Value: 4047 Ohm
Lead Channel Impedance Value: 4047 Ohm
Lead Channel Impedance Value: 4047 Ohm
Lead Channel Impedance Value: 4047 Ohm
Lead Channel Impedance Value: 4047 Ohm
Lead Channel Impedance Value: 4047 Ohm
Lead Channel Impedance Value: 4047 Ohm
Lead Channel Impedance Value: 4047 Ohm
Lead Channel Impedance Value: 4047 Ohm
Lead Channel Impedance Value: 4047 Ohm
Lead Channel Impedance Value: 4047 Ohm
Lead Channel Pacing Threshold Amplitude: 0.75 V
Lead Channel Pacing Threshold Amplitude: 1 V
Lead Channel Pacing Threshold Pulse Width: 0.4 ms
Lead Channel Pacing Threshold Pulse Width: 0.4 ms
Lead Channel Sensing Intrinsic Amplitude: 1 mV
Lead Channel Sensing Intrinsic Amplitude: 1 mV
Lead Channel Sensing Intrinsic Amplitude: 5.875 mV
Lead Channel Sensing Intrinsic Amplitude: 5.875 mV
Lead Channel Setting Pacing Amplitude: 2 V
Lead Channel Setting Pacing Amplitude: 2 V
Lead Channel Setting Pacing Pulse Width: 0.6 ms
Lead Channel Setting Sensing Sensitivity: 0.9 mV

## 2021-05-29 NOTE — Progress Notes (Signed)
Remote ICD transmission.   

## 2021-06-21 ENCOUNTER — Other Ambulatory Visit: Payer: Self-pay | Admitting: Cardiovascular Disease

## 2021-06-21 NOTE — Telephone Encounter (Signed)
Prescription refill request for Eliquis received. ?Indication:Afib ?Last office visit:11/22 ?Scr:2.4 ?Age: 76 ?Weight:85.5 kg ? ?Prescription refilled ? ?

## 2021-08-02 ENCOUNTER — Other Ambulatory Visit: Payer: Self-pay | Admitting: Cardiovascular Disease

## 2021-08-12 ENCOUNTER — Other Ambulatory Visit: Payer: Self-pay | Admitting: Cardiovascular Disease

## 2021-08-12 DIAGNOSIS — I5022 Chronic systolic (congestive) heart failure: Secondary | ICD-10-CM

## 2021-08-21 ENCOUNTER — Ambulatory Visit (INDEPENDENT_AMBULATORY_CARE_PROVIDER_SITE_OTHER): Payer: Medicare Other

## 2021-08-21 DIAGNOSIS — I472 Ventricular tachycardia, unspecified: Secondary | ICD-10-CM | POA: Diagnosis not present

## 2021-08-22 ENCOUNTER — Telehealth: Payer: Self-pay

## 2021-08-22 LAB — CUP PACEART REMOTE DEVICE CHECK
Battery Remaining Longevity: 7 mo
Battery Voltage: 2.93 V
Brady Statistic AP VP Percent: 0.03 %
Brady Statistic AP VS Percent: 96.28 %
Brady Statistic AS VP Percent: 0 %
Brady Statistic AS VS Percent: 3.69 %
Brady Statistic RA Percent Paced: 96.29 %
Brady Statistic RV Percent Paced: 0.03 %
Date Time Interrogation Session: 20230531022603
HighPow Impedance: 34 Ohm
HighPow Impedance: 46 Ohm
Implantable Lead Implant Date: 20200423
Implantable Lead Implant Date: 20200423
Implantable Lead Location: 753859
Implantable Lead Location: 753860
Implantable Lead Model: 5076
Implantable Lead Model: 6949
Implantable Pulse Generator Implant Date: 20200423
Lead Channel Impedance Value: 266 Ohm
Lead Channel Impedance Value: 342 Ohm
Lead Channel Impedance Value: 399 Ohm
Lead Channel Impedance Value: 4047 Ohm
Lead Channel Impedance Value: 4047 Ohm
Lead Channel Impedance Value: 4047 Ohm
Lead Channel Impedance Value: 4047 Ohm
Lead Channel Impedance Value: 4047 Ohm
Lead Channel Impedance Value: 4047 Ohm
Lead Channel Impedance Value: 4047 Ohm
Lead Channel Impedance Value: 4047 Ohm
Lead Channel Impedance Value: 4047 Ohm
Lead Channel Impedance Value: 4047 Ohm
Lead Channel Impedance Value: 4047 Ohm
Lead Channel Impedance Value: 4047 Ohm
Lead Channel Impedance Value: 4047 Ohm
Lead Channel Impedance Value: 4047 Ohm
Lead Channel Impedance Value: 4047 Ohm
Lead Channel Pacing Threshold Amplitude: 0.75 V
Lead Channel Pacing Threshold Amplitude: 1 V
Lead Channel Pacing Threshold Pulse Width: 0.4 ms
Lead Channel Pacing Threshold Pulse Width: 0.4 ms
Lead Channel Sensing Intrinsic Amplitude: 1 mV
Lead Channel Sensing Intrinsic Amplitude: 1 mV
Lead Channel Sensing Intrinsic Amplitude: 6.375 mV
Lead Channel Sensing Intrinsic Amplitude: 6.375 mV
Lead Channel Setting Pacing Amplitude: 2 V
Lead Channel Setting Pacing Amplitude: 2 V
Lead Channel Setting Pacing Pulse Width: 0.6 ms
Lead Channel Setting Sensing Sensitivity: 0.9 mV

## 2021-08-22 NOTE — Telephone Encounter (Signed)
Scheduled remote reviewed. Normal device function.   The device estimates 7 months until ERI, sent to triage to start IFU  Outreach made to Pt.  Pt aware.  Monthly remotes scheduled.  Follow up appt with Dr. Loletha Grayer scheduled.

## 2021-09-04 NOTE — Progress Notes (Signed)
Remote ICD transmission.   

## 2021-09-12 ENCOUNTER — Telehealth: Payer: Self-pay | Admitting: Cardiovascular Disease

## 2021-09-12 NOTE — Telephone Encounter (Signed)
Faxed two most current EKGs to [facility] ([facility]) [phone].Marland Kitchen

## 2021-09-12 NOTE — Telephone Encounter (Signed)
  Charles Mcdonald - [facility] ED requesting to send recent EKG, they cant see the image through care everywhere.  She gave fax# [phone]  ATTN: Joellen Jersey

## 2021-09-23 ENCOUNTER — Ambulatory Visit (INDEPENDENT_AMBULATORY_CARE_PROVIDER_SITE_OTHER): Payer: Medicare Other

## 2021-09-23 DIAGNOSIS — I472 Ventricular tachycardia, unspecified: Secondary | ICD-10-CM

## 2021-09-23 LAB — CUP PACEART REMOTE DEVICE CHECK
Battery Remaining Longevity: 8 mo
Battery Voltage: 2.9 V
Brady Statistic AP VP Percent: 1.07 %
Brady Statistic AP VS Percent: 63.74 %
Brady Statistic AS VP Percent: 0.01 %
Brady Statistic AS VS Percent: 35.18 %
Brady Statistic RA Percent Paced: 64.82 %
Brady Statistic RV Percent Paced: 1.06 %
Date Time Interrogation Session: 20230703022724
HighPow Impedance: 34 Ohm
HighPow Impedance: 45 Ohm
Implantable Lead Implant Date: 20200423
Implantable Lead Implant Date: 20200423
Implantable Lead Location: 753859
Implantable Lead Location: 753860
Implantable Lead Model: 5076
Implantable Lead Model: 6949
Implantable Pulse Generator Implant Date: 20200423
Lead Channel Impedance Value: 247 Ohm
Lead Channel Impedance Value: 323 Ohm
Lead Channel Impedance Value: 399 Ohm
Lead Channel Impedance Value: 4047 Ohm
Lead Channel Impedance Value: 4047 Ohm
Lead Channel Impedance Value: 4047 Ohm
Lead Channel Impedance Value: 4047 Ohm
Lead Channel Impedance Value: 4047 Ohm
Lead Channel Impedance Value: 4047 Ohm
Lead Channel Impedance Value: 4047 Ohm
Lead Channel Impedance Value: 4047 Ohm
Lead Channel Impedance Value: 4047 Ohm
Lead Channel Impedance Value: 4047 Ohm
Lead Channel Impedance Value: 4047 Ohm
Lead Channel Impedance Value: 4047 Ohm
Lead Channel Impedance Value: 4047 Ohm
Lead Channel Impedance Value: 4047 Ohm
Lead Channel Impedance Value: 4047 Ohm
Lead Channel Pacing Threshold Amplitude: 0.875 V
Lead Channel Pacing Threshold Amplitude: 1 V
Lead Channel Pacing Threshold Pulse Width: 0.4 ms
Lead Channel Pacing Threshold Pulse Width: 0.4 ms
Lead Channel Sensing Intrinsic Amplitude: 0.375 mV
Lead Channel Sensing Intrinsic Amplitude: 0.375 mV
Lead Channel Sensing Intrinsic Amplitude: 6 mV
Lead Channel Sensing Intrinsic Amplitude: 6 mV
Lead Channel Setting Pacing Amplitude: 2 V
Lead Channel Setting Pacing Amplitude: 2 V
Lead Channel Setting Pacing Pulse Width: 0.6 ms
Lead Channel Setting Sensing Sensitivity: 0.9 mV

## 2021-09-26 ENCOUNTER — Other Ambulatory Visit: Payer: Self-pay | Admitting: Internal Medicine

## 2021-09-30 ENCOUNTER — Ambulatory Visit (INDEPENDENT_AMBULATORY_CARE_PROVIDER_SITE_OTHER): Payer: Medicare Other | Admitting: Cardiovascular Disease

## 2021-09-30 ENCOUNTER — Encounter: Payer: Self-pay | Admitting: Cardiovascular Disease

## 2021-09-30 VITALS — BP 104/58 | HR 63 | Ht 72.0 in | Wt 184.6 lb

## 2021-09-30 DIAGNOSIS — I5022 Chronic systolic (congestive) heart failure: Secondary | ICD-10-CM

## 2021-09-30 DIAGNOSIS — Z5181 Encounter for therapeutic drug level monitoring: Secondary | ICD-10-CM

## 2021-09-30 DIAGNOSIS — E039 Hypothyroidism, unspecified: Secondary | ICD-10-CM

## 2021-09-30 DIAGNOSIS — Z79899 Other long term (current) drug therapy: Secondary | ICD-10-CM | POA: Diagnosis not present

## 2021-09-30 DIAGNOSIS — I480 Paroxysmal atrial fibrillation: Secondary | ICD-10-CM

## 2021-09-30 DIAGNOSIS — I472 Ventricular tachycardia, unspecified: Secondary | ICD-10-CM

## 2021-09-30 DIAGNOSIS — Z95810 Presence of automatic (implantable) cardiac defibrillator: Secondary | ICD-10-CM | POA: Diagnosis not present

## 2021-09-30 DIAGNOSIS — E7800 Pure hypercholesterolemia, unspecified: Secondary | ICD-10-CM

## 2021-09-30 DIAGNOSIS — N184 Chronic kidney disease, stage 4 (severe): Secondary | ICD-10-CM

## 2021-09-30 DIAGNOSIS — I2510 Atherosclerotic heart disease of native coronary artery without angina pectoris: Secondary | ICD-10-CM | POA: Diagnosis not present

## 2021-09-30 DIAGNOSIS — J90 Pleural effusion, not elsewhere classified: Secondary | ICD-10-CM

## 2021-09-30 DIAGNOSIS — E118 Type 2 diabetes mellitus with unspecified complications: Secondary | ICD-10-CM

## 2021-09-30 DIAGNOSIS — D6869 Other thrombophilia: Secondary | ICD-10-CM

## 2021-09-30 LAB — LIPID PANEL
Chol/HDL Ratio: 3 ratio (ref 0.0–5.0)
Cholesterol, Total: 180 mg/dL (ref 100–199)
HDL: 60 mg/dL (ref >39)
LDL Chol Calc (NIH): 102 mg/dL — ABNORMAL HIGH (ref 0–99)
Triglycerides: 102 mg/dL (ref 0–149)
VLDL Cholesterol Cal: 18 mg/dL (ref 5–40)

## 2021-09-30 LAB — BASIC METABOLIC PANEL
BUN/Creatinine Ratio: 12 (ref 10–24)
BUN: 23 mg/dL (ref 8–27)
CO2: 28 mmol/L (ref 20–29)
Calcium: 9.5 mg/dL (ref 8.6–10.2)
Chloride: 101 mmol/L (ref 96–106)
Creatinine, Ser: 1.94 mg/dL — ABNORMAL HIGH (ref 0.76–1.27)
Glucose: 131 mg/dL — ABNORMAL HIGH (ref 70–99)
Potassium: 4.6 mmol/L (ref 3.5–5.2)
Sodium: 142 mmol/L (ref 134–144)
eGFR: 36 mL/min/{1.73_m2} — ABNORMAL LOW (ref >59)

## 2021-09-30 LAB — TSH: TSH: 26.4 u[IU]/mL — ABNORMAL HIGH (ref 0.450–4.500)

## 2021-09-30 NOTE — Patient Instructions (Signed)
Medication Instructions:  No changes *If you need a refill on your cardiac medications before your next appointment, please call your pharmacy*   Lab Work: Your provider would like for you to have the following labs today: Lipid and TSH  If you have labs (blood work) drawn today and your tests are completely normal, you will receive your results only by: [place] (if you have MyChart) OR A paper copy in the mail If you have any lab test that is abnormal or we need to change your treatment, we will call you to review the results.   Testing/Procedures: No changes   Follow-Up: At [facility], you and your health needs are our priority.  As part of our continuing mission to provide you with exceptional heart care, we have created designated Provider Care Teams.  These Care Teams include your primary Cardiologist (physician) and Advanced Practice Providers (APPs -  Physician Assistants and Nurse Practitioners) who all work together to provide you with the care you need, when you need it.  We recommend signing up for the patient portal called "MyChart".  Sign up information is provided on this After Visit Summary.  MyChart is used to connect with patients for Virtual Visits (Telemedicine).  Patients are able to view lab/test results, encounter notes, upcoming appointments, etc.  Non-urgent messages can be sent to your provider as well.   To learn more about what you can do with MyChart, go to [email or web address].    Your next appointment:   5 month(s)  The format for your next appointment:   In Person  Provider:   Sanda Klein, MD {   Important Information About Sugar

## 2021-09-30 NOTE — Progress Notes (Signed)
Cardiology Office Note:    Date:  10/02/2021   ID:  [name], DOB [birth year], MRN [id number]  PCP:  Joline Salt, PA-C   [facility] Providers Cardiologist:  Sanda Klein, MD     Referring MD: Joline Salt, PA-C   Chief Complaint  Patient presents with   Coronary Artery Disease   Congestive Heart Failure   icd check     History of Present Illness:    Charles Mcdonald is a 76 y.o. 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  He had a protracted illness with septic shock in November 2021 during which his defibrillator delivered several shocks.  He required reconstructive surgery in January and had more shocks from his defibrillator around that time.  He was discharged from long-term acute care earlier this year and presented with ventricular tachycardia storm and more than 100 consecutive defibrillator shocks in the setting of severe hypokalemia.  He was subsequently hospitalized again for acute heart failure exacerbation when his diuretics were discontinued .  10/19/2020 he presented with abdominal pain and was diagnosed with cecal volvulus.  He was told that he cannot be helped and his defibrillator was turned off, but he returned to  our office 3-4 weeks later, he feels that he has improved with resolution of all his abdominal complaints and good functional status.  We turned the ICD therapies back on.  Since his last appointment he did well until late June 2023 when he was admitted with acute respiratory failure in [facility] (reviewed records of clinical notes, labs, imaging studies, echocardiogram, etc. in care everywhere).  It was initially unclear whether he had heart failure or pneumonia.  Lactic acid level was high on admission as was his WBC count.  Respiratory virus panel and COVID testing were negative.  No bacterial cultures grew.  I think the final diagnosis was that he had multifocal pneumonia and he improved after treatment with antibiotics.  He had a left thoracentesis during that hospitalization.  The chest tube dislodged before full drainage of the fluid.  He feels that he has recovered fully.  He denies orthopnea and PND.  He is not coughing and has not had fever or chills.  Does not have lower extremity edema, claudication, palpitations, dizziness or syncope or any focal neurological complaints.  Had his G-tube removed and is eating well.  His weight is stable around 195 pounds.  Spoke to surgery about getting his colostomy taken down, but the risk was felt to be too high and he feels satisfied living with a colostomy right now.  During his recent hospital stay labs included a creatinine of 1.89, hemoglobin 12.3, normal liver function test, hemoglobin A1c 6.2%, LDL cholesterol 103.  Defibrillator interrogation shows normal device function.  The device has not recorded any episodes of high ventricular rates either sustained or nonsustained and he has not had any atrial fibrillation.  His  OptiVol has been in normal range, no sign of fluid accumulation in his chest even during his recent hospitalization with respiratory failure, supporting the idea that this was pneumonia not heart failure.  His battery took a big hip  when he had VT storm and he only has about 8 months left on this generator.  He has 78% atrial pacing and 0.6% ventricular pacing.  Notes that although he has a biventricular device, the LV port is plugged since attempts at placing the coronary sinus lead were unsuccessful.  He has a very broad QRS at 178 ms (atrial paced, ventricular sensed rhythm today).  However the IVCD pattern is that of an atypical left bundle branch block with Q waves in leads I, aVL and V6.    He saw Dr. Caryl Comes on 08/17/2020.  He commented that he is probably not a great CRT-D candidate since his IVCD is not a typical left bundle branch block. His VT in March 2022 was monomorphic with a cycle length around 240 ms and generally responded to ATP, but also led to multiple shocks. ATP during charging was turned on, although at the active cycle length conventional ATP would not be operational.  Hopefully with amiodarone therapy the VT cycle length will lengthen and ATP will be more frequently successful.   Past Medical History:  Diagnosis Date   Acute on chronic respiratory failure with hypoxia (HCC)    Atrial fibrillation, chronic (HCC)    Benign hypertension    CAD (coronary artery disease), native coronary artery 06/19/2020   Chronic HFrEF (heart failure with reduced ejection fraction) (HCC)    Chronic kidney disease, stage III (moderate) (HCC)    Diabetes ([place])    Gout    Hyperlipidemia     Past Surgical History:  Procedure Laterality Date   IR THORACENTESIS ASP PLEURAL SPACE W/IMG GUIDE  04/17/2020   SKIN SPLIT GRAFT Bilateral 03/27/2020   Procedure: SKIN GRAFT SPLIT THICKNESS;  Surgeon: Cindra Presume, MD;  Location: [facility];  Service: Plastics;  Laterality: Bilateral;   WOUND EXPLORATION Bilateral 03/27/2020   Procedure: Exploration bilateral groin wounds with partial closure;  Surgeon: Cindra Presume, MD;  Location: [facility];  Service: Plastics;  Laterality: Bilateral;    Current Medications: Current Meds   Medication Sig   amiodarone (PACERONE) 200 MG tablet TAKE 1 TABLET BY MOUTH ONCE DAILY.   atorvastatin (LIPITOR) 80 MG tablet Take 1 tablet (80 mg total) by mouth at bedtime.   ELIQUIS 5 MG TABS tablet TAKE 1 TABLET BY MOUTH EVERY 12 HOURS   furosemide (LASIX) 40 MG tablet Take 1 tablet (40 mg total) by mouth daily.   glucose blood test strip Accu-Chek Aviva Plus test strips   HYDROcodone-acetaminophen (NORCO/VICODIN) 5-325 MG tablet Take 1 tablet by mouth every 6 (six) hours as needed.   insulin lispro (HUMALOG) 100 UNIT/ML KwikPen Inject 5 Units into the skin 2 (two) times daily before a meal.   LANTUS SOLOSTAR 100 UNIT/ML Solostar Pen Inject 5-10 Units into the skin See admin instructions. Inject 10 units into the skin before breakfast and 5 units at bedtime   levothyroxine (SYNTHROID) 50 MCG tablet Take 1 tablet (50 mcg total) by mouth daily before breakfast.   metoprolol succinate (TOPROL-XL) 25 MG 24 hr tablet TAKE 1 BY MOUTH ONCE DAILY.   nitroGLYCERIN (NITROSTAT) 0.4 MG SL tablet Place 1 tablet (0.4 mg total) under the tongue every 5 (five) minutes x 3 doses as needed for chest pain.   pantoprazole (  PROTONIX) 40 MG tablet Take 40 mg by mouth at bedtime.   potassium chloride 20 MEQ/15ML (10%) SOLN Take 20 mEq by mouth.   sacubitril-valsartan (ENTRESTO) 49-51 MG Take 1 tablet by mouth 2 (two) times daily. PLEASE SCHEDULE APPOINTMENT WITH DR Sallyanne Kuster FOR FUTURE REFILLS     Allergies:   Wilder Glade [dapagliflozin] and Jardiance [empagliflozin]   Social History   Socioeconomic History   Marital status: Married    Spouse name: Dwan Bolt. Bogert   Number of children: 7   Years of education: Not on file   Highest education level: Some college, no degree  Occupational History   Occupation: Retried  Tobacco Use   Smoking status: Former    Packs/day: 0.20    Years: 55.00    Total pack years: 11.00    Types: Cigarettes    Quit date: 2012    Years since quitting: 11.5   Smokeless tobacco:  Never  Vaping Use   Vaping Use: Never used  Substance and Sexual Activity   Alcohol use: Never   Drug use: Never   Sexual activity: Not Currently  Other Topics Concern   Not on file  Social History Narrative   Not on file   Social Determinants of Health   Financial Resource Strain: High Risk (07/12/2020)   Overall Financial Resource Strain (CARDIA)    Difficulty of Paying Living Expenses: Hard  Food Insecurity: Food Insecurity Present (07/12/2020)   Hunger Vital Sign    Worried About Running Out of Food in the Last Year: Sometimes true    Ran Out of Food in the Last Year: Sometimes true  Transportation Needs: No Transportation Needs (07/12/2020)   PRAPARE - Hydrologist (Medical): No    Lack of Transportation (Non-Medical): No  Physical Activity: Not on file  Stress: Not on file  Social Connections: Not on file     Family History: The patient's family history includes Diabetes in his sister; Heart disease in his father.  ROS:   Please see the history of present illness.     All other systems reviewed and are negative.  EKGs/Labs/Other Studies Reviewed:    The following studies were reviewed today:   Echocardiogram 09/12/2021 [place]  The left ventricle is not well visualized.  Left ventricular systolic function is severely reduced.  IVC size was normal.  There is small size pericardial effusion.  There is a pleural effusion present.    Echocardiogram 07/09/2020      1. Left ventricular ejection fraction, by estimation, is 20 to 25%. The  left ventricle has severely decreased function. The left ventricle  demonstrates regional wall motion abnormalities.            There is akinesis of the entire anteroseptal, anterior LV walls and  apex. There is severe hypokinesis of all inferoseptal and inferior LV  walls. The inferolateral wall is moderately hypokinetic. The left  ventricular internal cavity size was  moderately dilated. Left  ventricular diastolic parameters are consistent  with Grade II diastolic dysfunction (pseudonormalization).   2. Right ventricular systolic function is mildly reduced. The right  ventricular size is normal. There is moderately elevated pulmonary artery  systolic pressure. The estimated right ventricular systolic pressure is  28.3 mmHg.   3. Left atrial size was moderately dilated.   4. The mitral valve is normal in structure. Trivial mitral valve  regurgitation. No evidence of mitral stenosis.   5. The aortic valve is tricuspid. Aortic valve regurgitation  is not  visualized. No aortic stenosis is present.   6. The inferior vena cava is normal in size with <50% respiratory  variability, suggesting right atrial pressure of 8 mmHg.     EKG:  EKG is ordered today and shows atrial paced, ventricular sensed rhythm with an atypical left bundle branch block (Q waves in the lateral leads), QRS 178 ms, QTc 480 ms)  Recent Labs: 02/18/2021: ALT 10; BNP 947.5; Hemoglobin 11.8; Platelets 304 09/30/2021: BUN 23; Creatinine, Ser 1.94; Potassium 4.6; Sodium 142; TSH 26.400  Labs from [facility] 09/12/2021  Lactate 2.2, WBC 12.1, hemoglobin 12.3  09/16/2021 WBC down to 6.6, normal liver function tests, potassium 3.4, creatinine 1.89  Recent Lipid Panel    Component Value Date/Time   CHOL 180 09/30/2021 1055   TRIG 102 09/30/2021 1055   HDL 60 09/30/2021 1055   CHOLHDL 3.0 09/30/2021 1055   CHOLHDL 4.4 06/17/2020 0123   VLDL 20 06/17/2020 0123   LDLCALC 102 (H) 09/30/2021 1055   November 2022 hemoglobin A1c 6.2%  Risk Assessment/Calculations:    CHA2DS2-VASc Score = 5 (Age, CHF, CAD, HTN, DM)        Physical Exam:    VS:  BP (!) 104/58 (BP Location: Left Arm, Patient Position: Sitting, Cuff Size: Large)   Pulse 63   Ht 6' (1.829 m)   Wt 184 lb 9.6 oz (83.7 kg)   SpO2 94%   BMI 25.04 kg/m     Wt Readings from Last 3 Encounters:  09/30/21 184 lb 9.6 oz (83.7 kg)  02/18/21 188  lb 6.4 oz (85.5 kg)  11/22/20 170 lb (77.1 kg)     General: Alert, oriented x3, no distress, healthy subclavian defibrillator site. Head: no evidence of trauma, PERRL, EOMI, no exophtalmos or lid lag, no myxedema, no xanthelasma; normal ears, nose and oropharynx Neck: normal jugular venous pulsations and no hepatojugular reflux; brisk carotid pulses without delay and no carotid bruits Chest: Diminished breath sounds noted to percussion in the bottom half of the left lung Cardiovascular: normal position and quality of the apical impulse, regular rhythm, normal first and second heart sounds, no murmurs, rubs or gallops Abdomen: no tenderness or distention, no masses by palpation, no abnormal pulsatility or arterial bruits, normal bowel sounds, no hepatosplenomegaly Extremities: no clubbing, cyanosis or edema; 2+ radial, ulnar and brachial pulses bilaterally; 2+ right femoral, posterior tibial and dorsalis pedis pulses; 2+ left femoral, posterior tibial and dorsalis pedis pulses; no subclavian or femoral bruits Neurological: grossly nonfocal Psych: Normal mood and affect     ASSESSMENT:    1. Chronic HFrEF (heart failure with reduced ejection fraction) ([place])   2. Coronary artery disease involving native coronary artery of native heart without angina pectoris   3. Hypercholesterolemia   4. ICD (implantable cardioverter-defibrillator) in place   5. VT (ventricular tachycardia) ([place])   6. PAF (paroxysmal atrial fibrillation) ([place])   7. Encounter for monitoring amiodarone therapy   8. Acquired thrombophilia ([place])   9. CKD (chronic kidney disease) stage 4, GFR 15-29 ml/min (HCC)   10. Type 2 diabetes mellitus with complication, without long-term current use of insulin ([place])   11. Acquired hypothyroidism   12. Pleural effusion on left      PLAN:    In order of problems listed above:  CHF: Appears clinically euvolemic, but on physical exam still has a sizable left pleural effusion.  This  has actually been present at least since January 2022.  Optivol/thoracic impedance via his  device suggests that he has been euvolemic for the last 3 months.  Blood pressure unlikely to tolerate any further titration of heart failure medications.  Not a candidate for SGLT2 inhibitors with history of Fournier's gangrene, which was almost fatal.  Continue current diuretic prescription.  Recheck potassium level today. CAD: Asymptomatic/no angina pectoris.  Previously told that he was not a candidate for surgical revascularization and had a failed attempt at PCI to the LAD in 2017.   HLP: Despite statin therapy, LDL cholesterol still too high at 103.  We subsequently increase his atorvastatin dose to 80 mg daily.  Recheck today. ICD:  Although he has a CRT-D device in place, the LV port is not occupied by a lead.  Dr. Caryl Comes does not think that he would necessarily improve with CRT-D since he has ischemic cardiomyopathy and does not have a typical LBBB.  We should reevaluate that when they come close to timing of generator change out in about a year.  We will bring him back for another appointment in late 2023. VT: Had a VT storm in the setting of major electrolyte imbalances.  Remarkable improvement without even a single nonsustained event in the last several months, after starting amiodarone and correcting electrolytes.  Important to keep electrolytes in normal range.  Keep potassium 4.0 or higher, recheck today. PAFib: None has been detected since a year ago when he had sepsis.  Has previously had a cavotricuspid isthmus ablation.  Anticoagulation with Eliquis.  No history of stroke/TIA.  CHA2DS2-VASc score 5. Amiodarone: Good response.  Check liver and thyroid function tests every 6 months (had normal liver tests during his hospitalization for respiratory failure last month). Anticoagulation: Although he has abnormal renal function, he is on full dose Eliquis since he is younger than 76. CKD 4: We estimated  his previous baseline creatinine to be around 2.0.  Most recent value 1.89 at discharge from [facility] last month. DM: Good control, most recent hemoglobin A1c 6.2%.  Should not receive SGLT2 inhibitors due to his history of Fournier's gangrene.  Hypothyroidism: Recheck TSH today.  Suspect he needs a higher dose of levothyroxine, increase the dose gradually to avoid precipitating cardiac arrhythmia. Chronic left pleural effusion: unchanged by exam.  This was tapped when he was in [facility].  I can only find status for cell count which showed a lymphocytic dominant pattern.  Does not appear to be empyema.  I cannot find records of any albumin/protein/LDH assays.         Medication Adjustments/Labs and Tests Ordered: Current medicines are reviewed at length with the patient today.  Concerns regarding medicines are outlined above.  Orders Placed This Encounter  Procedures   Lipid panel   TSH   Basic metabolic panel   EKG 62-IRSW    No orders of the defined types were placed in this encounter.    Patient Instructions  Medication Instructions:  No changes *If you need a refill on your cardiac medications before your next appointment, please call your pharmacy*   Lab Work: Your provider would like for you to have the following labs today: Lipid and TSH  If you have labs (blood work) drawn today and your tests are completely normal, you will receive your results only by: [place] (if you have MyChart) OR A paper copy in the mail If you have any lab test that is abnormal or we need to change your treatment, we will call you to review the results.  Testing/Procedures: No changes   Follow-Up: At [facility], you and your health needs are our priority.  As part of our continuing mission to provide you with exceptional heart care, we have created designated Provider Care Teams.  These Care Teams include your primary Cardiologist (physician) and Advanced Practice  Providers (APPs -  Physician Assistants and Nurse Practitioners) who all work together to provide you with the care you need, when you need it.  We recommend signing up for the patient portal called "MyChart".  Sign up information is provided on this After Visit Summary.  MyChart is used to connect with patients for Virtual Visits (Telemedicine).  Patients are able to view lab/test results, encounter notes, upcoming appointments, etc.  Non-urgent messages can be sent to your provider as well.   To learn more about what you can do with MyChart, go to [email or web address].    Your next appointment:   5 month(s)  The format for your next appointment:   In Person  Provider:   Sanda Klein, MD {   Important Information About Sugar         Signed, Sanda Klein, MD  10/02/2021 4:38 PM    [place]

## 2021-10-02 ENCOUNTER — Encounter: Payer: Self-pay | Admitting: Cardiovascular Disease

## 2021-10-03 ENCOUNTER — Other Ambulatory Visit: Payer: Self-pay

## 2021-10-03 DIAGNOSIS — E039 Hypothyroidism, unspecified: Secondary | ICD-10-CM

## 2021-10-03 DIAGNOSIS — E7800 Pure hypercholesterolemia, unspecified: Secondary | ICD-10-CM

## 2021-10-03 MED ORDER — LEVOTHYROXINE SODIUM 75 MCG PO TABS: 75.0000 ug | ORAL_TABLET | Freq: Every day | ORAL | 3 refills | Status: AC

## 2021-10-03 MED ORDER — EZETIMIBE 10 MG PO TABS: 10.0000 mg | ORAL_TABLET | Freq: Every day | ORAL | 3 refills | Status: AC

## 2021-10-15 NOTE — Addendum Note (Signed)
Addended by: Douglass Rivers D on: 10/04/2021 12:52 PM   Modules accepted: Level of Service

## 2021-10-15 NOTE — Progress Notes (Signed)
Remote ICD transmission.   

## 2021-10-22 DEATH — deceased

## 2021-11-11 ENCOUNTER — Other Ambulatory Visit: Payer: Self-pay | Admitting: Cardiovascular Disease

## 2021-11-11 DIAGNOSIS — I5022 Chronic systolic (congestive) heart failure: Secondary | ICD-10-CM

## 2022-03-10 ENCOUNTER — Ambulatory Visit: Payer: Self-pay | Admitting: Cardiovascular Disease

## 2022-11-15 IMAGING — DX DG ABDOMEN 1V
1 series · 1 of 1 positions shown · non-contrast
Comparison: None.

CLINICAL DATA: Percutaneous gastrotomy adjustment

EXAM:
ABDOMEN - 1 VIEW

[abdomen]
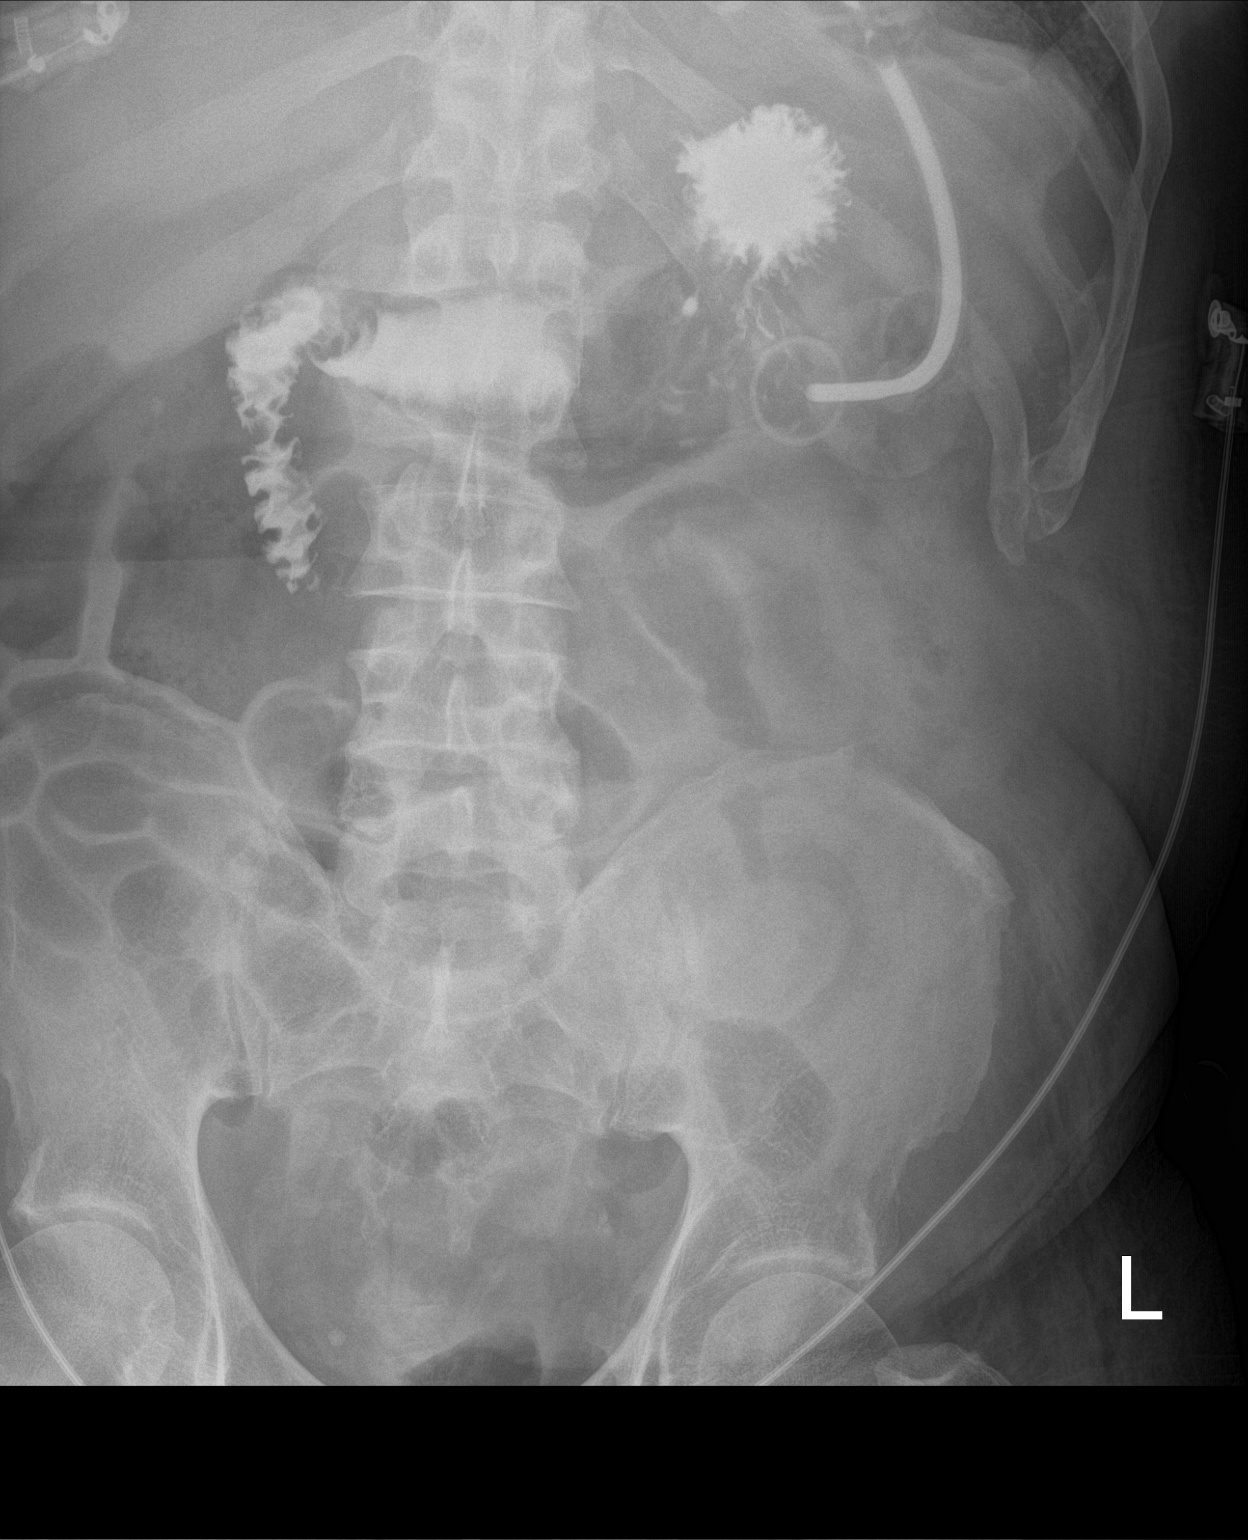

[1 of 1 positions shown; findings below may reference images not displayed]

FINDINGS: Injection of contrast is seen through the midbody percutaneous
gastrotomy tube. Contrast is seen within the stomach and proximal
duodenum. No definite contrast extravasation is seen. Air-filled
loops of are seen within the mid abdomen.
IMPRESSION: Contrast seen through the percutaneous gastrotomy tube within the
stomach duodenum. No definite contrast extravasation

## 2023-04-12 IMAGING — CR DG CHEST 2V
2 series · 2 of 2 positions shown · non-contrast
Comparison: 07/10/2020

CLINICAL DATA: Pleural effusion

EXAM:
CHEST - 2 VIEW

[w chest pa]
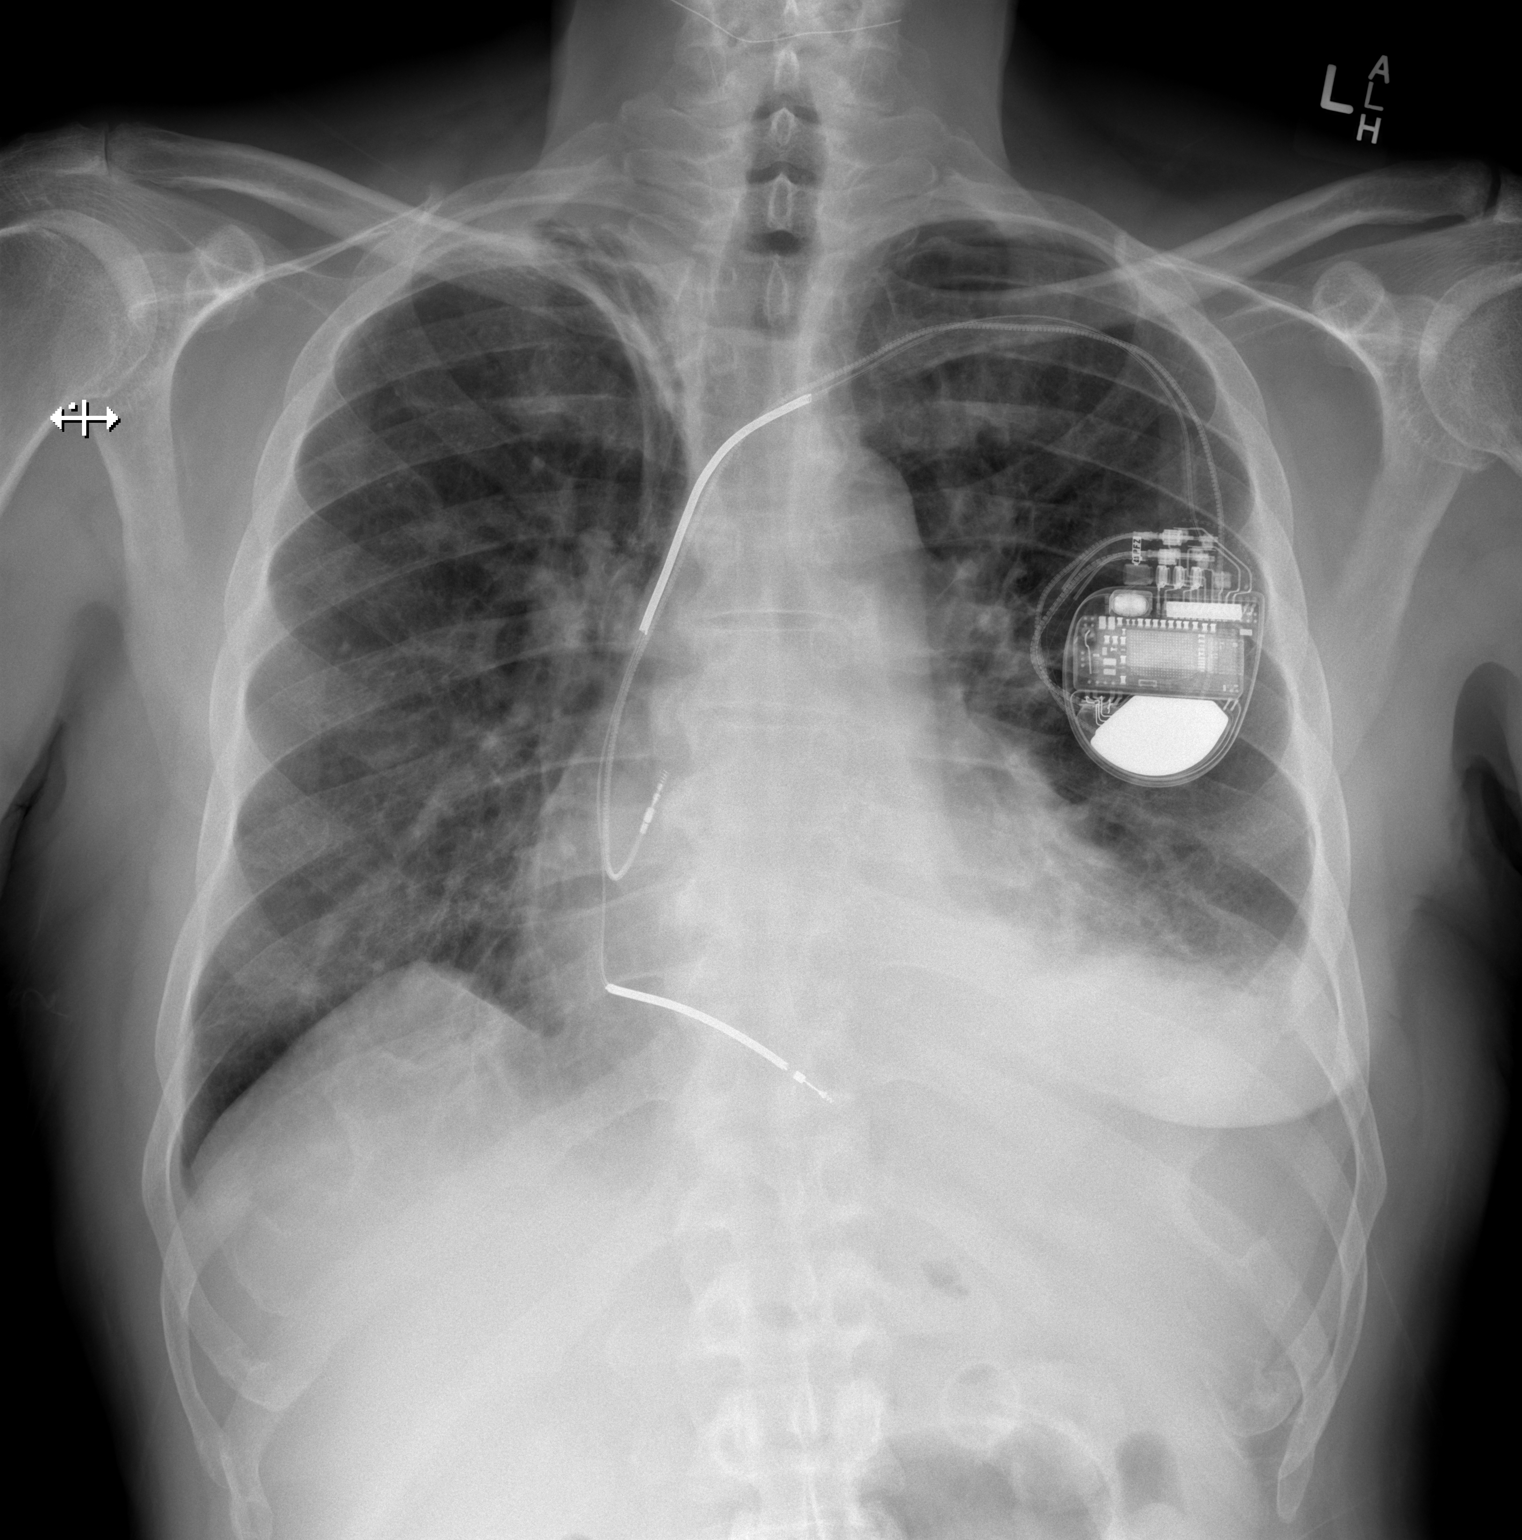

[w chest lat]
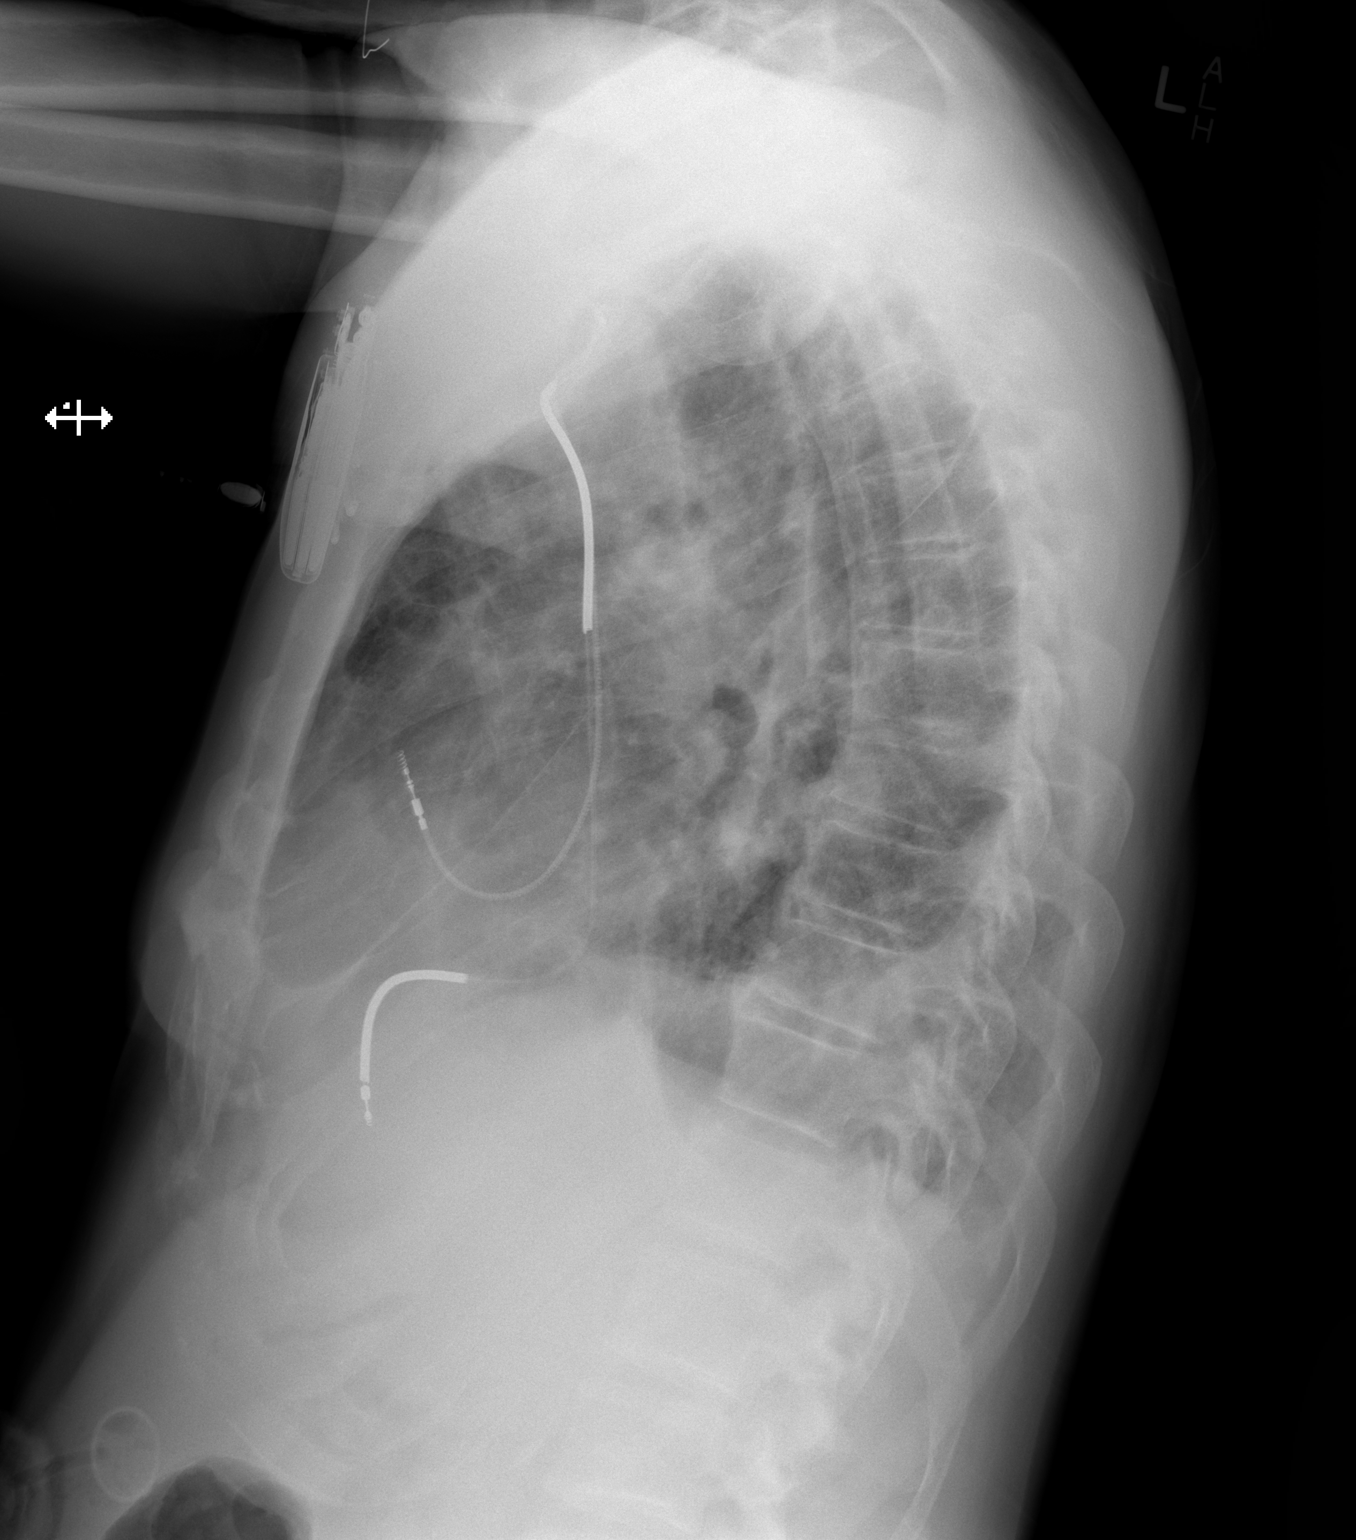

[2 of 2 positions shown; findings below may reference images not displayed]

FINDINGS: Persistent patchy bilateral interstitial and alveolar airspace
opacities. Small left pleural effusion. No right pleural effusion.
No pneumothorax. Right upper lobe scarring. Stable cardiomediastinal
silhouette. Dual lead cardiac pacemaker. No aggressive osseous
lesion.
IMPRESSION: Persistent patchy bilateral interstitial and alveolar airspace
opacities most concerning for interstitial edema. Small left pleural
effusion. Interval improvement compared with the prior exam.
